# Patient Record
Sex: Female | Born: 1935 | Race: White | Hispanic: No | Marital: Married | Smoking: Never smoker
Health system: Southern US, Community
[De-identification: ages and names within clinical notes are randomized; demographics above are authoritative.]

## PROBLEM LIST (undated history)

## (undated) DIAGNOSIS — M1990 Unspecified osteoarthritis, unspecified site: Secondary | ICD-10-CM

## (undated) DIAGNOSIS — I10 Essential (primary) hypertension: Secondary | ICD-10-CM

## (undated) DIAGNOSIS — E119 Type 2 diabetes mellitus without complications: Secondary | ICD-10-CM

## (undated) DIAGNOSIS — K219 Gastro-esophageal reflux disease without esophagitis: Secondary | ICD-10-CM

## (undated) DIAGNOSIS — E079 Disorder of thyroid, unspecified: Secondary | ICD-10-CM

## (undated) DIAGNOSIS — F32A Depression, unspecified: Secondary | ICD-10-CM

## (undated) DIAGNOSIS — E559 Vitamin D deficiency, unspecified: Secondary | ICD-10-CM

## (undated) DIAGNOSIS — G4730 Sleep apnea, unspecified: Secondary | ICD-10-CM

## (undated) DIAGNOSIS — F329 Major depressive disorder, single episode, unspecified: Secondary | ICD-10-CM

## (undated) DIAGNOSIS — D649 Anemia, unspecified: Secondary | ICD-10-CM

## (undated) HISTORY — PX: CHOLECYSTECTOMY: SHX55

## (undated) HISTORY — PX: BRAIN MENINGIOMA EXCISION: SHX576

## (undated) HISTORY — PX: TONSILLECTOMY: SUR1361

## (undated) HISTORY — PX: TRACHEOSTOMY: SUR1362

---

## 1998-01-24 ENCOUNTER — Other Ambulatory Visit: Admission: RE | Admit: 1998-01-24 | Discharge: 1998-01-24 | Payer: Self-pay | Admitting: Obstetrics and Gynecology

## 1998-06-03 ENCOUNTER — Encounter: Admission: RE | Admit: 1998-06-03 | Discharge: 1998-09-01 | Payer: Self-pay | Admitting: Gynecology

## 1999-02-06 ENCOUNTER — Ambulatory Visit (HOSPITAL_COMMUNITY): Admission: RE | Admit: 1999-02-06 | Discharge: 1999-02-06 | Payer: Self-pay | Admitting: Internal Medicine

## 1999-02-06 ENCOUNTER — Encounter: Payer: Self-pay | Admitting: Internal Medicine

## 1999-03-07 ENCOUNTER — Emergency Department (HOSPITAL_COMMUNITY): Admission: EM | Admit: 1999-03-07 | Discharge: 1999-03-07 | Payer: Self-pay | Admitting: Emergency Medicine

## 1999-03-07 ENCOUNTER — Encounter: Payer: Self-pay | Admitting: Emergency Medicine

## 1999-03-11 ENCOUNTER — Ambulatory Visit (HOSPITAL_COMMUNITY): Admission: RE | Admit: 1999-03-11 | Discharge: 1999-03-11 | Payer: Self-pay | Admitting: Internal Medicine

## 1999-03-11 ENCOUNTER — Encounter: Payer: Self-pay | Admitting: Internal Medicine

## 1999-05-06 ENCOUNTER — Encounter: Payer: Self-pay | Admitting: Internal Medicine

## 1999-05-06 ENCOUNTER — Ambulatory Visit (HOSPITAL_COMMUNITY): Admission: RE | Admit: 1999-05-06 | Discharge: 1999-05-06 | Payer: Self-pay | Admitting: Internal Medicine

## 1999-05-20 ENCOUNTER — Encounter: Admission: RE | Admit: 1999-05-20 | Discharge: 1999-08-18 | Payer: Self-pay | Admitting: Internal Medicine

## 1999-06-10 ENCOUNTER — Encounter: Payer: Self-pay | Admitting: Internal Medicine

## 1999-06-10 ENCOUNTER — Ambulatory Visit (HOSPITAL_COMMUNITY): Admission: RE | Admit: 1999-06-10 | Discharge: 1999-06-10 | Payer: Self-pay | Admitting: Internal Medicine

## 1999-06-28 ENCOUNTER — Emergency Department (HOSPITAL_COMMUNITY): Admission: EM | Admit: 1999-06-28 | Discharge: 1999-06-28 | Payer: Self-pay | Admitting: Emergency Medicine

## 1999-07-10 ENCOUNTER — Encounter: Admission: RE | Admit: 1999-07-10 | Discharge: 1999-09-25 | Payer: Self-pay | Admitting: Internal Medicine

## 1999-10-08 ENCOUNTER — Encounter: Admission: RE | Admit: 1999-10-08 | Discharge: 1999-10-08 | Payer: Self-pay | Admitting: Internal Medicine

## 1999-10-08 ENCOUNTER — Encounter: Payer: Self-pay | Admitting: Obstetrics and Gynecology

## 1999-11-05 ENCOUNTER — Other Ambulatory Visit: Admission: RE | Admit: 1999-11-05 | Discharge: 1999-11-05 | Payer: Self-pay | Admitting: Obstetrics and Gynecology

## 2000-02-02 ENCOUNTER — Encounter: Admission: RE | Admit: 2000-02-02 | Discharge: 2000-02-25 | Payer: Self-pay | Admitting: Internal Medicine

## 2000-04-29 ENCOUNTER — Ambulatory Visit (HOSPITAL_BASED_OUTPATIENT_CLINIC_OR_DEPARTMENT_OTHER): Admission: RE | Admit: 2000-04-29 | Discharge: 2000-04-29 | Payer: Self-pay | Admitting: Otolaryngology

## 2000-06-27 ENCOUNTER — Ambulatory Visit (HOSPITAL_BASED_OUTPATIENT_CLINIC_OR_DEPARTMENT_OTHER): Admission: RE | Admit: 2000-06-27 | Discharge: 2000-06-27 | Payer: Self-pay | Admitting: Otolaryngology

## 2000-11-16 ENCOUNTER — Other Ambulatory Visit: Admission: RE | Admit: 2000-11-16 | Discharge: 2000-11-16 | Payer: Self-pay | Admitting: Obstetrics and Gynecology

## 2000-11-24 ENCOUNTER — Encounter: Admission: RE | Admit: 2000-11-24 | Discharge: 2000-11-24 | Payer: Self-pay | Admitting: Obstetrics and Gynecology

## 2000-11-24 ENCOUNTER — Encounter: Payer: Self-pay | Admitting: Obstetrics and Gynecology

## 2001-08-16 ENCOUNTER — Ambulatory Visit (HOSPITAL_COMMUNITY): Admission: RE | Admit: 2001-08-16 | Discharge: 2001-08-16 | Payer: Self-pay | Admitting: Internal Medicine

## 2001-08-16 ENCOUNTER — Encounter: Payer: Self-pay | Admitting: Internal Medicine

## 2001-11-18 ENCOUNTER — Encounter: Payer: Self-pay | Admitting: Internal Medicine

## 2001-11-18 ENCOUNTER — Encounter: Admission: RE | Admit: 2001-11-18 | Discharge: 2001-11-18 | Payer: Self-pay | Admitting: Internal Medicine

## 2002-01-12 ENCOUNTER — Encounter: Admission: RE | Admit: 2002-01-12 | Discharge: 2002-01-12 | Payer: Self-pay | Admitting: Internal Medicine

## 2002-01-18 ENCOUNTER — Encounter: Admission: RE | Admit: 2002-01-18 | Discharge: 2002-01-18 | Payer: Self-pay | Admitting: Internal Medicine

## 2002-03-09 ENCOUNTER — Other Ambulatory Visit: Admission: RE | Admit: 2002-03-09 | Discharge: 2002-03-09 | Payer: Self-pay | Admitting: Internal Medicine

## 2002-06-07 ENCOUNTER — Ambulatory Visit (HOSPITAL_COMMUNITY): Admission: RE | Admit: 2002-06-07 | Discharge: 2002-06-07 | Payer: Self-pay | Admitting: *Deleted

## 2003-01-04 ENCOUNTER — Encounter: Admission: RE | Admit: 2003-01-04 | Discharge: 2003-01-04 | Payer: Self-pay | Admitting: Internal Medicine

## 2003-01-04 ENCOUNTER — Encounter: Payer: Self-pay | Admitting: Internal Medicine

## 2003-10-03 ENCOUNTER — Encounter: Admission: RE | Admit: 2003-10-03 | Discharge: 2003-10-03 | Payer: Self-pay | Admitting: Neurology

## 2003-11-15 ENCOUNTER — Ambulatory Visit (HOSPITAL_COMMUNITY): Admission: RE | Admit: 2003-11-15 | Discharge: 2003-11-15 | Payer: Self-pay | Admitting: Neurology

## 2003-12-07 ENCOUNTER — Encounter: Admission: RE | Admit: 2003-12-07 | Discharge: 2004-03-06 | Payer: Self-pay | Admitting: Neurology

## 2004-01-21 ENCOUNTER — Ambulatory Visit (HOSPITAL_COMMUNITY): Admission: RE | Admit: 2004-01-21 | Discharge: 2004-01-21 | Payer: Self-pay | Admitting: Internal Medicine

## 2004-10-07 ENCOUNTER — Encounter: Admission: RE | Admit: 2004-10-07 | Discharge: 2004-11-06 | Payer: Self-pay | Admitting: Orthopedic Surgery

## 2005-01-21 ENCOUNTER — Ambulatory Visit (HOSPITAL_COMMUNITY): Admission: RE | Admit: 2005-01-21 | Discharge: 2005-01-21 | Payer: Self-pay | Admitting: Internal Medicine

## 2005-06-02 ENCOUNTER — Ambulatory Visit (HOSPITAL_COMMUNITY): Admission: RE | Admit: 2005-06-02 | Discharge: 2005-06-02 | Payer: Self-pay | Admitting: *Deleted

## 2006-03-11 ENCOUNTER — Ambulatory Visit (HOSPITAL_COMMUNITY): Admission: RE | Admit: 2006-03-11 | Discharge: 2006-03-11 | Payer: Self-pay | Admitting: Internal Medicine

## 2007-03-25 ENCOUNTER — Ambulatory Visit (HOSPITAL_COMMUNITY): Admission: RE | Admit: 2007-03-25 | Discharge: 2007-03-25 | Payer: Self-pay | Admitting: Internal Medicine

## 2007-04-18 ENCOUNTER — Ambulatory Visit (HOSPITAL_COMMUNITY): Admission: RE | Admit: 2007-04-18 | Discharge: 2007-04-18 | Payer: Self-pay | Admitting: Internal Medicine

## 2008-03-20 ENCOUNTER — Encounter: Admission: RE | Admit: 2008-03-20 | Discharge: 2008-05-17 | Payer: Self-pay | Admitting: Internal Medicine

## 2008-05-09 ENCOUNTER — Ambulatory Visit (HOSPITAL_COMMUNITY): Admission: RE | Admit: 2008-05-09 | Discharge: 2008-05-09 | Payer: Self-pay | Admitting: Internal Medicine

## 2008-05-11 ENCOUNTER — Encounter: Admission: RE | Admit: 2008-05-11 | Discharge: 2008-05-11 | Payer: Self-pay | Admitting: Internal Medicine

## 2009-03-08 ENCOUNTER — Ambulatory Visit (HOSPITAL_COMMUNITY): Admission: RE | Admit: 2009-03-08 | Discharge: 2009-03-08 | Payer: Self-pay | Admitting: *Deleted

## 2009-03-12 ENCOUNTER — Ambulatory Visit (HOSPITAL_COMMUNITY): Admission: RE | Admit: 2009-03-12 | Discharge: 2009-03-12 | Payer: Self-pay | Admitting: *Deleted

## 2009-06-13 ENCOUNTER — Ambulatory Visit (HOSPITAL_COMMUNITY): Admission: RE | Admit: 2009-06-13 | Discharge: 2009-06-13 | Payer: Self-pay | Admitting: Internal Medicine

## 2009-06-21 ENCOUNTER — Encounter: Admission: RE | Admit: 2009-06-21 | Discharge: 2009-06-21 | Payer: Self-pay | Admitting: Internal Medicine

## 2010-10-26 ENCOUNTER — Encounter: Payer: Self-pay | Admitting: Internal Medicine

## 2011-01-12 LAB — BASIC METABOLIC PANEL
BUN: 9 mg/dL (ref 6–23)
CO2: 27 mEq/L (ref 19–32)
Calcium: 9 mg/dL (ref 8.4–10.5)
Chloride: 106 mEq/L (ref 96–112)
Creatinine, Ser: 0.7 mg/dL (ref 0.4–1.2)
GFR calc Af Amer: >60 mL/min (ref >60)
GFR calc non Af Amer: >60 mL/min (ref >60)
Glucose, Bld: 111 mg/dL — ABNORMAL HIGH (ref 70–99)
Potassium: 4.5 mEq/L (ref 3.5–5.1)
Sodium: 142 mEq/L (ref 135–145)

## 2011-01-12 LAB — GLUCOSE, CAPILLARY: Glucose-Capillary: 113 mg/dL — ABNORMAL HIGH (ref 70–99)

## 2011-02-17 NOTE — Op Note (Signed)
NAME:  Sonya Garrett, HABERLE NO.:  [id number]   MEDICAL RECORD NO.:  [id number]          PATIENT TYPE:  AMB   LOCATION:  ENDO                         FACILITY:  [facility]   PHYSICIAN:  Georgiana Spinner, M.D.    DATE OF BIRTH:  [birth year]   DATE OF PROCEDURE:  DATE OF DISCHARGE:                               OPERATIVE REPORT   PROCEDURE:  Upper endoscopy.   INDICATIONS:  Abdominal discomfort, abdominal fullness.   ANESTHESIA:  Fentanyl 35 mcg, Versed 3.5 mg.   PROCEDURE:  With the patient mildly sedated in the left lateral  decubitus position the Pentax videoscopic endoscope was inserted in the  mouth and passed under direct vision to the hypopharynx.  I could not  advance it further so I withdrew the endoscope and then subsequently  substituted the Pentax videoscopic pediatric endoscope which I was then  able to advance and pass under direct vision into the esophagus and  advanced distally.  The esophagus appeared normal and we entered into  the stomach.  The fundus and body appeared normal and I was following  the folds to what I thought was originally the pylorus, but it was  stenosed, but in fact this was a large diverticulum in the folds.  I  photographed subsequently only.  I pulled the endoscope up and placed it  in retroflexion.  I viewed the stomach from below and then straightened  and advanced and found the pylorus, then superior to this level and  advanced to duodenal bulb and second portion of the duodenum.  From this  point, the endoscope was slowly withdrawn taking circumferential views  of duodenal mucosa until the endoscope had been pulled back into the  stomach and then subsequently withdrawn taking circumferential views of  the remaining gastric and esophageal mucosa.  The patient's vital signs  and pulse oximeter remained stable.  The patient tolerated the procedure  well with no apparent complication.   FINDINGS:  A large diverticulum in the body and  fundus of the stomach  that goes to the greater curvature, but no mass is seen or ulceration  noted.   IMPRESSION:  A large gastric diverticulum.  We will want to get further  studies, such as possible CAT scan if this has not been done to evaluate  further and have the patient follow-up with me as an outpatient.           ______________________________  Georgiana Spinner, M.D.     GMO/MEDQ  D:  03/08/2009  T:  03/08/2009  Job:  [id number]

## 2011-02-20 NOTE — Op Note (Signed)
NAME:  Sonya Garrett, VROOMAN NO.:  [id number]   MEDICAL RECORD NO.:  [id number]          PATIENT TYPE:  AMB   LOCATION:  ENDO                         FACILITY:  [facility]   PHYSICIAN:  Georgiana Spinner, M.D.    DATE OF BIRTH:  [birth year]   DATE OF PROCEDURE:  06/02/2005  DATE OF DISCHARGE:                                 OPERATIVE REPORT   PROCEDURE:  Colonoscopy.   INDICATIONS:  Rectal bleeding.   ANESTHESIA:  Demerol 60 milligrams, Versed 6 milligrams.   PROCEDURE:  With the patient mildly sedated in the left lateral decubitus  position, the Olympus videoscopic colonoscope PCF-160 was inserted in the  rectum and passed under direct vision to the hepatic flexure. Despite  turning with pressure, we could not advance the scope past this point, so  therefore, it was withdrawn and subsequently, the Olympus videoscopic  colonoscope adult adjustable colonoscope was inserted in the rectum and  passed under direct vision with pressure applied.  We reached the cecum  identified by ileocecal valve and appendiceal orifice, both of which were  photographed.  From this point, the colonoscope was slowly withdrawn taking  circumferential views of colonic mucosa noting that the prep was somewhat  substandard in that there were areas of thick brown liquid stool that had to  be suctioned, but could not be completely suctioned, but it was clearly  brown opaque material, but no gross lesions were seen as we withdrew all the  way to the rectum which appeared normal on direct view showed hemorrhoids on  retroflex view.  The endoscope was straightened and withdrawn through the  anal canal.  Of note, there were external hemorrhoids and skin tags.  __________previously.  The patient's vital signs and pulse oximeter remained  stable. The patient tolerated the procedure well without apparent  complications.   FINDINGS:  Internal and external hemorrhoids, question of a small fissure  seen as well.   Otherwise unremarkable examination limited somewhat by poor  prep.   PLAN:  The patient will follow up with me on an as needed basis.           ______________________________  Georgiana Spinner, M.D.     GMO/MEDQ  D:  06/02/2005  T:  06/02/2005  Job:  [id number]

## 2012-11-15 LAB — TSH: TSH: 1.97 u[IU]/mL (ref <=5.90)

## 2012-11-18 ENCOUNTER — Inpatient Hospital Stay (HOSPITAL_COMMUNITY)
Admission: EM | Admit: 2012-11-18 | Discharge: 2012-11-25 | DRG: 193 | Disposition: A | Discharge status: Nursing Home (Includes ICF) | Payer: Medicare Other | Source: Ambulatory Visit | Attending: Internal Medicine | Admitting: Internal Medicine

## 2012-11-18 ENCOUNTER — Encounter (HOSPITAL_COMMUNITY): Payer: Self-pay | Admitting: Emergency Medicine

## 2012-11-18 DIAGNOSIS — D638 Anemia in other chronic diseases classified elsewhere: Secondary | ICD-10-CM | POA: Diagnosis present

## 2012-11-18 DIAGNOSIS — Z792 Long term (current) use of antibiotics: Secondary | ICD-10-CM

## 2012-11-18 DIAGNOSIS — Z6831 Body mass index (BMI) 31.0-31.9, adult: Secondary | ICD-10-CM

## 2012-11-18 DIAGNOSIS — E669 Obesity, unspecified: Secondary | ICD-10-CM | POA: Diagnosis present

## 2012-11-18 DIAGNOSIS — E1140 Type 2 diabetes mellitus with diabetic neuropathy, unspecified: Secondary | ICD-10-CM | POA: Diagnosis present

## 2012-11-18 DIAGNOSIS — J180 Bronchopneumonia, unspecified organism: Secondary | ICD-10-CM | POA: Diagnosis present

## 2012-11-18 DIAGNOSIS — F3289 Other specified depressive episodes: Secondary | ICD-10-CM | POA: Diagnosis present

## 2012-11-18 DIAGNOSIS — I10 Essential (primary) hypertension: Secondary | ICD-10-CM | POA: Diagnosis present

## 2012-11-18 DIAGNOSIS — M1990 Unspecified osteoarthritis, unspecified site: Secondary | ICD-10-CM | POA: Diagnosis present

## 2012-11-18 DIAGNOSIS — K5900 Constipation, unspecified: Secondary | ICD-10-CM | POA: Diagnosis not present

## 2012-11-18 DIAGNOSIS — J45909 Unspecified asthma, uncomplicated: Secondary | ICD-10-CM | POA: Diagnosis present

## 2012-11-18 DIAGNOSIS — Z9089 Acquired absence of other organs: Secondary | ICD-10-CM

## 2012-11-18 DIAGNOSIS — E559 Vitamin D deficiency, unspecified: Secondary | ICD-10-CM | POA: Diagnosis present

## 2012-11-18 DIAGNOSIS — G4733 Obstructive sleep apnea (adult) (pediatric): Secondary | ICD-10-CM | POA: Diagnosis present

## 2012-11-18 DIAGNOSIS — R5381 Other malaise: Secondary | ICD-10-CM | POA: Diagnosis present

## 2012-11-18 DIAGNOSIS — J189 Pneumonia, unspecified organism: Principal | ICD-10-CM | POA: Diagnosis present

## 2012-11-18 DIAGNOSIS — F329 Major depressive disorder, single episode, unspecified: Secondary | ICD-10-CM | POA: Diagnosis present

## 2012-11-18 DIAGNOSIS — R7401 Elevation of levels of liver transaminase levels: Secondary | ICD-10-CM | POA: Diagnosis present

## 2012-11-18 DIAGNOSIS — K219 Gastro-esophageal reflux disease without esophagitis: Secondary | ICD-10-CM | POA: Diagnosis present

## 2012-11-18 DIAGNOSIS — E876 Hypokalemia: Secondary | ICD-10-CM | POA: Diagnosis not present

## 2012-11-18 DIAGNOSIS — N390 Urinary tract infection, site not specified: Secondary | ICD-10-CM | POA: Diagnosis present

## 2012-11-18 DIAGNOSIS — Z86011 Personal history of benign neoplasm of the brain: Secondary | ICD-10-CM

## 2012-11-18 DIAGNOSIS — F0390 Unspecified dementia without behavioral disturbance: Secondary | ICD-10-CM | POA: Diagnosis present

## 2012-11-18 DIAGNOSIS — J9600 Acute respiratory failure, unspecified whether with hypoxia or hypercapnia: Secondary | ICD-10-CM | POA: Diagnosis present

## 2012-11-18 DIAGNOSIS — D72829 Elevated white blood cell count, unspecified: Secondary | ICD-10-CM | POA: Diagnosis present

## 2012-11-18 DIAGNOSIS — Z79899 Other long term (current) drug therapy: Secondary | ICD-10-CM

## 2012-11-18 DIAGNOSIS — E039 Hypothyroidism, unspecified: Secondary | ICD-10-CM | POA: Diagnosis present

## 2012-11-18 DIAGNOSIS — R7402 Elevation of levels of lactic acid dehydrogenase (LDH): Secondary | ICD-10-CM | POA: Diagnosis present

## 2012-11-18 DIAGNOSIS — E119 Type 2 diabetes mellitus without complications: Secondary | ICD-10-CM | POA: Diagnosis present

## 2012-11-18 HISTORY — DX: Vitamin D deficiency, unspecified: E55.9

## 2012-11-18 HISTORY — DX: Type 2 diabetes mellitus without complications: E11.9

## 2012-11-18 HISTORY — DX: Depression, unspecified: F32.A

## 2012-11-18 HISTORY — DX: Anemia, unspecified: D64.9

## 2012-11-18 HISTORY — DX: Gastro-esophageal reflux disease without esophagitis: K21.9

## 2012-11-18 HISTORY — DX: Major depressive disorder, single episode, unspecified: F32.9

## 2012-11-18 HISTORY — DX: Essential (primary) hypertension: I10

## 2012-11-18 HISTORY — DX: Unspecified osteoarthritis, unspecified site: M19.90

## 2012-11-18 HISTORY — DX: Sleep apnea, unspecified: G47.30

## 2012-11-18 HISTORY — DX: Disorder of thyroid, unspecified: E07.9

## 2012-11-18 LAB — URINALYSIS, ROUTINE W REFLEX MICROSCOPIC
Glucose, UA: 250 mg/dL — AB
Ketones, ur: 15 mg/dL — AB
Nitrite: NEGATIVE
Protein, ur: 30 mg/dL — AB
Specific Gravity, Urine: 1.023 (ref 1.005–1.030)
Urobilinogen, UA: 1 mg/dL (ref 0.0–1.0)
pH: 5.5 (ref 5.0–8.0)

## 2012-11-18 LAB — URINE MICROSCOPIC-ADD ON

## 2012-11-18 MED ORDER — SODIUM CHLORIDE 0.9 % IV SOLN: INTRAVENOUS | Status: DC

## 2012-11-18 NOTE — ED Notes (Signed)
Patient from [facility] at [place].  Per EMS SNF staff sent patient to ED for uti after drawing lab work, patient was also hyperglycemic 287 with EMS and a fever of 100.4.  Patient received 650 of tylenol PO at SNF.  Patient is denying abdominal pain at this time.

## 2012-11-19 ENCOUNTER — Emergency Department (HOSPITAL_COMMUNITY): Payer: Medicare Other

## 2012-11-19 ENCOUNTER — Encounter (HOSPITAL_COMMUNITY): Payer: Self-pay | Admitting: Internal Medicine

## 2012-11-19 DIAGNOSIS — J189 Pneumonia, unspecified organism: Principal | ICD-10-CM

## 2012-11-19 DIAGNOSIS — D72829 Elevated white blood cell count, unspecified: Secondary | ICD-10-CM | POA: Diagnosis present

## 2012-11-19 DIAGNOSIS — E1140 Type 2 diabetes mellitus with diabetic neuropathy, unspecified: Secondary | ICD-10-CM | POA: Diagnosis present

## 2012-11-19 DIAGNOSIS — E119 Type 2 diabetes mellitus without complications: Secondary | ICD-10-CM

## 2012-11-19 DIAGNOSIS — I10 Essential (primary) hypertension: Secondary | ICD-10-CM | POA: Diagnosis present

## 2012-11-19 DIAGNOSIS — E669 Obesity, unspecified: Secondary | ICD-10-CM | POA: Diagnosis present

## 2012-11-19 DIAGNOSIS — N390 Urinary tract infection, site not specified: Secondary | ICD-10-CM

## 2012-11-19 LAB — CBC WITH DIFFERENTIAL/PLATELET
Eosinophils Absolute: 0 10*3/uL (ref 0.0–0.7)
HCT: 32.3 % — ABNORMAL LOW (ref 36.0–46.0)
Hemoglobin: 11 g/dL — ABNORMAL LOW (ref 12.0–15.0)
Lymphs Abs: 0.4 10*3/uL — ABNORMAL LOW (ref 0.7–4.0)
MCH: 28.4 pg (ref 26.0–34.0)
Monocytes Relative: 2 % — ABNORMAL LOW (ref 3–12)
Neutro Abs: 11.4 10*3/uL — ABNORMAL HIGH (ref 1.7–7.7)
Neutrophils Relative %: 94 % — ABNORMAL HIGH (ref 43–77)
RBC: 3.87 MIL/uL (ref 3.87–5.11)

## 2012-11-19 LAB — GLUCOSE, CAPILLARY
Glucose-Capillary: 145 mg/dL — ABNORMAL HIGH (ref 70–99)
Glucose-Capillary: 171 mg/dL — ABNORMAL HIGH (ref 70–99)

## 2012-11-19 LAB — COMPREHENSIVE METABOLIC PANEL WITH GFR
ALT: 187 U/L — ABNORMAL HIGH (ref 0–35)
AST: 171 U/L — ABNORMAL HIGH (ref 0–37)
Albumin: 2.8 g/dL — ABNORMAL LOW (ref 3.5–5.2)
Alkaline Phosphatase: 165 U/L — ABNORMAL HIGH (ref 39–117)
BUN: 20 mg/dL (ref 6–23)
CO2: 25 meq/L (ref 19–32)
Calcium: 8.5 mg/dL (ref 8.4–10.5)
Chloride: 98 meq/L (ref 96–112)
Creatinine, Ser: 0.86 mg/dL (ref 0.50–1.10)
GFR calc Af Amer: 71 mL/min — ABNORMAL LOW
GFR calc non Af Amer: 62 mL/min — ABNORMAL LOW
Glucose, Bld: 181 mg/dL — ABNORMAL HIGH (ref 70–99)
Potassium: 3.8 meq/L (ref 3.5–5.1)
Sodium: 135 meq/L (ref 135–145)
Total Bilirubin: 0.6 mg/dL (ref 0.3–1.2)
Total Protein: 6.7 g/dL (ref 6.0–8.3)

## 2012-11-19 LAB — CBC
Hemoglobin: 10.5 g/dL — ABNORMAL LOW (ref 12.0–15.0)
MCH: 27.8 pg (ref 26.0–34.0)
MCV: 83.1 fL (ref 78.0–100.0)
Platelets: 169 10*3/uL (ref 150–400)
RBC: 3.78 MIL/uL — ABNORMAL LOW (ref 3.87–5.11)

## 2012-11-19 LAB — BASIC METABOLIC PANEL
CO2: 26 mEq/L (ref 19–32)
Calcium: 8.4 mg/dL (ref 8.4–10.5)
Glucose, Bld: 70 mg/dL (ref 70–99)
Sodium: 136 mEq/L (ref 135–145)

## 2012-11-19 MED ORDER — METFORMIN HCL 500 MG PO TABS
500.0000 mg | ORAL_TABLET | Freq: Two times a day (BID) | ORAL | Status: DC
  Filled 2012-11-19 (×3): qty 1

## 2012-11-19 MED ORDER — ENOXAPARIN SODIUM 40 MG/0.4ML ~~LOC~~ SOLN
40.0000 mg | SUBCUTANEOUS | Status: DC
  Administered 2012-11-19 – 2012-11-24 (×6): 40 mg via SUBCUTANEOUS
  Filled 2012-11-19 (×7): qty 0.4

## 2012-11-19 MED ORDER — SODIUM CHLORIDE 0.9 % IV SOLN: INTRAVENOUS | Status: DC

## 2012-11-19 MED ORDER — FLUOXETINE HCL 20 MG PO CAPS: 20.0000 mg | ORAL_CAPSULE | Freq: Every day | ORAL | Status: DC

## 2012-11-19 MED ORDER — MEMANTINE HCL 10 MG PO TABS
10.0000 mg | ORAL_TABLET | Freq: Every day | ORAL | Status: DC
  Administered 2012-11-19 – 2012-11-25 (×7): 10 mg via ORAL
  Filled 2012-11-19 (×7): qty 1

## 2012-11-19 MED ORDER — PREGABALIN 75 MG PO CAPS
75.0000 mg | ORAL_CAPSULE | Freq: Three times a day (TID) | ORAL | Status: DC
  Administered 2012-11-19 – 2012-11-25 (×19): 75 mg via ORAL
  Filled 2012-11-19 (×19): qty 1

## 2012-11-19 MED ORDER — PANTOPRAZOLE SODIUM 40 MG PO TBEC
40.0000 mg | DELAYED_RELEASE_TABLET | Freq: Every day | ORAL | Status: DC
  Administered 2012-11-19 – 2012-11-25 (×7): 40 mg via ORAL
  Filled 2012-11-19 (×7): qty 1

## 2012-11-19 MED ORDER — ONDANSETRON HCL 4 MG PO TABS: 4.0000 mg | ORAL_TABLET | Freq: Four times a day (QID) | ORAL | Status: DC | PRN

## 2012-11-19 MED ORDER — ALBUTEROL SULFATE (5 MG/ML) 0.5% IN NEBU
2.5000 mg | INHALATION_SOLUTION | Freq: Four times a day (QID) | RESPIRATORY_TRACT | Status: DC
  Administered 2012-11-19 (×4): 2.5 mg via RESPIRATORY_TRACT
  Filled 2012-11-19 (×4): qty 0.5

## 2012-11-19 MED ORDER — GLIPIZIDE 5 MG PO TABS
5.0000 mg | ORAL_TABLET | Freq: Every day | ORAL | Status: DC
  Filled 2012-11-19 (×2): qty 1

## 2012-11-19 MED ORDER — ALBUTEROL SULFATE (5 MG/ML) 0.5% IN NEBU
2.5000 mg | INHALATION_SOLUTION | Freq: Three times a day (TID) | RESPIRATORY_TRACT | Status: DC
  Administered 2012-11-20 – 2012-11-22 (×7): 2.5 mg via RESPIRATORY_TRACT
  Filled 2012-11-19 (×6): qty 0.5

## 2012-11-19 MED ORDER — ACETAMINOPHEN 650 MG RE SUPP: 650.0000 mg | Freq: Four times a day (QID) | RECTAL | Status: DC | PRN

## 2012-11-19 MED ORDER — LEVOTHYROXINE SODIUM 50 MCG PO TABS
50.0000 ug | ORAL_TABLET | Freq: Every day | ORAL | Status: DC
  Administered 2012-11-19 – 2012-11-25 (×7): 50 ug via ORAL
  Filled 2012-11-19 (×8): qty 1

## 2012-11-19 MED ORDER — INSULIN ASPART 100 UNIT/ML ~~LOC~~ SOLN
0.0000 [IU] | Freq: Three times a day (TID) | SUBCUTANEOUS | Status: DC
  Administered 2012-11-19: 1 [IU] via SUBCUTANEOUS
  Administered 2012-11-20: 2 [IU] via SUBCUTANEOUS
  Administered 2012-11-20: 1 [IU] via SUBCUTANEOUS
  Administered 2012-11-20 – 2012-11-21 (×2): 2 [IU] via SUBCUTANEOUS
  Administered 2012-11-21 (×2): 1 [IU] via SUBCUTANEOUS
  Administered 2012-11-22: 5 [IU] via SUBCUTANEOUS
  Administered 2012-11-22: 3 [IU] via SUBCUTANEOUS
  Administered 2012-11-22: 1 [IU] via SUBCUTANEOUS
  Administered 2012-11-23: 7 [IU] via SUBCUTANEOUS
  Administered 2012-11-23: 9 [IU] via SUBCUTANEOUS
  Administered 2012-11-23: 7 [IU] via SUBCUTANEOUS
  Administered 2012-11-24: 2 [IU] via SUBCUTANEOUS
  Administered 2012-11-24: 1 [IU] via SUBCUTANEOUS
  Administered 2012-11-24: 5 [IU] via SUBCUTANEOUS
  Administered 2012-11-25: 3 [IU] via SUBCUTANEOUS
  Administered 2012-11-25: 2 [IU] via SUBCUTANEOUS

## 2012-11-19 MED ORDER — ONDANSETRON HCL 4 MG/2ML IJ SOLN: 4.0000 mg | Freq: Four times a day (QID) | INTRAMUSCULAR | Status: DC | PRN

## 2012-11-19 MED ORDER — OXYCODONE HCL 5 MG PO TABS: 5.0000 mg | ORAL_TABLET | ORAL | Status: DC | PRN

## 2012-11-19 MED ORDER — DEXTROSE 5 % IV SOLN
500.0000 mg | INTRAVENOUS | Status: DC
  Administered 2012-11-19 – 2012-11-23 (×5): 500 mg via INTRAVENOUS
  Filled 2012-11-19 (×6): qty 500

## 2012-11-19 MED ORDER — BIOTENE DRY MOUTH MT LIQD
15.0000 mL | Freq: Two times a day (BID) | OROMUCOSAL | Status: DC
  Administered 2012-11-19 – 2012-11-25 (×10): 15 mL via OROMUCOSAL

## 2012-11-19 MED ORDER — DEXTROSE 5 % IV SOLN
1.0000 g | INTRAVENOUS | Status: DC
  Administered 2012-11-19 – 2012-11-25 (×7): 1 g via INTRAVENOUS
  Filled 2012-11-19 (×7): qty 10

## 2012-11-19 MED ORDER — ACETAMINOPHEN 325 MG PO TABS
650.0000 mg | ORAL_TABLET | Freq: Four times a day (QID) | ORAL | Status: DC | PRN
  Administered 2012-11-24 – 2012-11-25 (×2): 650 mg via ORAL
  Filled 2012-11-19 (×2): qty 2

## 2012-11-19 MED ORDER — ZOLPIDEM TARTRATE 5 MG PO TABS
5.0000 mg | ORAL_TABLET | Freq: Every evening | ORAL | Status: DC | PRN
  Administered 2012-11-20: 5 mg via ORAL
  Filled 2012-11-19: qty 1

## 2012-11-19 MED ORDER — HYDROMORPHONE HCL PF 1 MG/ML IJ SOLN: 0.5000 mg | INTRAMUSCULAR | Status: DC | PRN

## 2012-11-19 MED ORDER — SODIUM CHLORIDE 0.9 % IV SOLN
INTRAVENOUS | Status: DC
  Administered 2012-11-19: 02:00:00 via INTRAVENOUS
  Administered 2012-11-20: 10 mL/h via INTRAVENOUS
  Administered 2012-11-22: 11:00:00 via INTRAVENOUS

## 2012-11-19 MED ORDER — LEVOFLOXACIN IN D5W 750 MG/150ML IV SOLN: 750.0000 mg | INTRAVENOUS | Status: DC

## 2012-11-19 MED ORDER — LEVOFLOXACIN IN D5W 750 MG/150ML IV SOLN
750.0000 mg | INTRAVENOUS | Status: DC
  Administered 2012-11-19: 750 mg via INTRAVENOUS
  Filled 2012-11-19: qty 150

## 2012-11-19 MED ORDER — INSULIN ASPART 100 UNIT/ML ~~LOC~~ SOLN
0.0000 [IU] | Freq: Every day | SUBCUTANEOUS | Status: DC
  Administered 2012-11-22 – 2012-11-23 (×2): 3 [IU] via SUBCUTANEOUS
  Administered 2012-11-24: 2 [IU] via SUBCUTANEOUS

## 2012-11-19 MED ORDER — ALBUTEROL SULFATE (5 MG/ML) 0.5% IN NEBU
2.5000 mg | INHALATION_SOLUTION | RESPIRATORY_TRACT | Status: DC | PRN
  Administered 2012-11-20: 2.5 mg via RESPIRATORY_TRACT
  Filled 2012-11-19 (×2): qty 0.5

## 2012-11-19 NOTE — ED Notes (Signed)
[phone] Marcella Dubs (daughter) Please call when patient gets bed placement.

## 2012-11-19 NOTE — H&P (Signed)
[facility] History and Physical  EMANII BUGBEE ZOX:[id number] DOB: 27-Feb-1929 DOA: 11/18/2012  Referring physician:  EDP PCP: Kimber Relic, MD  Specialists:   Chief Complaint: Sick, nauseous, and weak  HPI: Sonya Garrett is a 79 y.o. female resident of [facility] who was sent to the ED with complaints of feeling sick, nauseous and weak over the past 4 days.   She reports having a cough and chest congestion which has worsened over the past week, and she was diagnosed with a UTI and placed on outpatient therapy of Macrobid, and then started on Cipro 1 day ago.   She does report having fevers and chills.   In the ED she was  Found to have a developing pneumonia on chest X-ray, and her urinalysis was found to be positive as well.  She was placed on IV Levaquin and referred for admission.      Review of Systems: The patient denies anorexia, weight loss, vision loss, decreased hearing, hoarseness, chest pain, syncope, dyspnea on exertion, peripheral edema, balance deficits, hemoptysis, abdominal pain, diarrhea, constipation, hematemesis, melena, hematochezia, severe indigestion/heartburn, hematuria, incontinence, muscle weakness, suspicious skin lesions, transient blindness, depression, unusual weight change, abnormal bleeding, enlarged lymph nodes, angioedema, and breast masses.    Past Medical History  Diagnosis Date  . Thyroid disease hypotyroidism  . Diabetes mellitus without complication   . Anemia   . Depression   . Vitamin D deficiency   . Sleep apnea   . Hypertension   . Osteoarthritis   . GERD (gastroesophageal reflux disease)     Past Surgical History:         Resection of Meningioma, and S/P Tracheostomy      Cholecystectomy      Tonsillectomy   Medications:  HOME MEDS: Prior to Admission medications   Medication Sig Start Date End Date Taking? Authorizing Provider  acetaminophen (TYLENOL) 325 MG tablet Take 650 mg by mouth every 6 (six) hours as needed  for pain.   Yes Historical Provider, MD  calcium carbonate (TUMS - DOSED IN MG ELEMENTAL CALCIUM) 500 MG chewable tablet Chew 1 tablet by mouth 2 (two) times daily as needed for heartburn.   Yes Historical Provider, MD  Cholecalciferol (VITAMIN D-3) 1000 UNITS CAPS Take 1,000 Units by mouth daily.   Yes Historical Provider, MD  ciprofloxacin (CIPRO) 500 MG tablet Take 500 mg by mouth 2 (two) times daily. Started on 11-18-12;  7 day course of therapy   Yes Historical Provider, MD  FLUoxetine (PROZAC) 20 MG/5ML solution Take 20 mg by mouth every evening.   Yes Historical Provider, MD  glipiZIDE (GLUCOTROL) 5 MG tablet Take 5 mg by mouth 2 (two) times daily before a meal.   Yes Historical Provider, MD  hydroxypropyl methylcellulose (ISOPTO TEARS) 2.5 % ophthalmic solution Place 1 drop into both eyes 2 (two) times daily.   Yes Historical Provider, MD  levothyroxine (SYNTHROID, LEVOTHROID) 50 MCG tablet Take 50 mcg by mouth daily.   Yes Historical Provider, MD  memantine (NAMENDA) 10 MG tablet Take 10 mg by mouth 2 (two) times daily.   Yes Historical Provider, MD  metFORMIN (GLUCOPHAGE) 500 MG tablet Take 500 mg by mouth 2 (two) times daily with a meal.   Yes Historical Provider, MD  nystatin (MYCOSTATIN/NYSTOP) 100000 UNIT/GM POWD Apply 1 g topically 2 (two) times daily as needed (applied to skin folds twice daily as needed for rash).   Yes Historical Provider, MD  nystatin-triamcinolone (MYCOLOG II) cream Apply  1 application topically at bedtime. To perineal area   Yes Historical Provider, MD  omeprazole (PRILOSEC) 20 MG capsule Take 20 mg by mouth daily.   Yes Historical Provider, MD  polyethylene glycol (MIRALAX / GLYCOLAX) packet Take 17 g by mouth daily.   Yes Historical Provider, MD  pregabalin (LYRICA) 75 MG capsule Take 75 mg by mouth at bedtime.   Yes Historical Provider, MD  promethazine (PHENERGAN) 25 MG tablet Take 25 mg by mouth every 4 (four) hours as needed for nausea.   Yes Historical  Provider, MD  saccharomyces boulardii (FLORASTOR) 250 MG capsule Take 250 mg by mouth 2 (two) times daily.   Yes Historical Provider, MD  senna-docusate (SENOKOT-S) 8.6-50 MG per tablet Take 2 tablets by mouth every other day.   Yes Historical Provider, MD  vitamin B-12 (CYANOCOBALAMIN) 1000 MCG tablet Take 1,000 mcg by mouth every evening.   Yes Historical Provider, MD    Allergies:  Allergies  Allergen Reactions  . Codeine Phosphate     unknown  . Darvocet (Propoxyphene-Acetaminophen)     unknown  . Ibuprofen     unknown  . Prozac (Fluoxetine Hcl)     unknown  . Reglan (Metoclopramide)     unknown  . Tofranil (Imipramine Hcl)     unknown    Social History:  Lives at the [facility].    reports that she has never smoked. She has never used smokeless tobacco. She reports that she does not drink alcohol or use illicit drugs.  Family History:            Diabetes in Mother   Physical Exam:  GEN:  Pleasant Elderly Obese 79 year old Caucasian Female examined  and in no acute distress; cooperative with exam Filed Vitals:   11/18/12 2214 11/18/12 2308  BP: 108/48 109/46  Pulse: 73 73  Temp: 100.8 F (38.2 C)   TempSrc: Oral   Resp: 16   SpO2: 94% 95%   Blood pressure 109/46, pulse 73, temperature 100.8 F (38.2 C), temperature source Oral, resp. rate 16, SpO2 95.00%. PSYCH: She is alert and oriented x4; does not appear anxious does not appear depressed; affect is normal HEENT: Normocephalic and Atraumatic, Mucous membranes pink; PERRLA; EOM intact; Fundi:  Benign;  No scleral icterus, Nares: Patent, Oropharynx: Clear, Poor Dentition, Neck:   +Trachestomy scar.  FROM, no cervical lymphadenopathy nor thyromegaly or carotid bruit; no JVD; Breasts:: Not examined CHEST WALL: No tenderness CHEST: Normal respiration, diffuse Rhonchi Bilaterally No rales No wheezes.   HEART: Regular rate and rhythm; no murmurs rubs or gallops BACK: No kyphosis or scoliosis; no CVA  tenderness ABDOMEN: Positive Bowel Sounds,Obese, soft non-tender; no masses, no organomegaly, no pannus; no intertriginous candida. Rectal Exam: Not done EXTREMITIES: No bone or joint deformity; age-appropriate arthropathy of the hands and knees; no cyanosis, clubbing or edema; no ulcerations. Genitalia: not examined PULSES: 2+ and symmetric SKIN: Normal hydration no rash or ulceration CNS: Cranial nerves 2-12 grossly intact no focal neurologic deficit    Labs on Admission:  Basic Metabolic Panel:  Recent Labs Lab 11/18/12 2321  NA 135  K 3.8  CL 98  CO2 25  GLUCOSE 181*  BUN 20  CREATININE 0.86  CALCIUM 8.5   Liver Function Tests:  Recent Labs Lab 11/18/12 2321  AST 171*  ALT 187*  ALKPHOS 165*  BILITOT 0.6  PROT 6.7  ALBUMIN 2.8*   No results found for this basename: LIPASE, AMYLASE,  in the  last 168 hours No results found for this basename: AMMONIA,  in the last 168 hours CBC:  Recent Labs Lab 11/18/12 2321  WBC 12.1*  NEUTROABS 11.4*  HGB 11.0*  HCT 32.3*  MCV 83.5  PLT 194   Cardiac Enzymes: No results found for this basename: CKTOTAL, CKMB, CKMBINDEX, TROPONINI,  in the last 168 hours  BNP (last 3 results) No results found for this basename: PROBNP,  in the last 8760 hours CBG: No results found for this basename: GLUCAP,  in the last 168 hours  Radiological Exams on Admission: Dg Chest 2 View  11/19/2012  *RADIOLOGY REPORT*  Clinical Data: Fever, nausea and headache; urinary tract infection. History of diabetes.  CHEST - 2 VIEW  Comparison: None.  Findings: The lungs are well expanded.  Left basilar airspace opacification raises concern for pneumonia.  Mild underlying vascular congestion is seen.  A small left pleural effusion is suspected.  No pneumothorax is seen.  The heart is borderline normal in size; calcification is noted within the aortic arch.  No acute osseous abnormalities are seen.  IMPRESSION: Left basilar airspace opacification raises  concern for pneumonia; suspect small left pleural effusion.  Mild underlying vascular congestion seen.   Original Report Authenticated By: Tonia Ghent, M.D.       Assessment/Plan Principal Problem:   CAP (community acquired pneumonia) Active Problems:   UTI (lower urinary tract infection)   Diabetes mellitus   Hypertension   Leukocytosis   Obesity    1.   CAP-   IV Levaquin, Nebs O2.    2.   UTI-  Covered with IV Levaquin, adjust abx pending Urine C+S results.     3.   DM2-  SSI coverage PRN.  Continue Glipizide.     4.   HTN-  Hx but not on hTN meds at this time, Monitor.     5.   Leukocytosis-  Due to #1 and # 2,  Monitor trend.    6.    Obesity-  Chronic, and stable.    7.   Other-   Reconcile Medications.       Code Status:  FULL CODE Family Communication:  No Family Present Disposition Plan:  Return to [facility] on Discharge  Time spent:  16 Minutes  Ron Parker [facility] Pager [phone]  If 7PM-7AM, please contact night-coverage [email or web address] Password [email or web address] 11/19/2012, 2:20 AM

## 2012-11-19 NOTE — ED Provider Notes (Signed)
History     CSN: [id number]  Arrival date & time 11/18/12  2153   First MD Initiated Contact with Patient 11/18/12 2155      Chief Complaint  Patient presents with  . Urinary Tract Infection    (Consider location/radiation/quality/duration/timing/severity/associated sxs/prior treatment) Patient is a 79 y.o. female presenting with urinary tract infection. The history is provided by the patient, the spouse and medical records.  Urinary Tract Infection This is a new problem. The current episode started more than 2 days ago (Pt says that she has been sick for 4 days, with painful urination, headach, and temperature to 102.  ). The problem occurs constantly. The problem has not changed since onset.Associated symptoms include headaches. Nothing aggravates the symptoms. Nothing relieves the symptoms. Treatments tried: She has had a course of macrodantin and was started on Cipro today.    Past Medical History  Diagnosis Date  . Thyroid disease hypotyroidism  . Diabetes mellitus without complication   . Anemia   . Depression   . Vitamin D deficiency   . Sleep apnea   . Hypertension   . Osteoarthritis   . GERD (gastroesophageal reflux disease)     History reviewed. No pertinent past surgical history.  No family history on file.  History  Substance Use Topics  . Smoking status: Never Smoker   . Smokeless tobacco: Never Used  . Alcohol Use: No    OB History   Grav Para Term Preterm Abortions TAB SAB Ect Mult Living                  Review of Systems  Constitutional: Negative for fever and chills.  Eyes: Negative.   Respiratory: Negative.   Cardiovascular: Negative.   Gastrointestinal: Negative.   Genitourinary: Positive for dysuria.  Musculoskeletal: Negative.   Skin: Negative.   Neurological: Positive for headaches.  Psychiatric/Behavioral: Negative.     Allergies  Codeine phosphate; Darvocet; Ibuprofen; Prozac; Reglan; and Tofranil  Home Medications   Current  Outpatient Rx  Name  Route  Sig  Dispense  Refill  . acetaminophen (TYLENOL) 325 MG tablet   Oral   Take 650 mg by mouth every 6 (six) hours as needed for pain.         . calcium carbonate (TUMS - DOSED IN MG ELEMENTAL CALCIUM) 500 MG chewable tablet   Oral   Chew 1 tablet by mouth 2 (two) times daily as needed for heartburn.         . Cholecalciferol (VITAMIN D-3) 1000 UNITS CAPS   Oral   Take 1,000 Units by mouth daily.         . ciprofloxacin (CIPRO) 500 MG tablet   Oral   Take 500 mg by mouth 2 (two) times daily. Started on 11-18-12;  7 day course of therapy         . FLUoxetine (PROZAC) 20 MG/5ML solution   Oral   Take 20 mg by mouth every evening.         Marland Kitchen glipiZIDE (GLUCOTROL) 5 MG tablet   Oral   Take 5 mg by mouth 2 (two) times daily before a meal.         . hydroxypropyl methylcellulose (ISOPTO TEARS) 2.5 % ophthalmic solution   Both Eyes   Place 1 drop into both eyes 2 (two) times daily.         Marland Kitchen levothyroxine (SYNTHROID, LEVOTHROID) 50 MCG tablet   Oral   Take 50 mcg by mouth daily.         Marland Kitchen  memantine (NAMENDA) 10 MG tablet   Oral   Take 10 mg by mouth 2 (two) times daily.         . metFORMIN (GLUCOPHAGE) 500 MG tablet   Oral   Take 500 mg by mouth 2 (two) times daily with a meal.         . nystatin (MYCOSTATIN/NYSTOP) 100000 UNIT/GM POWD   Topical   Apply 1 g topically 2 (two) times daily as needed (applied to skin folds twice daily as needed for rash).         . nystatin-triamcinolone (MYCOLOG II) cream   Topical   Apply 1 application topically at bedtime. To perineal area         . omeprazole (PRILOSEC) 20 MG capsule   Oral   Take 20 mg by mouth daily.         . polyethylene glycol (MIRALAX / GLYCOLAX) packet   Oral   Take 17 g by mouth daily.         . pregabalin (LYRICA) 75 MG capsule   Oral   Take 75 mg by mouth at bedtime.         . promethazine (PHENERGAN) 25 MG tablet   Oral   Take 25 mg by mouth  every 4 (four) hours as needed for nausea.         Marland Kitchen saccharomyces boulardii (FLORASTOR) 250 MG capsule   Oral   Take 250 mg by mouth 2 (two) times daily.         Marland Kitchen senna-docusate (SENOKOT-S) 8.6-50 MG per tablet   Oral   Take 2 tablets by mouth every other day.         . vitamin B-12 (CYANOCOBALAMIN) 1000 MCG tablet   Oral   Take 1,000 mcg by mouth every evening.           BP 109/46  Pulse 73  Temp(Src) 100.8 F (38.2 C) (Oral)  Resp 16  SpO2 95%  Physical Exam  Nursing note and vitals reviewed. Constitutional: She is oriented to person, place, and time. She appears well-developed and well-nourished. No distress.  T 100.8.   HENT:  Head: Normocephalic and atraumatic.  Right Ear: External ear normal.  Left Ear: External ear normal.  Mouth/Throat: Oropharynx is clear and moist.  Eyes: Conjunctivae and EOM are normal. Pupils are equal, round, and reactive to light.  Neck: Normal range of motion. Neck supple.  Cardiovascular: Normal rate, regular rhythm and normal heart sounds.   Pulmonary/Chest: Effort normal and breath sounds normal.  Abdominal: Soft. Bowel sounds are normal.  Musculoskeletal: She exhibits edema.  She has 2+ bilateral ankle and foot edema.  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  Needs a Hoyer lift for transfers.  Neurological: She is alert and oriented to person, place, and time.  No sensory or motor deficit.  Skin: Skin is warm and dry.  Psychiatric: She has a normal mood and affect. Her behavior is normal.    ED Course  Procedures (including critical care time)  12:56 AM  Date: 11/19/2012  Rate: 69  Rhythm: normal sinus rhythm  QRS Axis: normal  Intervals: normal  ST/T Wave abnormalities: normal  Conduction Disutrbances:none  Narrative Interpretation: Normal EKG  Old EKG Reviewed: none available  Results for orders placed during the hospital encounter of 11/18/12  URINALYSIS, ROUTINE W REFLEX  MICROSCOPIC      Result Value Range   Color, Urine  AMBER (*) YELLOW   APPearance TURBID (*) CLEAR   Specific Gravity, Urine 1.023  1.005 - 1.030   pH 5.5  5.0 - 8.0   Glucose, UA 250 (*) NEGATIVE mg/dL   Hgb urine dipstick SMALL (*) NEGATIVE   Bilirubin Urine SMALL (*) NEGATIVE   Ketones, ur 15 (*) NEGATIVE mg/dL   Protein, ur 30 (*) NEGATIVE mg/dL   Urobilinogen, UA 1.0  0.0 - 1.0 mg/dL   Nitrite NEGATIVE  NEGATIVE   Leukocytes, UA LARGE (*) NEGATIVE  URINE MICROSCOPIC-ADD ON      Result Value Range   Squamous Epithelial / LPF MANY (*) RARE   WBC, UA 21-50  <3 WBC/hpf   RBC / HPF 0-2  <3 RBC/hpf   Bacteria, UA MANY (*) RARE  CBC WITH DIFFERENTIAL      Result Value Range   WBC 12.1 (*) 4.0 - 10.5 K/uL   RBC 3.87  3.87 - 5.11 MIL/uL   Hemoglobin 11.0 (*) 12.0 - 15.0 g/dL   HCT 37.9 (*) 02.4 - 09.7 %   MCV 83.5  78.0 - 100.0 fL   MCH 28.4  26.0 - 34.0 pg   MCHC 34.1  30.0 - 36.0 g/dL   RDW 35.3  29.9 - 24.2 %   Platelets 194  150 - 400 K/uL   Neutrophils Relative 94 (*) 43 - 77 %   Neutro Abs 11.4 (*) 1.7 - 7.7 K/uL   Lymphocytes Relative 4 (*) 12 - 46 %   Lymphs Abs 0.4 (*) 0.7 - 4.0 K/uL   Monocytes Relative 2 (*) 3 - 12 %   Monocytes Absolute 0.3  0.1 - 1.0 K/uL   Eosinophils Relative 0  0 - 5 %   Eosinophils Absolute 0.0  0.0 - 0.7 K/uL   Basophils Relative 0  0 - 1 %   Basophils Absolute 0.0  0.0 - 0.1 K/uL  COMPREHENSIVE METABOLIC PANEL      Result Value Range   Sodium 135  135 - 145 mEq/L   Potassium 3.8  3.5 - 5.1 mEq/L   Chloride 98  96 - 112 mEq/L   CO2 25  19 - 32 mEq/L   Glucose, Bld 181 (*) 70 - 99 mg/dL   BUN 20  6 - 23 mg/dL   Creatinine, Ser 6.83  0.50 - 1.10 mg/dL   Calcium 8.5  8.4 - 41.9 mg/dL   Total Protein 6.7  6.0 - 8.3 g/dL   Albumin 2.8 (*) 3.5 - 5.2 g/dL   AST 622 (*) 0 - 37 U/L   ALT 187 (*) 0 - 35 U/L   Alkaline Phosphatase 165 (*) 39 - 117 U/L   Total Bilirubin 0.6  0.3 - 1.2 mg/dL   GFR calc non Af Amer 62 (*) >90 mL/min   GFR calc  Af Amer 71 (*) >90 mL/min   Dg Chest 2 View  11/19/2012  *RADIOLOGY REPORT*  Clinical Data: Fever, nausea and headache; urinary tract infection. History of diabetes.  CHEST - 2 VIEW  Comparison: None.  Findings: The lungs are well expanded.  Left basilar airspace opacification raises concern for pneumonia.  Mild underlying vascular congestion is seen.  A small left pleural effusion is suspected.  No pneumothorax is seen.  The heart is borderline normal in size; calcification is noted within the aortic arch.  No acute osseous abnormalities are seen.  IMPRESSION: Left basilar airspace opacification raises concern for pneumonia; suspect small left pleural effusion.  Mild underlying  vascular congestion seen.   Original Report Authenticated By: Tonia Ghent, M.D.     Lab workup shows CBC with WBC 12,100, chemistries with glucose elevated at 181, and UA showing 21-50 WBC.  Chest x-ray raised question of possible LLL pneumonia.  Will request [facility] to admit her for UTI not responding to outpatient treatment.  1:20 AM Case discussed with Della Goo, M.D. Admit to [facility] to a med-surg bed, Rx with IV Levaquin.   1. Urinary tract infection   2. Diabetes mellitus             Carleene Cooper III, MD 11/19/12 [phone]

## 2012-11-20 LAB — GLUCOSE, CAPILLARY
Glucose-Capillary: 128 mg/dL — ABNORMAL HIGH (ref 70–99)
Glucose-Capillary: 166 mg/dL — ABNORMAL HIGH (ref 70–99)

## 2012-11-20 MED ORDER — SENNOSIDES-DOCUSATE SODIUM 8.6-50 MG PO TABS
2.0000 | ORAL_TABLET | Freq: Two times a day (BID) | ORAL | Status: DC
  Administered 2012-11-20 – 2012-11-25 (×11): 2 via ORAL
  Filled 2012-11-20 (×10): qty 2
  Filled 2012-11-20: qty 1

## 2012-11-20 NOTE — Progress Notes (Signed)
[facility] PROGRESS NOTE  Sonya Garrett MVH:[id number] DOB: [birth year] DOA: 11/18/2012  PCP: Kimber Relic, MD  Brief HPI: Sonya Garrett is a 79 y.o. female resident of [facility] who was sent to the ED with complaints of feeling sick, nauseous and weak over the past 4 days. She reports having a cough and chest congestion which has worsened over the past week, and she was diagnosed with a UTI and placed on outpatient therapy of Macrobid, and then started on Cipro 1 day ago. She does report having fevers and chills. In the ED she was Found to have a developing pneumonia on chest X-ray, and her urinalysis was found to be positive as well. She was placed on IV Levaquin and referred for admission.   Past Medical History  Diagnosis Date  . Thyroid disease hypotyroidism  . Diabetes mellitus without complication   . Anemia   . Depression   . Vitamin D deficiency   . Sleep apnea   . Hypertension   . Osteoarthritis   . GERD (gastroesophageal reflux disease)    Consultants: None  Procedures: None  Antibiotics: Rocephin 2/15 Azithromycin 2/15  Subjective: Patient feels much better then when she came in. She was eating breakfast at the time of my visit. She has not had a BM since last 2-3 days. She has not been out of bed in last 2-3 days. She has not been very functional at home either. She wants to go home if discharged after being stable.  Objective: Vital Signs  Filed Vitals:   11/20/12 0142 11/20/12 0209 11/20/12 0516 11/20/12 0858  BP: 158/76  153/87   Pulse: 80  75   Temp: 99 F (37.2 C)  98.1 F (36.7 C)   TempSrc: Oral  Oral   Resp: 18  18   SpO2: 96% 97% 92% 92%    Intake/Output Summary (Last 24 hours) at 11/20/12 0912 Last data filed at 11/20/12 0700  Gross per 24 hour  Intake 2153.75 ml  Output    200 ml  Net 1953.75 ml   There were no vitals filed for this visit.  Intake/Output from previous day: 02/15 0701 - 02/16 0700 In: 2153.8  [I.V.:2153.8] Out: 200 [Urine:200] Physical Exam: General: Vital signs reviewed and noted. Well-developed, well-nourished, in no acute distress; alert, appropriate and cooperative throughout examination.  Head: Normocephalic, atraumatic.  Eyes: PERRL, EOMI, No signs of anemia or jaundince.  Nose: Mucous membranes moist, not inflammed, nonerythematous.  Throat: Tracheostomy scar noted, Oropharynx nonerythematous, no exudate appreciated.   Neck: No deformities, masses, or tenderness noted.Supple, No carotid Bruits, no JVD.  Lungs:  Normal respiratory effort. Clear to auscultation BL without crackles or wheezes.  Heart: RRR. S1 and S2 normal without gallop, murmur, or rubs.  Abdomen:  BS normoactive. Soft, Nondistended, non-tender.  No masses or organomegaly.  Extremities: No pretibial edema.  Neurologic: A&O X3, CN II - XII are grossly intact. Motor strength is 5/5 in the all 4 extremities, Sensations intact to light touch, Cerebellar signs negative.  Skin: No visible rashes, scars.     Lab Results:  Basic Metabolic Panel:  Recent Labs Lab 11/18/12 2321 11/19/12 0630  NA 135 136  K 3.8 3.5  CL 98 100  CO2 25 26  GLUCOSE 181* 70  BUN 20 16  CREATININE 0.86 0.83  CALCIUM 8.5 8.4   Liver Function Tests:  Recent Labs Lab 11/18/12 2321  AST 171*  ALT 187*  ALKPHOS 165*  BILITOT 0.6  PROT 6.7  ALBUMIN 2.8*   CBC:  Recent Labs Lab 11/18/12 2321 11/19/12 0630  WBC 12.1* 7.2  NEUTROABS 11.4*  --   HGB 11.0* 10.5*  HCT 32.3* 31.4*  MCV 83.5 83.1  PLT 194 169   CBG:  Recent Labs Lab 11/19/12 0736 11/19/12 1208 11/19/12 1712 11/19/12 2251 11/20/12 0801  GLUCAP 78 145* 101* 171* 128*    Recent Results (from the past 240 hour(s))  MRSA PCR SCREENING     Status: None   Collection Time    11/19/12  4:05 AM      Result Value Range Status   MRSA by PCR NEGATIVE  NEGATIVE Final   Comment:            The GeneXpert MRSA Assay (FDA     approved for NASAL  specimens     only), is one component of a     comprehensive MRSA colonization     surveillance program. It is not     intended to diagnose MRSA     infection nor to guide or     monitor treatment for     MRSA infections.      Studies/Results: Dg Chest 2 View  11/19/2012  *RADIOLOGY REPORT*  Clinical Data: Fever, nausea and headache; urinary tract infection. History of diabetes.  CHEST - 2 VIEW  Comparison: None.  Findings: The lungs are well expanded.  Left basilar airspace opacification raises concern for pneumonia.  Mild underlying vascular congestion is seen.  A small left pleural effusion is suspected.  No pneumothorax is seen.  The heart is borderline normal in size; calcification is noted within the aortic arch.  No acute osseous abnormalities are seen.  IMPRESSION: Left basilar airspace opacification raises concern for pneumonia; suspect small left pleural effusion.  Mild underlying vascular congestion seen.   Original Report Authenticated By: Tonia Ghent, M.D.     Medications: I have reviewed the patient's current medications.  Assessment/Plan:  Principal Problem:   CAP (community acquired pneumonia) Active Problems:   UTI (lower urinary tract infection)   Diabetes mellitus   Hypertension   Leukocytosis   Obesity  1. CAP- IV ceftraixone and azithromycin day 2 of theraoy, Nebs O2. Patient seems to be improving well on current therapy. Plan to change to oral therapy tomorrow. -Patient is severely deconditioned which is contributing to her current status. I will have PT work with her and evaluate need for rehab.    2. UTI- Covered with IV ceftriaxone, adjust abx pending Urine C+S results.   3. DM2- SSI coverage PRN. Glipizide DC'ed yesterday while inpatient.  4. HTN- Hx but not on hTN meds at this time, Monitor.   5. Leukocytosis- Due to #1 and # 2, Monitor trend.   6. Obesity- Chronic, and stable.   7. Other- Reconcile Medications.   8. Constipation: Start  sennakot s and colace while inpatient.  Code Status: Full  DVT Prophylaxis  Family Communication: No family member present, She was updated about the plan  Disposition Plan: Patient wants to go home   Time spent: 40   LOS: 2 days   Lars Mage  [facility] Pager [phone] 11/20/2012, 9:12 AM  If 8PM-8AM, please contact night-coverage at [email or web address], password [facility]

## 2012-11-21 LAB — GLUCOSE, CAPILLARY
Glucose-Capillary: 138 mg/dL — ABNORMAL HIGH (ref 70–99)
Glucose-Capillary: 148 mg/dL — ABNORMAL HIGH (ref 70–99)
Glucose-Capillary: 132 mg/dL — ABNORMAL HIGH (ref 70–99)

## 2012-11-21 LAB — CBC WITH DIFFERENTIAL/PLATELET
Eosinophils Absolute: 0.1 10*3/uL (ref 0.0–0.7)
Eosinophils Relative: 2 % (ref 0–5)
HCT: 32.5 % — ABNORMAL LOW (ref 36.0–46.0)
Lymphocytes Relative: 13 % (ref 12–46)
Lymphs Abs: 0.9 10*3/uL (ref 0.7–4.0)
MCH: 27.9 pg (ref 26.0–34.0)
MCV: 84 fL (ref 78.0–100.0)
Monocytes Absolute: 0.5 10*3/uL (ref 0.1–1.0)
Platelets: 182 10*3/uL (ref 150–400)
RBC: 3.87 MIL/uL (ref 3.87–5.11)
WBC: 7 10*3/uL (ref 4.0–10.5)

## 2012-11-21 LAB — BASIC METABOLIC PANEL
BUN: 9 mg/dL (ref 6–23)
CO2: 27 mEq/L (ref 19–32)
Calcium: 8.7 mg/dL (ref 8.4–10.5)
Creatinine, Ser: 0.72 mg/dL (ref 0.50–1.10)
GFR calc non Af Amer: 78 mL/min — ABNORMAL LOW (ref >90)
Glucose, Bld: 135 mg/dL — ABNORMAL HIGH (ref 70–99)
Sodium: 137 mEq/L (ref 135–145)

## 2012-11-21 LAB — URINE CULTURE: Colony Count: >=100000

## 2012-11-21 MED ORDER — FUROSEMIDE 10 MG/ML IJ SOLN
20.0000 mg | Freq: Once | INTRAMUSCULAR | Status: AC
  Administered 2012-11-21: 20 mg via INTRAVENOUS
  Filled 2012-11-21: qty 2

## 2012-11-21 MED ORDER — GUAIFENESIN-DM 100-10 MG/5ML PO SYRP
5.0000 mL | ORAL_SOLUTION | ORAL | Status: DC | PRN
  Filled 2012-11-21: qty 5

## 2012-11-21 NOTE — Progress Notes (Signed)
PT Cancellation Note  Patient Details Name: Sonya Garrett MRN: [id number] DOB: 1929-04-27   Cancelled Treatment:    Reason Eval/Treat Not Completed: Other (comment) (Family to bring in pt's w/c for her to sit in.)   [facility] 11/21/2012, 2:06 PM  [facility] PT [phone]

## 2012-11-21 NOTE — Progress Notes (Signed)
CrCl >50 ml/min.  Antibiotic dosing OK.  Pharmacy will continue to monitor progress. Thanks for allowing pharmacy to be a part of this patient's care.  Talbert Cage, PharmD Clinical Pharmacist, [phone]

## 2012-11-21 NOTE — Progress Notes (Signed)
Patient ID: Sonya Garrett, female   DOB: August 09, 1929, 79 y.o.   MRN: [id number]  [facility] PROGRESS NOTE  Sonya Garrett:[id number] DOB: 29-Jan-1929 DOA: 11/18/2012 PCP: Kimber Relic, MD  Brief narrative: Pt is 79 y.o. female resident of [facility] who came to ED with complaints of feeling sick, nauseous and weak over the past 4 days. She reports having a cough and chest congestion which has worsened over the past week, and she was diagnosed with a UTI and placed on outpatient therapy of Macrobid, and then started on Cipro 1 day prior to this admission. She does report having fevers and chills. In the ED she was found to have a developing pneumonia on chest X-ray, and her urinalysis was found to be positive as well. She was placed on IV Levaquin and referred for admission.   Principal Problem:   CAP (community acquired pneumonia) - slight clinical improvement since admission per pt  - will continue supportive care for now, IV antibiotics, robitussin as needed - obtain sputum analysis, urine legionella and strep pneumo - oxygen via [place] Active Problems:   UTI (lower urinary tract infection) - urine culture still pending - abx noted above should cover   Diabetes mellitus - reasonable control in the hospital - readjust the regimen as indicated   Hypertension - monitor vitals per floor protocol - may need to add PRN coverage in SBP persistently above 150    Leukocytosis - secondary to principal problem - now stable and within normal limits    Obesity - nutrition consultation   Consultants:  None  Procedures/Studies:  No results found.  Antibiotics:  Rocephin 2/15 -->  Zithromax 2/15 -->  Code Status: Full Family Communication: Pt, daughter at bedside Disposition Plan: Home when medically stable  HPI/Subjective: No events overnight.   Objective: Filed Vitals:   11/20/12 2147 11/21/12 0205 11/21/12 0521 11/21/12 1008  BP:  150/80 147/73 148/63  Pulse:   78 77 78  Temp:  98.8 F (37.1 C) 98.4 F (36.9 C) 98.3 F (36.8 C)  TempSrc:  Axillary Oral Oral  Resp:  18 18 18   Height:      Weight:   179 lb 14.3 oz (81.6 kg)   SpO2: 93% 94% 91% 94%    Intake/Output Summary (Last 24 hours) at 11/21/12 1342 Last data filed at 11/21/12 0915  Gross per 24 hour  Intake    360 ml  Output      0 ml  Net    360 ml    Exam:   General:  Pt is alert, follows commands appropriately, not in acute distress  Cardiovascular: Regular rate and rhythm, S1/S2, no murmurs, no rubs, no gallops  Respiratory: Course breath sounds with rales bilaterally and crackles at bases   Abdomen: Soft, non tender, non distended, bowel sounds present, no guarding  Extremities: Trace bilateral pitting edema, pulses DP and PT palpable bilaterally  Neuro: Grossly nonfocal  Data Reviewed: Basic Metabolic Panel:  Recent Labs Lab 11/18/12 2321 11/19/12 0630 11/21/12 0551  NA 135 136 137  K 3.8 3.5 3.8  CL 98 100 101  CO2 25 26 27   GLUCOSE 181* 70 135*  BUN 20 16 9   CREATININE 0.86 0.83 0.72  CALCIUM 8.5 8.4 8.7   Liver Function Tests:  Recent Labs Lab 11/18/12 2321  AST 171*  ALT 187*  ALKPHOS 165*  BILITOT 0.6  PROT 6.7  ALBUMIN 2.8*   CBC:  Recent Labs Lab 11/18/12 2321  11/19/12 0630 11/21/12 0551  WBC 12.1* 7.2 7.0  NEUTROABS 11.4*  --  5.4  HGB 11.0* 10.5* 10.8*  HCT 32.3* 31.4* 32.5*  MCV 83.5 83.1 84.0  PLT 194 169 182   CBG:  Recent Labs Lab 11/20/12 1224 11/20/12 1712 11/20/12 2106 11/21/12 0751 11/21/12 1203  GLUCAP 166* 168* 145* 138* 148*    Recent Results (from the past 240 hour(s))  URINE CULTURE     Status: None   Collection Time    11/18/12 10:00 PM      Result Value Range Status   Specimen Description URINE, CATHETERIZED   Final   Special Requests NONE   Final   Culture  Setup Time 11/19/2012 05:37   Final   Colony Count >=100,000 COLONIES/ML   Final   Culture     Final   Value: LACTOBACILLUS SPECIES      Note: Standardized susceptibility testing for this organism is not available.   Report Status 11/21/2012 FINAL   Final  MRSA PCR SCREENING     Status: None   Collection Time    11/19/12  4:05 AM      Result Value Range Status   MRSA by PCR NEGATIVE  NEGATIVE Final   Comment:            The GeneXpert MRSA Assay (FDA     approved for NASAL specimens     only), is one component of a     comprehensive MRSA colonization     surveillance program. It is not     intended to diagnose MRSA     infection nor to guide or     monitor treatment for     MRSA infections.  CULTURE, BLOOD (ROUTINE X 2)     Status: None   Collection Time    11/19/12 10:25 AM      Result Value Range Status   Specimen Description BLOOD THUMB LEFT   Final   Special Requests BOTTLES DRAWN AEROBIC ONLY 3.0CC    Final   Culture  Setup Time 11/19/2012 18:00   Final   Culture     Final   Value:        BLOOD CULTURE RECEIVED NO GROWTH TO DATE CULTURE WILL BE HELD FOR 5 DAYS BEFORE ISSUING A FINAL NEGATIVE REPORT   Report Status PENDING   Incomplete  CULTURE, BLOOD (ROUTINE X 2)     Status: None   Collection Time    11/19/12 10:34 AM      Result Value Range Status   Specimen Description BLOOD ARM LEFT   Final   Special Requests BOTTLES DRAWN AEROBIC ONLY 8CC   Final   Culture  Setup Time 11/19/2012 18:00   Final   Culture     Final   Value:        BLOOD CULTURE RECEIVED NO GROWTH TO DATE CULTURE WILL BE HELD FOR 5 DAYS BEFORE ISSUING A FINAL NEGATIVE REPORT   Report Status PENDING   Incomplete     Scheduled Meds: . albuterol  2.5 mg Nebulization TID  . azithromycin  500 mg Intravenous Q24H  . cefTRIAXone  IV  1 g Intravenous Q24H  . enoxaparin injection  40 mg Subcutaneous Q24H  . insulin aspart  0-5 Units Subcutaneous QHS  . insulin aspart  0-9 Units Subcutaneous TID WC  . levothyroxine  50 mcg Oral QAC breakfast  . memantine  10 mg Oral Daily  . pantoprazole  40 mg Oral Daily  .  pregabalin  75 mg Oral TID  .  senna-docusate  2 tablet Oral BID   Continuous Infusions: . sodium chloride 10 mL/hr (11/20/12 0945)   Debbora Presto, MD  [facility] Pager [phone]  If 7PM-7AM, please contact night-coverage [email or web address] Password [email or web address] 11/21/2012, 1:42 PM   LOS: 3 days

## 2012-11-22 ENCOUNTER — Inpatient Hospital Stay (HOSPITAL_COMMUNITY): Payer: Medicare Other

## 2012-11-22 ENCOUNTER — Encounter (HOSPITAL_COMMUNITY): Payer: Self-pay | Admitting: Radiology

## 2012-11-22 LAB — CBC
HCT: 32.8 % — ABNORMAL LOW (ref 36.0–46.0)
Hemoglobin: 10.9 g/dL — ABNORMAL LOW (ref 12.0–15.0)
MCHC: 33.2 g/dL (ref 30.0–36.0)
RBC: 3.9 MIL/uL (ref 3.87–5.11)
WBC: 6.9 10*3/uL (ref 4.0–10.5)

## 2012-11-22 LAB — BASIC METABOLIC PANEL
CO2: 28 mEq/L (ref 19–32)
Chloride: 101 mEq/L (ref 96–112)
GFR calc non Af Amer: 78 mL/min — ABNORMAL LOW (ref >90)
Glucose, Bld: 149 mg/dL — ABNORMAL HIGH (ref 70–99)
Potassium: 3.8 mEq/L (ref 3.5–5.1)
Sodium: 140 mEq/L (ref 135–145)

## 2012-11-22 LAB — BLOOD GAS, ARTERIAL
Acid-Base Excess: 3 mmol/L — ABNORMAL HIGH (ref 0.0–2.0)
Bicarbonate: 27.4 mEq/L — ABNORMAL HIGH (ref 20.0–24.0)
Patient temperature: 98.6
TCO2: 28.8 mmol/L (ref 0–100)
pH, Arterial: 7.397 (ref 7.350–7.450)

## 2012-11-22 LAB — GLUCOSE, CAPILLARY
Glucose-Capillary: 146 mg/dL — ABNORMAL HIGH (ref 70–99)
Glucose-Capillary: 236 mg/dL — ABNORMAL HIGH (ref 70–99)
Glucose-Capillary: 271 mg/dL — ABNORMAL HIGH (ref 70–99)

## 2012-11-22 LAB — INFLUENZA PANEL BY PCR (TYPE A & B): Influenza B By PCR: NEGATIVE

## 2012-11-22 MED ORDER — IPRATROPIUM BROMIDE 0.02 % IN SOLN
0.5000 mg | RESPIRATORY_TRACT | Status: DC
  Administered 2012-11-22 – 2012-11-23 (×9): 0.5 mg via RESPIRATORY_TRACT
  Filled 2012-11-22 (×9): qty 2.5

## 2012-11-22 MED ORDER — OSELTAMIVIR PHOSPHATE 75 MG PO CAPS
75.0000 mg | ORAL_CAPSULE | Freq: Two times a day (BID) | ORAL | Status: DC
  Administered 2012-11-22 – 2012-11-23 (×3): 75 mg via ORAL
  Filled 2012-11-22 (×4): qty 1

## 2012-11-22 MED ORDER — ALBUTEROL SULFATE (5 MG/ML) 0.5% IN NEBU
2.5000 mg | INHALATION_SOLUTION | RESPIRATORY_TRACT | Status: DC
  Administered 2012-11-22 – 2012-11-23 (×8): 2.5 mg via RESPIRATORY_TRACT
  Filled 2012-11-22 (×9): qty 0.5

## 2012-11-22 MED ORDER — METHYLPREDNISOLONE SODIUM SUCC 125 MG IJ SOLR
125.0000 mg | Freq: Four times a day (QID) | INTRAMUSCULAR | Status: DC
  Administered 2012-11-22 – 2012-11-23 (×5): 125 mg via INTRAVENOUS
  Filled 2012-11-22 (×10): qty 2

## 2012-11-22 NOTE — Clinical Social Work Note (Signed)
Clinical Social Work Department BRIEF PSYCHOSOCIAL ASSESSMENT 11/22/2012  Patient:  Sonya Garrett, Sonya Garrett     Account Number:  [id number]     Admit date:  11/18/2012  Clinical Social Worker:  Johnsie Cancel  Date/Time:  11/22/2012 03:16 PM  Referred by:  Physician  Date Referred:  11/21/2012 Referred for  ALF Placement   Other Referral:   Interview type:  Patient Other interview type:    PSYCHOSOCIAL DATA Living Status:  FACILITY Admitted from facility:  [facility] Level of care:  Assisted Living Primary support name:  Jonny Ruiz [phone]) Primary support relationship to patient:  SPOUSE Degree of support available:   Unknown.    CURRENT CONCERNS Current Concerns  Post-Acute Placement   Other Concerns:    SOCIAL WORK ASSESSMENT / PLAN CSW consulted re: patient being from a facility. CSW met with patient at bedside to discuss discharge planning. Patient stated she was from [facility], and would like to return at discharge. CSW provided support and will continue to follow this patient for discharge.   Assessment/plan status:  Information/Referral to [facility] Other assessment/ plan:   Information/referral to community resources:    PATIENT'S/FAMILY'S RESPONSE TO PLAN OF CARE: Patient thanked CSW for assisting in discharge and providing support.   [name], LCSWA [facility] Clinical Social Worker Contact #: [phone]

## 2012-11-22 NOTE — Evaluation (Signed)
Physical Therapy Evaluation Patient Details Name: Sonya Garrett MRN: [id number] DOB: 1929/05/17 Today's Date: 11/22/2012 Time: 4098-1191 PT Time Calculation (min): 4 min  PT Assessment / Plan / Recommendation Clinical Impression  Pt adm with PNA.  Pt uses lift at SNF for OOB.  Thought pt used hoyer lift but on further discussion discovered pt used standing type lift.  Will follow for acute PT to continue to work on bed mobility and transfers with [place] [facility] so pt will be able to use same methods when she returns to SNF.    PT Assessment  Patient needs continued PT services    Follow Up Recommendations  No PT follow up    Does the patient have the potential to tolerate intense rehabilitation      Barriers to Discharge        Equipment Recommendations  None recommended by PT    Recommendations for Other Services     Frequency Min 2X/week    Precautions / Restrictions Precautions Precautions: Fall   Pertinent Vitals/Pain Lt knee pain due to arthritis      Mobility  Bed Mobility Bed Mobility: Rolling Right;Rolling Left Rolling Right: 3: Mod assist;With rail Rolling Left: 2: Max assist;With Engineer, drilling    Exercises     PT Diagnosis: Generalized weakness  PT Problem List: Decreased mobility PT Treatment Interventions: DME instruction;Functional mobility training;Therapeutic activities;Balance training;Patient/family education   PT Goals Acute Rehab PT Goals PT Goal Formulation: With patient Time For Goal Achievement: 11/29/12 Potential to Achieve Goals: Good Pt will Roll Supine to Right Side: with min assist PT Goal: Rolling Supine to Right Side - Progress: Goal set today Pt will Roll Supine to Left Side: with mod assist PT Goal: Rolling Supine to Left Side - Progress: Goal set today Pt will go Supine/Side to Sit: with mod assist PT Goal: Supine/Side to Sit - Progress: Goal set today Pt will Sit at Edge of Bed: with  supervision;3-5 min PT Goal: Sit at Edge Of Bed - Progress: Goal set today Pt will go Sit to Supine/Side: with mod assist PT Goal: Sit to Supine/Side - Progress: Goal set today Pt will Transfer Bed to Chair/Chair to Bed: with +2 total assist (with use of Sara Plus) PT Transfer Goal: Bed to Chair/Chair to Bed - Progress: Goal set today  Visit Information  Last PT Received On: 11/22/12 Assistance Needed: +2    Subjective Data      Prior Functioning  Home Living Available Help at Discharge: Skilled Nursing Facility Type of Home: Skilled Nursing Facility Home Adaptive Equipment: Wheelchair - manual Prior Function Level of Independence: Needs assistance Needs Assistance: Bathing;Dressing;Toileting;Transfers Transfer Assistance: Pt uses lift at SNF.  Thought this way hoyer type lift but after eval family described standing lift (like Sara Plus) Communication Communication: HOH    Cognition  Cognition Overall Cognitive Status: Appears within functional limits for tasks assessed/performed Arousal/Alertness: Awake/alert Orientation Level: Appears intact for tasks assessed Behavior During Session: [facility] for tasks performed    Extremity/Trunk Assessment Right Lower Extremity Assessment RLE ROM/Strength/Tone: Deficits RLE ROM/Strength/Tone Deficits: grossly <3/5 Left Lower Extremity Assessment LLE ROM/Strength/Tone: Deficits LLE ROM/Strength/Tone Deficits: grossly <3/5   Balance    End of Session PT - End of Session Equipment Utilized During Treatment: Other (comment);Oxygen (maximove) Activity Tolerance: Patient tolerated treatment well Patient left: in chair;with call bell/phone within reach;with family/visitor present Nurse Communication: Mobility status;Need for lift equipment  GP     [facility] 11/22/2012, 4:21 PM  [employer] PT [phone]

## 2012-11-22 NOTE — Progress Notes (Addendum)
Patient ID: Sonya Garrett, female   DOB: [birth year], 79 y.o.   MRN: [id number]  [facility] PROGRESS NOTE  Sonya Garrett WUJ:[id number] DOB: 1929/05/11 DOA: 11/18/2012 PCP: Kimber Relic, MD  Brief narrative:  Pt is 79 y.o. female resident of [facility] who came to ED with complaints of feeling sick, nauseous and weak over the past 4 days. She reports having a cough and chest congestion which has worsened over the past week, and she was diagnosed with a UTI and placed on outpatient therapy of Macrobid, and then started on Cipro 1 day prior to this admission. She does report having fevers and chills, but endorsed some dysuria and urinary urgency. Per family member she has some urinary incontinence . In the ED she was found to have a developing pneumonia on chest X-ray, and her urinalysis was found to be positive as well. She was placed on IV Levaquin and referred for admission.   Principal Problem:  Acute respiratory failure - this is likely multifactorial in etiology and secondary to CAP, ? Flu, > CHF component - lung sounds still rather course but stable oxygen saturations - 2 D ECHO pending, CT chest ordered today - will also check ABG and H1N1 panel - add empiric Tamiflu to the medication regimen and discontinue if not needed CAP (community acquired pneumonia)  - slight clinical improvement since admission but on exam, lung sounds still course whit significant wheezing - will proceed with obtaining CT chest for further evaluation - add Atrovent nebulizer to the regimen and add albuterol as needed, pt is already on scheduled dose of Albuterol - will also add Solumedrol IV for now and plan on tapering down as clinically indicated  - will continue supportive care for now, IV antibiotics, robitussin as needed  - obtained sputum analysis, urine legionella and strep pneumo and the results still pending - oxygen via [place]  - the treatment was discussed in detail with daughter at  bedside Active Problems:  UTI (lower urinary tract infection)  - urine culture with Lactobacillus - will repeat Urine culture as this could be a contaminant and pt has had symptoms on admission - abx noted above should cover  Diabetes mellitus type II - reasonable control in the hospital  - A1C is 6.6 11/21/2012 - readjust the regimen as indicated as we are adding Solumedrol to the regimen Transaminitis - unclear etiology and will have to repeat CMET in AM - this could be potentially viral in etiology - pt has no abdominal concerns except nausea - liver imagining may be needed for further evaluation Hypertension  - reasonable inpatient control - continue to monitor vitals per floor protocol Leukocytosis  - secondary to principal problem  - now stable and within normal limits  Obesity  - nutrition consultation  Anemia of chronic disease - Hg and Hct stable but slightly down from yesterday - still at pt's baseline  - CBC In AM  Consultants:  None Procedures/Studies:  No results found. Antibiotics:  Rocephin 2/15 -->  Zithromax 2/15 --> Tamiflu 2/18 -->  Code Status: Full  Family Communication: Pt, daughter at bedside  Disposition Plan: Will need PT evaluation prior to discharge. Family is worried, especially with multiple tests done and today we discussed ordering Influenza panel, CT chest but we may need abdominal imaging due to transaminitis. Will hold off on that for now. Family would appreciate daily updates. Daughter is the best contact, Zella Ball [phone]. Discussed the potential need for PCCM consult if no  significant improvement.   HPI/Subjective: No events overnight. Pt denies chest pain or abdominal pain. Has cough and finally more productive this AM, yellowish to greenish in color.  Objective: Filed Vitals:   11/22/12 9604 11/22/12 0742 11/22/12 0900 11/22/12 0947  BP: 133/75   121/67  Pulse: 69   75  Temp: 97.7 F (36.5 C)   98.6 F (37 C)  TempSrc:  Axillary   Axillary  Resp: 18   18  Height:      Weight:      SpO2: 94% 97% 94% 92%    Intake/Output Summary (Last 24 hours) at 11/22/12 1151 Last data filed at 11/22/12 1012  Gross per 24 hour  Intake   1500 ml  Output      0 ml  Net   1500 ml    Exam:   General:  Pt is alert, follows commands appropriately, not in acute distress  Cardiovascular: Regular rate and rhythm, S1/S2, no murmurs, no rubs, no gallops  Respiratory: Course breath sounds and with inspiratory and expiratory wheezing, pt coughing during the exam, difficult to bring out  Abdomen: Soft, non tender, non distended, bowel sounds present, no guarding  Extremities: No edema, pulses DP and PT palpable bilaterally  Neuro: Grossly nonfocal  Data Reviewed: Basic Metabolic Panel:  Recent Labs Lab 11/18/12 2321 11/19/12 0630 11/21/12 0551 11/22/12 0600  NA 135 136 137 140  K 3.8 3.5 3.8 3.8  CL 98 100 101 101  CO2 25 26 27 28   GLUCOSE 181* 70 135* 149*  BUN 20 16 9 9   CREATININE 0.86 0.83 0.72 0.72  CALCIUM 8.5 8.4 8.7 8.7   Liver Function Tests:  Recent Labs Lab 11/18/12 2321  AST 171*  ALT 187*  ALKPHOS 165*  BILITOT 0.6  PROT 6.7  ALBUMIN 2.8*   CBC:  Recent Labs Lab 11/18/12 2321 11/19/12 0630 11/21/12 0551 11/22/12 0600  WBC 12.1* 7.2 7.0 6.9  NEUTROABS 11.4*  --  5.4  --   HGB 11.0* 10.5* 10.8* 10.9*  HCT 32.3* 31.4* 32.5* 32.8*  MCV 83.5 83.1 84.0 84.1  PLT 194 169 182 184   CBG:  Recent Labs Lab 11/21/12 0751 11/21/12 1203 11/21/12 1701 11/21/12 2230 11/22/12 0746  GLUCAP 138* 148* 156* 132* 146*    Recent Results (from the past 240 hour(s))  URINE CULTURE     Status: None   Collection Time    11/18/12 10:00 PM      Result Value Range Status   Specimen Description URINE, CATHETERIZED   Final   Special Requests NONE   Final   Culture  Setup Time 11/19/2012 05:37   Final   Colony Count >=100,000 COLONIES/ML   Final   Culture     Final   Value:  LACTOBACILLUS SPECIES     Note: Standardized susceptibility testing for this organism is not available.   Report Status 11/21/2012 FINAL   Final  MRSA PCR SCREENING     Status: None   Collection Time    11/19/12  4:05 AM      Result Value Range Status   MRSA by PCR NEGATIVE  NEGATIVE Final   Comment:            The GeneXpert MRSA Assay (FDA     approved for NASAL specimens     only), is one component of a     comprehensive MRSA colonization     surveillance program. It is not     intended  to diagnose MRSA     infection nor to guide or     monitor treatment for     MRSA infections.  CULTURE, BLOOD (ROUTINE X 2)     Status: None   Collection Time    11/19/12 10:25 AM      Result Value Range Status   Specimen Description BLOOD THUMB LEFT   Final   Special Requests BOTTLES DRAWN AEROBIC ONLY 3.0CC    Final   Culture  Setup Time 11/19/2012 18:00   Final   Culture     Final   Value:        BLOOD CULTURE RECEIVED NO GROWTH TO DATE CULTURE WILL BE HELD FOR 5 DAYS BEFORE ISSUING A FINAL NEGATIVE REPORT   Report Status PENDING   Incomplete  CULTURE, BLOOD (ROUTINE X 2)     Status: None   Collection Time    11/19/12 10:34 AM      Result Value Range Status   Specimen Description BLOOD ARM LEFT   Final   Special Requests BOTTLES DRAWN AEROBIC ONLY 8CC   Final   Culture  Setup Time 11/19/2012 18:00   Final   Culture     Final   Value:        BLOOD CULTURE RECEIVED NO GROWTH TO DATE CULTURE WILL BE HELD FOR 5 DAYS BEFORE ISSUING A FINAL NEGATIVE REPORT   Report Status PENDING   Incomplete     Scheduled Meds: . albuterol  2.5 mg Nebulization Q4H  . antiseptic oral rinse  15 mL Mouth Rinse BID  . azithromycin  500 mg Intravenous Q24H  . cefTRIAXone (ROCEPHIN)  IV  1 g Intravenous Q24H  . enoxaparin (LOVENOX) injection  40 mg Subcutaneous Q24H  . insulin aspart  0-5 Units Subcutaneous QHS  . insulin aspart  0-9 Units Subcutaneous TID WC  . ipratropium  0.5 mg Nebulization Q4H  .  levothyroxine  50 mcg Oral QAC breakfast  . memantine  10 mg Oral Daily  . methylPREDNISolone (SOLU-MEDROL) injection  125 mg Intravenous Q6H  . pantoprazole  40 mg Oral Daily  . pregabalin  75 mg Oral TID  . senna-docusate  2 tablet Oral BID   Continuous Infusions: . sodium chloride 10 mL/hr at 11/22/12 1053     Sonya Presto, MD  [facility] Pager [phone]  If 7PM-7AM, please contact night-coverage [email or web address] Password [facility] 11/22/2012, 11:51 AM   LOS: 4 days

## 2012-11-22 NOTE — Progress Notes (Signed)
  Echocardiogram 2D Echocardiogram has been performed.  Sonya Garrett 11/22/2012, 1:02 PM

## 2012-11-23 DIAGNOSIS — R112 Nausea with vomiting, unspecified: Secondary | ICD-10-CM

## 2012-11-23 DIAGNOSIS — J9600 Acute respiratory failure, unspecified whether with hypoxia or hypercapnia: Secondary | ICD-10-CM | POA: Diagnosis present

## 2012-11-23 LAB — COMPREHENSIVE METABOLIC PANEL
BUN: 14 mg/dL (ref 6–23)
CO2: 26 mEq/L (ref 19–32)
Calcium: 8.6 mg/dL (ref 8.4–10.5)
Creatinine, Ser: 0.63 mg/dL (ref 0.50–1.10)
GFR calc Af Amer: >90 mL/min (ref >90)
GFR calc non Af Amer: 82 mL/min — ABNORMAL LOW (ref >90)
Glucose, Bld: 313 mg/dL — ABNORMAL HIGH (ref 70–99)

## 2012-11-23 LAB — GLUCOSE, CAPILLARY: Glucose-Capillary: 353 mg/dL — ABNORMAL HIGH (ref 70–99)

## 2012-11-23 LAB — CBC
Hemoglobin: 11.2 g/dL — ABNORMAL LOW (ref 12.0–15.0)
MCH: 27.5 pg (ref 26.0–34.0)
MCV: 82.8 fL (ref 78.0–100.0)
RBC: 4.08 MIL/uL (ref 3.87–5.11)

## 2012-11-23 LAB — LEGIONELLA ANTIGEN, URINE

## 2012-11-23 LAB — STREP PNEUMONIAE URINARY ANTIGEN: Strep Pneumo Urinary Antigen: NEGATIVE

## 2012-11-23 MED ORDER — POTASSIUM CHLORIDE CRYS ER 20 MEQ PO TBCR
40.0000 meq | EXTENDED_RELEASE_TABLET | Freq: Once | ORAL | Status: AC
  Administered 2012-11-23: 40 meq via ORAL
  Filled 2012-11-23: qty 2

## 2012-11-23 MED ORDER — PREDNISONE 20 MG PO TABS
40.0000 mg | ORAL_TABLET | Freq: Every day | ORAL | Status: DC
  Administered 2012-11-24 – 2012-11-25 (×2): 40 mg via ORAL
  Filled 2012-11-23 (×3): qty 2

## 2012-11-23 MED ORDER — ALBUTEROL SULFATE (5 MG/ML) 0.5% IN NEBU
2.5000 mg | INHALATION_SOLUTION | Freq: Four times a day (QID) | RESPIRATORY_TRACT | Status: DC
  Administered 2012-11-24 – 2012-11-25 (×7): 2.5 mg via RESPIRATORY_TRACT
  Filled 2012-11-23 (×7): qty 0.5

## 2012-11-23 MED ORDER — IPRATROPIUM BROMIDE 0.02 % IN SOLN
0.5000 mg | Freq: Four times a day (QID) | RESPIRATORY_TRACT | Status: DC
  Administered 2012-11-24 – 2012-11-25 (×7): 0.5 mg via RESPIRATORY_TRACT
  Filled 2012-11-23 (×7): qty 2.5

## 2012-11-23 MED ORDER — WHITE PETROLATUM GEL
Status: AC
  Administered 2012-11-23: 0.2
  Filled 2012-11-23: qty 5

## 2012-11-23 NOTE — Progress Notes (Signed)
Inpatient Diabetes Program Recommendations  AACE/ADA: New Consensus Statement on Inpatient Glycemic Control (2013)  Target Ranges:  Prepandial:   less than 140 mg/dL      Peak postprandial:   less than 180 mg/dL (1-2 hours)      Critically ill patients:  140 - 180 mg/dL  Results for Sonya Garrett, Sonya Garrett (MRN [id number]) as of 11/23/2012 14:42  Ref. Range 11/22/2012 13:44 11/22/2012 17:17 11/22/2012 21:08 11/23/2012 07:43 11/23/2012 12:00  Glucose-Capillary Latest Range: 70-99 mg/dL 409 (H) 811 (H) 914 (H) 321 (H) 353 (H)    Inpatient Diabetes Program Recommendations Correction (SSI): increase to MODERATE correction  Thank you  Piedad Climes BSN, RN,CDE Inpatient Diabetes Coordinator [phone] (team pager)

## 2012-11-23 NOTE — Progress Notes (Signed)
[facility] PROGRESS NOTE  Sonya Garrett RUE:[id number] DOB: 06-Feb-1929 DOA: 11/18/2012 PCP: Kimber Relic, MD  Brief narrative Pt is 79 y.o. female resident of [facility] who came to ED with complaints of feeling sick, nauseous and weak over the past 4 days. She reports having a cough and chest congestion which has worsened over the past week, and she was diagnosed with a UTI and placed on outpatient therapy of Macrobid, and then started on Cipro 1 day prior to this admission. She does report having fevers and chills, but endorsed some dysuria and urinary urgency. Per family member she has some urinary incontinence . In the ED she was found to have a developing pneumonia on chest X-ray, and her urinalysis was found to be positive as well. She was placed on IV Levaquin and referred for admission.     Assessment/Plan: 1. Acute respiratory failure: Likely secondary to community-acquired pneumonia. Clinically improving. Titrate oxygen down and monitor. 2. Community acquired pneumonia: CT chest confirms bronchopneumonia. Clinically improved-minimal nonproductive cough, no dyspnea or chest pain. Influenza panel negative. Continue IV Rocephin and azithromycin but changed to oral Levaquin at discharge. Followup chest x-ray in a couple of weeks to ensure resolution. DC Tamiflu. 3. Presumed UTI: Currently asymptomatic. Initial urine culture showed Lactobacillus. Repeat urine cultures pending. Rocephin should cover. 4. Hypokalemia: Replete and follow BMP. 5. Type 2 diabetes mellitus: Good outpatient control. Inpatient control worsened secondary to steroids-taper steroids rapidly. Continue SSI. 6. Transaminitis: Unclear etiology. No GI symptoms. Improving. 7. Hypertension: Reasonable inpatient control. 8. Leukocytosis: Secondary to pneumonia. 9. Anemia: Stable 10. Obesity  Code Status: Full Family Communication: Discussed with patient's daughters Ms. Thom Chimes at bedside Disposition  Plan: Possible DC to SNF on 2/20   Consultants:  None  Procedures/Studies:  No results found.  Antibiotics:  Rocephin 2/15 -->  Zithromax 2/15 -->  Tamiflu 2/18 --> 2/19   HPI/Subjective: Minimal nonproductive cough, no chest pain or dyspnea. Chronic intermittent coughing on swallowing-unchanged. Had a BM yesterday. Overall feels much better.  Objective: Filed Vitals:   11/22/12 2050 11/23/12 0046 11/23/12 0412 11/23/12 0627  BP: 146/60   124/92  Pulse: 80   88  Temp: 98.3 F (36.8 C)   97.7 F (36.5 C)  TempSrc: Oral   Oral  Resp: 18   28  Height:      Weight:      SpO2: 97% 96% 96% 94%    Intake/Output Summary (Last 24 hours) at 11/23/12 1348 Last data filed at 11/23/12 1478  Gross per 24 hour  Intake 1374.91 ml  Output    200 ml  Net 1174.91 ml   Filed Weights   11/20/12 2020 11/21/12 0521  Weight: 81.9 kg (180 lb 8.9 oz) 81.6 kg (179 lb 14.3 oz)    Exam:   General exam: Comfortable. Obese  Respiratory system: Fair breath sounds bilaterally. Occasional left basal rhonchi and occasional basal crackles. No increased work of breathing.  Cardiovascular system: S1 & S2 heard, RRR. No JVD, murmurs, gallops, clicks or pedal edema.  Gastrointestinal system: Abdomen is nondistended, soft and nontender. Normal bowel sounds heard.  Central nervous system: Alert and oriented. No focal neurological deficits.  Extremities: Symmetric 5 x 5 power.   Data Reviewed: Basic Metabolic Panel:  Recent Labs Lab 11/18/12 2321 11/19/12 0630 11/21/12 0551 11/22/12 0600 11/23/12 0547  NA 135 136 137 140 138  K 3.8 3.5 3.8 3.8 3.0*  CL 98 100 101 101 100  CO2 25  26 27 28 26   GLUCOSE 181* 70 135* 149* 313*  BUN 20 16 9 9 14   CREATININE 0.86 0.83 0.72 0.72 0.63  CALCIUM 8.5 8.4 8.7 8.7 8.6   Liver Function Tests:  Recent Labs Lab 11/18/12 2321 11/23/12 0547  AST 171* 24  ALT 187* 93*  ALKPHOS 165* 166*  BILITOT 0.6 0.2*  PROT 6.7 7.1  ALBUMIN 2.8* 2.9*    No results found for this basename: LIPASE, AMYLASE,  in the last 168 hours No results found for this basename: AMMONIA,  in the last 168 hours CBC:  Recent Labs Lab 11/18/12 2321 11/19/12 0630 11/21/12 0551 11/22/12 0600 11/23/12 0547  WBC 12.1* 7.2 7.0 6.9 7.3  NEUTROABS 11.4*  --  5.4  --   --   HGB 11.0* 10.5* 10.8* 10.9* 11.2*  HCT 32.3* 31.4* 32.5* 32.8* 33.8*  MCV 83.5 83.1 84.0 84.1 82.8  PLT 194 169 182 184 206   Cardiac Enzymes: No results found for this basename: CKTOTAL, CKMB, CKMBINDEX, TROPONINI,  in the last 168 hours BNP (last 3 results) No results found for this basename: PROBNP,  in the last 8760 hours CBG:  Recent Labs Lab 11/22/12 1344 11/22/12 1717 11/22/12 2108 11/23/12 0743 11/23/12 1200  GLUCAP 236* 271* 299* 321* 353*    Recent Results (from the past 240 hour(s))  URINE CULTURE     Status: None   Collection Time    11/18/12 10:00 PM      Result Value Range Status   Specimen Description URINE, CATHETERIZED   Final   Special Requests NONE   Final   Culture  Setup Time 11/19/2012 05:37   Final   Colony Count >=100,000 COLONIES/ML   Final   Culture     Final   Value: LACTOBACILLUS SPECIES     Note: Standardized susceptibility testing for this organism is not available.   Report Status 11/21/2012 FINAL   Final  MRSA PCR SCREENING     Status: None   Collection Time    11/19/12  4:05 AM      Result Value Range Status   MRSA by PCR NEGATIVE  NEGATIVE Final   Comment:            The GeneXpert MRSA Assay (FDA     approved for NASAL specimens     only), is one component of a     comprehensive MRSA colonization     surveillance program. It is not     intended to diagnose MRSA     infection nor to guide or     monitor treatment for     MRSA infections.  CULTURE, BLOOD (ROUTINE X 2)     Status: None   Collection Time    11/19/12 10:25 AM      Result Value Range Status   Specimen Description BLOOD THUMB LEFT   Final   Special Requests  BOTTLES DRAWN AEROBIC ONLY 3.0CC    Final   Culture  Setup Time 11/19/2012 18:00   Final   Culture     Final   Value:        BLOOD CULTURE RECEIVED NO GROWTH TO DATE CULTURE WILL BE HELD FOR 5 DAYS BEFORE ISSUING A FINAL NEGATIVE REPORT   Report Status PENDING   Incomplete  CULTURE, BLOOD (ROUTINE X 2)     Status: None   Collection Time    11/19/12 10:34 AM      Result Value Range Status   Specimen  Description BLOOD ARM LEFT   Final   Special Requests BOTTLES DRAWN AEROBIC ONLY 8CC   Final   Culture  Setup Time 11/19/2012 18:00   Final   Culture     Final   Value:        BLOOD CULTURE RECEIVED NO GROWTH TO DATE CULTURE WILL BE HELD FOR 5 DAYS BEFORE ISSUING A FINAL NEGATIVE REPORT   Report Status PENDING   Incomplete     Studies: Ct Chest Wo Contrast  11/22/2012  *RADIOLOGY REPORT*  Clinical Data: Shortness of breath.  Wheezing.  Evaluate for pneumonia.  CT CHEST WITHOUT CONTRAST  Technique:  Multidetector CT imaging of the chest was performed following the standard protocol without IV contrast.  Comparison: No priors.  Findings:  Mediastinum: Heart size is borderline enlarged. There is no significant pericardial fluid, thickening or pericardial calcification. There is atherosclerosis of the thoracic aorta, the great vessels of the mediastinum and the coronary arteries, including calcified atherosclerotic plaque in the left anterior descending and left circumflex coronary arteries. No pathologically enlarged mediastinal or hilar lymph nodes. Please note that accurate exclusion of hilar adenopathy is limited on noncontrast CT scans.  Multiple borderline enlarged mediastinal lymph nodes are noted, largest of which measures 9 mm in short axis in the right paratracheal station.  The esophagus is unremarkable in appearance.  Lungs/Pleura: Throughout the posterior aspect of the left upper lobe and the entire left lower lobe there is peribronchovascular interstitial thickening, septal thickening,  peribronchovascular ground-glass attenuation micronodularity, volume loss and extensive architectural distortion.  Some mild cylindrical bronchiectasis is also noted throughout these regions, in addition to the inferior aspect of the right lower lobe.  Overall, the findings are favored to represent a resolving multilobar bronchopneumonia.  No significant pleural effusions.  Upper Abdomen: Unremarkable.  Musculoskeletal: There are no aggressive appearing lytic or blastic lesions noted in the visualized portions of the skeleton.  IMPRESSION: 1.  The findings in the left lung and to a lesser extent in the posterior aspect of the right lower lobe are favored to reflect a resolving multilobar bronchopneumonia. 2. Atherosclerosis, including two-vessel coronary artery disease. Assessment for potential risk factor modification, dietary therapy or pharmacologic therapy may be warranted, if clinically indicated.   Original Report Authenticated By: Trudie Reed, M.D.      Additional labs:   Scheduled Meds: . albuterol  2.5 mg Nebulization Q4H  . antiseptic oral rinse  15 mL Mouth Rinse BID  . azithromycin  500 mg Intravenous Q24H  . cefTRIAXone (ROCEPHIN)  IV  1 g Intravenous Q24H  . enoxaparin (LOVENOX) injection  40 mg Subcutaneous Q24H  . insulin aspart  0-5 Units Subcutaneous QHS  . insulin aspart  0-9 Units Subcutaneous TID WC  . ipratropium  0.5 mg Nebulization Q4H  . levothyroxine  50 mcg Oral QAC breakfast  . memantine  10 mg Oral Daily  . methylPREDNISolone (SOLU-MEDROL) injection  125 mg Intravenous Q6H  . oseltamivir  75 mg Oral BID  . pantoprazole  40 mg Oral Daily  . pregabalin  75 mg Oral TID  . senna-docusate  2 tablet Oral BID  . white petrolatum       Continuous Infusions: . sodium chloride 10 mL/hr at 11/22/12 1053    Principal Problem:   CAP (community acquired pneumonia) Active Problems:   UTI (lower urinary tract infection)   Diabetes mellitus   Hypertension    Leukocytosis   Obesity    Time spent: 45 minutes  [facility]  [facility] Pager [phone].   If 8PM-8AM, please contact night-coverage at [email or web address], password [facility] 11/23/2012, 1:48 PM  LOS: 5 days

## 2012-11-24 DIAGNOSIS — J9600 Acute respiratory failure, unspecified whether with hypoxia or hypercapnia: Secondary | ICD-10-CM

## 2012-11-24 DIAGNOSIS — E876 Hypokalemia: Secondary | ICD-10-CM

## 2012-11-24 LAB — GLUCOSE, CAPILLARY
Glucose-Capillary: 178 mg/dL — ABNORMAL HIGH (ref 70–99)
Glucose-Capillary: 255 mg/dL — ABNORMAL HIGH (ref 70–99)
Glucose-Capillary: 242 mg/dL — ABNORMAL HIGH (ref 70–99)

## 2012-11-24 LAB — COMPREHENSIVE METABOLIC PANEL
ALT: 66 U/L — ABNORMAL HIGH (ref 0–35)
AST: 16 U/L (ref 0–37)
Alkaline Phosphatase: 134 U/L — ABNORMAL HIGH (ref 39–117)
CO2: 30 mEq/L (ref 19–32)
Calcium: 9 mg/dL (ref 8.4–10.5)
Potassium: 3.5 mEq/L (ref 3.5–5.1)
Sodium: 138 mEq/L (ref 135–145)
Total Protein: 6.7 g/dL (ref 6.0–8.3)

## 2012-11-24 MED ORDER — AZITHROMYCIN 500 MG PO TABS
500.0000 mg | ORAL_TABLET | Freq: Every day | ORAL | Status: DC
  Administered 2012-11-24 – 2012-11-25 (×2): 500 mg via ORAL
  Filled 2012-11-24 (×2): qty 1

## 2012-11-24 NOTE — Progress Notes (Signed)
Physical Therapy Discharge Patient Details Name: Sonya Garrett MRN: [id number] DOB: 02-12-1929 Today's Date: 11/24/2012 Time: 4098-1191 PT Time Calculation (min): 24 min  Patient discharged from PT services secondary to pt uses lift at [facility] and nursing can continue OOB with maximove..  Please see latest therapy progress note for current level of functioning and progress toward goals.    Progress and discharge plan discussed with patient and/or caregiver: Patient/Caregiver agrees with plan  GP     [facility] 11/24/2012, 2:55 PM

## 2012-11-24 NOTE — Progress Notes (Signed)
[facility] PROGRESS NOTE  Sonya Garrett ZOX:[id number] DOB: [birth year] DOA: 11/18/2012 PCP: Kimber Relic, MD  Brief narrative Pt is 79 y.o. female resident of [facility] who came to ED with complaints of feeling sick, nauseous and weak over the past 4 days. She reports having a cough and chest congestion which has worsened over the past week, and she was diagnosed with a UTI and placed on outpatient therapy of Macrobid, and then started on Cipro 1 day prior to this admission. She does report having fevers and chills, but endorsed some dysuria and urinary urgency. Per family member she has some urinary incontinence . In the ED she was found to have a developing pneumonia on chest X-ray, and her urinalysis was found to be positive as well. She was placed on IV Levaquin and referred for admission.     Assessment/Plan: 1. Acute respiratory failure: Likely secondary to community-acquired pneumonia. Clinically improving. Titrate oxygen down and monitor. Patient does not use home oxygen. 2. Community acquired pneumonia: CT chest confirms bronchopneumonia. Influenza panel negative. Continue IV Rocephin and azithromycin but change to oral Levaquin at discharge. Followup chest x-ray in a couple of weeks to ensure resolution. DCed Tamiflu. Was doing well yesterday, but today sounds congested and wheezing. Family deny h/o COPD or Asthma. ? Pneumonia related reactive airway disease. Continue steroids and BD's. Monitor closely. Echo results not showing up- called echo labs.  3. Presumed UTI: Currently asymptomatic. Initial urine culture showed Lactobacillus. Repeat urine cultures pending. Rocephin should cover. 4. Hypokalemia: Repleted. 5. Type 2 diabetes mellitus: Good outpatient control. Inpatient control worsened secondary to steroids-taper steroids rapidly. Continue SSI. Improving control after tapering steroids.  6. Transaminitis: Unclear etiology. No GI symptoms. Improving. 7. Hypertension:  Reasonable inpatient control. 8. Leukocytosis: Secondary to pneumonia. 9. Anemia: Stable 10. Obesity  Code Status: Full Family Communication: Discussed with patient's daughters Ms. Robin, Ms.Becky and spouse at bedside Disposition Plan: Possible DC to SNF on 2/21   Consultants:  None  Procedures/Studies:  No results found.  Antibiotics:  Rocephin 2/15 -->  Zithromax 2/15 -->  Tamiflu 2/18 --> 2/19   HPI/Subjective: Feels congested and wheezing this morning.  Objective: Filed Vitals:   11/24/12 0419 11/24/12 0548 11/24/12 1424 11/24/12 1500  BP: 169/81 155/78  147/78  Pulse: 78 81  81  Temp: 98.3 F (36.8 C)   97.7 F (36.5 C)  TempSrc: Oral     Resp: 16   16  Height:      Weight:      SpO2: 97%  93%     Intake/Output Summary (Last 24 hours) at 11/24/12 1555 Last data filed at 11/24/12 1023  Gross per 24 hour  Intake    240 ml  Output      0 ml  Net    240 ml   Filed Weights   11/20/12 2020 11/21/12 0521  Weight: 81.9 kg (180 lb 8.9 oz) 81.6 kg (179 lb 14.3 oz)    Exam:   General exam: Comfortable. Obese  Respiratory system: reduced breath sounds bilaterally with bilateral few expiratory rhonchi. No increased work of breathing.  Cardiovascular system: S1 & S2 heard, RRR. No JVD, murmurs, gallops, clicks or pedal edema.  Gastrointestinal system: Abdomen is nondistended, soft and nontender. Normal bowel sounds heard.  Central nervous system: Alert and oriented. No focal neurological deficits.  Extremities: Symmetric 5 x 5 power.   Data Reviewed: Basic Metabolic Panel:  Recent Labs Lab 11/19/12 0630 11/21/12 0551 11/22/12 0600  11/23/12 0547 11/24/12 0528  NA 136 137 140 138 138  K 3.5 3.8 3.8 3.0* 3.5  CL 100 101 101 100 101  CO2 26 27 28 26 30   GLUCOSE 70 135* 149* 313* 190*  BUN 16 9 9 14 18   CREATININE 0.83 0.72 0.72 0.63 0.77  CALCIUM 8.4 8.7 8.7 8.6 9.0   Liver Function Tests:  Recent Labs Lab 11/18/12 2321 11/23/12 0547  11/24/12 0528  AST 171* 24 16  ALT 187* 93* 66*  ALKPHOS 165* 166* 134*  BILITOT 0.6 0.2* 0.2*  PROT 6.7 7.1 6.7  ALBUMIN 2.8* 2.9* 2.8*   No results found for this basename: LIPASE, AMYLASE,  in the last 168 hours No results found for this basename: AMMONIA,  in the last 168 hours CBC:  Recent Labs Lab 11/18/12 2321 11/19/12 0630 11/21/12 0551 11/22/12 0600 11/23/12 0547  WBC 12.1* 7.2 7.0 6.9 7.3  NEUTROABS 11.4*  --  5.4  --   --   HGB 11.0* 10.5* 10.8* 10.9* 11.2*  HCT 32.3* 31.4* 32.5* 32.8* 33.8*  MCV 83.5 83.1 84.0 84.1 82.8  PLT 194 169 182 184 206   Cardiac Enzymes: No results found for this basename: CKTOTAL, CKMB, CKMBINDEX, TROPONINI,  in the last 168 hours BNP (last 3 results) No results found for this basename: PROBNP,  in the last 8760 hours CBG:  Recent Labs Lab 11/23/12 1200 11/23/12 1725 11/23/12 2112 11/24/12 0822 11/24/12 1148  GLUCAP 353* 329* 279* 148* 178*    Recent Results (from the past 240 hour(s))  URINE CULTURE     Status: None   Collection Time    11/18/12 10:00 PM      Result Value Range Status   Specimen Description URINE, CATHETERIZED   Final   Special Requests NONE   Final   Culture  Setup Time 11/19/2012 05:37   Final   Colony Count >=100,000 COLONIES/ML   Final   Culture     Final   Value: LACTOBACILLUS SPECIES     Note: Standardized susceptibility testing for this organism is not available.   Report Status 11/21/2012 FINAL   Final  MRSA PCR SCREENING     Status: None   Collection Time    11/19/12  4:05 AM      Result Value Range Status   MRSA by PCR NEGATIVE  NEGATIVE Final   Comment:            The GeneXpert MRSA Assay (FDA     approved for NASAL specimens     only), is one component of a     comprehensive MRSA colonization     surveillance program. It is not     intended to diagnose MRSA     infection nor to guide or     monitor treatment for     MRSA infections.  CULTURE, BLOOD (ROUTINE X 2)     Status: None    Collection Time    11/19/12 10:25 AM      Result Value Range Status   Specimen Description BLOOD THUMB LEFT   Final   Special Requests BOTTLES DRAWN AEROBIC ONLY 3.0CC    Final   Culture  Setup Time 11/19/2012 18:00   Final   Culture     Final   Value:        BLOOD CULTURE RECEIVED NO GROWTH TO DATE CULTURE WILL BE HELD FOR 5 DAYS BEFORE ISSUING A FINAL NEGATIVE REPORT   Report Status PENDING  Incomplete  CULTURE, BLOOD (ROUTINE X 2)     Status: None   Collection Time    11/19/12 10:34 AM      Result Value Range Status   Specimen Description BLOOD ARM LEFT   Final   Special Requests BOTTLES DRAWN AEROBIC ONLY 8CC   Final   Culture  Setup Time 11/19/2012 18:00   Final   Culture     Final   Value:        BLOOD CULTURE RECEIVED NO GROWTH TO DATE CULTURE WILL BE HELD FOR 5 DAYS BEFORE ISSUING A FINAL NEGATIVE REPORT   Report Status PENDING   Incomplete  URINE CULTURE     Status: None   Collection Time    11/23/12  6:16 AM      Result Value Range Status   Specimen Description URINE, RANDOM   Final   Special Requests NONE   Final   Culture  Setup Time 11/23/2012 06:28   Final   Colony Count PENDING   Incomplete   Culture Culture reincubated for better growth   Final   Report Status PENDING   Incomplete     Studies: No results found.   Additional labs:   Scheduled Meds: . albuterol  2.5 mg Nebulization Q6H  . antiseptic oral rinse  15 mL Mouth Rinse BID  . azithromycin  500 mg Oral Daily  . cefTRIAXone (ROCEPHIN)  IV  1 g Intravenous Q24H  . enoxaparin (LOVENOX) injection  40 mg Subcutaneous Q24H  . insulin aspart  0-5 Units Subcutaneous QHS  . insulin aspart  0-9 Units Subcutaneous TID WC  . ipratropium  0.5 mg Nebulization Q6H  . levothyroxine  50 mcg Oral QAC breakfast  . memantine  10 mg Oral Daily  . pantoprazole  40 mg Oral Daily  . predniSONE  40 mg Oral Q breakfast  . pregabalin  75 mg Oral TID  . senna-docusate  2 tablet Oral BID   Continuous Infusions:     Principal Problem:   CAP (community acquired pneumonia) Active Problems:   UTI (lower urinary tract infection)   Diabetes mellitus   Hypertension   Leukocytosis   Obesity   Acute respiratory failure   Hypokalemia    Time spent: 35 minutes    [facility]  [facility] Pager [phone].   If 8PM-8AM, please contact night-coverage at [email or web address], password [facility] 11/24/2012, 3:55 PM  LOS: 6 days

## 2012-11-24 NOTE — Progress Notes (Signed)
Will change azithromycin to tablet due to national shortage of IV product and pt's ability to take PO medications.  Per IV to PO protocol.  Vernard Gambles, PharmD, BCPS 11/24/2012 1:00 AM

## 2012-11-24 NOTE — Progress Notes (Signed)
Physical Therapy Treatment Patient Details Name: Sonya Garrett MRN: [id number] DOB: Jan 29, 1929 Today's Date: 11/24/2012 Time: 5621-3086 PT Time Calculation (min): 24 min  PT Assessment / Plan / Recommendation Comments on Treatment Session  Pt adm with PNA.  Attempted to use [employer] Plus for transfer since pt uses a similar lift at [facility]. Pt unable to achieve enough of a stand with [employer] to transfer.  Recommend that pt continue  OOB with maximove by nursing while here.  PT will sign off.    Follow Up Recommendations  No PT follow up (return to NH)     Does the patient have the potential to tolerate intense rehabilitation     Barriers to Discharge        Equipment Recommendations  None recommended by PT    Recommendations for Other Services    Frequency     Plan Other (comment) (PT dc'd since nursing can continue OOB with lift.)    Precautions / Restrictions Precautions Precautions: Fall   Pertinent Vitals/Pain No c/o's of pain    Mobility  Bed Mobility Bed Mobility: Supine to Sit;Sitting - Scoot to [place] of Bed;Sit to Supine Rolling Right: 3: Mod assist;With rail Rolling Left: 2: Max assist;With rail Supine to Sit: 3: Mod assist Sitting - Scoot to Edge of Bed: 2: Max assist Sit to Supine: 1: +2 Total assist Sit to Supine: Patient Percentage: 20% Details for Bed Mobility Assistance: Assist to bring trunk up. Transfers Transfer via Lift Equipment: Maximove Details for Transfer Assistance: Attempted x 2 to use Sara Plus to get pt into standing but unable to achieve standing.    Exercises     PT Diagnosis:    PT Problem List:   PT Treatment Interventions:     PT Goals Acute Rehab PT Goals PT Goal: Rolling Supine to Right Side - Progress: Partly met PT Goal: Rolling Supine to Left Side - Progress: Not met PT Goal: Supine/Side to Sit - Progress: Not met PT Goal: Sit at Edge Of Bed - Progress: Met PT Goal: Sit to Supine/Side - Progress: Not met PT Transfer  Goal: Bed to Chair/Chair to Bed - Progress: Not met  Visit Information  Last PT Received On: 11/24/12 Assistance Needed: +2    Subjective Data  Subjective: Pt states the [employer] doesn't look like the lift she uses at [facility]  Cognition Overall Cognitive Status: Appears within functional limits for tasks assessed/performed Arousal/Alertness: Awake/alert Orientation Level: Appears intact for tasks assessed Behavior During Session: [facility] for tasks performed    Balance  Balance Balance Assessed: Yes Static Sitting Balance Static Sitting - Balance Support: Bilateral upper extremity supported Static Sitting - Level of Assistance: 5: Stand by assistance Static Sitting - Comment/# of Minutes: Sat x 5 minutes in preparation to try [employer] of Session PT - End of Session Equipment Utilized During Treatment: Other (comment);Oxygen (maximove, sara plus) Activity Tolerance: Patient tolerated treatment well Patient left: in chair;with call bell/phone within reach;with family/visitor present Nurse Communication: Mobility status;Need for lift equipment   GP     Tomma Ehinger 11/24/2012, 2:55 PM  [facility] PT [phone]

## 2012-11-24 NOTE — Care Management Note (Signed)
    Page 1 of 1   11/25/2012     3:25:30 PM   CARE MANAGEMENT NOTE 11/25/2012  Patient:  JAEDAN, SCHUMAN   Account Number:  [id number]  Date Initiated:  11/24/2012  Documentation initiated by:  Letha Cape  Subjective/Objective Assessment:   dx pna  admit- from [facility]     Action/Plan:   Anticipated DC Date:  11/25/2012   Anticipated DC Plan:  SKILLED NURSING FACILITY  In-house referral  Clinical Social Worker      DC Planning Services  CM consult      Choice offered to / List presented to:             Status of service:  Completed, signed off Medicare Important Message given?   (If response is "NO", the following Medicare IM given date fields will be blank) Date Medicare IM given:   Date Additional Medicare IM given:    Discharge Disposition:  SKILLED NURSING FACILITY  Per UR Regulation:  Reviewed for med. necessity/level of care/duration of stay  If discussed at Long Length of Stay Meetings, dates discussed:   11/24/2012    Comments:  11/25/12 15:24 Letha Cape RN, BSN [phone] patient dc to [facility], CSW following.  11/24/12 16:17 Letha Cape RN, BSN [phone] patient is from [facility], CSW following, patient is for dc tomorrow to SNF, patient had some wheezing today, resp txt's were initiated.

## 2012-11-24 NOTE — Progress Notes (Signed)
Reduced pt O2 to 1L N/C. Gave pt IS and educated on use . Pt achieved 750 using IS 4 times.   Peter Congo RN

## 2012-11-25 LAB — CULTURE, BLOOD (ROUTINE X 2): Culture: NO GROWTH

## 2012-11-25 LAB — URINE CULTURE: Colony Count: 25000

## 2012-11-25 LAB — HEPATIC FUNCTION PANEL
Bilirubin, Direct: <0.1 mg/dL (ref 0.0–0.3)
Total Protein: 6.9 g/dL (ref 6.0–8.3)

## 2012-11-25 MED ORDER — LEVOFLOXACIN 500 MG PO TABS: 500.0000 mg | ORAL_TABLET | Freq: Every day | ORAL | Status: AC

## 2012-11-25 MED ORDER — IPRATROPIUM BROMIDE 0.02 % IN SOLN: 0.5000 mg | Freq: Four times a day (QID) | RESPIRATORY_TRACT | Status: AC

## 2012-11-25 MED ORDER — PREDNISONE 10 MG PO TABS: ORAL_TABLET | ORAL | Status: AC

## 2012-11-25 MED ORDER — GUAIFENESIN-DM 100-10 MG/5ML PO SYRP: 5.0000 mL | ORAL_SOLUTION | ORAL | Status: DC | PRN

## 2012-11-25 MED ORDER — ALBUTEROL SULFATE (5 MG/ML) 0.5% IN NEBU: 2.5000 mg | INHALATION_SOLUTION | Freq: Four times a day (QID) | RESPIRATORY_TRACT | Status: AC

## 2012-11-25 MED ORDER — ALBUTEROL SULFATE (5 MG/ML) 0.5% IN NEBU: 2.5000 mg | INHALATION_SOLUTION | RESPIRATORY_TRACT | Status: AC | PRN

## 2012-11-25 NOTE — Discharge Summary (Addendum)
Physician Discharge Summary  Sonya Garrett ZOX:[id number] DOB: 16-Feb-1929 DOA: 11/18/2012  PCP: Kimber Relic, MD  Admit date: 11/18/2012 Discharge date: 11/25/2012  Time spent: Greater than 30 minutes  Recommendations for Outpatient Follow-up:  1. With Dr. Murray Hodgkins, PCP in one week. 2. Followup CT chest/chest x-ray in a couple of weeks to ensure resolution of pneumonia process. If patient has recurring symptoms or persistent findings on imaging, may consider outpatient pulmonary consultation. 3. Oxygen via nasal cannula at 1 L per minute continuously. Titrate to maintain sats greater than 92%. If patient is consistently maintaining saturations greater than 92% on room air-discontinue oxygen. 4. Incentive spirometry every 4 hours while awake. 5. Followup LFTs as outpatient as deemed necessary.  Discharge Diagnoses:  Principal Problem:   CAP (community acquired pneumonia) Active Problems:   UTI (lower urinary tract infection)   Diabetes mellitus   Hypertension   Leukocytosis   Obesity   Acute respiratory failure   Hypokalemia   Discharge Condition: Improved & Stable  Diet recommendation: Heart healthy and diabetic  Filed Weights   11/20/12 2020 11/21/12 0521  Weight: 81.9 kg (180 lb 8.9 oz) 81.6 kg (179 lb 14.3 oz)    History of present illness:  Pt is 79 y.o. female resident of [facility] who came to ED with complaints of feeling sick, nauseous and weak over the past 4 days. She reports having a cough and chest congestion which has worsened over the past week, and she was diagnosed with a UTI and placed on outpatient therapy of Macrobid, and then started on Cipro 1 day prior to this admission. She does report having fevers and chills, but endorsed some dysuria and urinary urgency. Per family member she has some urinary incontinence . In the ED she was found to have a developing pneumonia on chest X-ray, and her urinalysis was found to be positive as well. She was placed  on IV Levaquin and referred for admission. Patient gives no history of asthma or COPD. Never smoked. Not on home oxygen.   Hospital Course:  1. Acute respiratory failure: Likely secondary to community-acquired pneumonia and associated reactive airway disease. Treated underlying cause. Clinically improving-currently saturating at mid 90s on 1 L of oxygen. Patient did not use home oxygen prior to admission. Titrate down oxygen as tolerated. Aggressive mobilization and incentive spirometer use. 2. Multilobar bronchopneumonia/Community acquired pneumonia: CT chest confirms bronchopneumonia-LUL, LLL and RLL. Influenza panel negative. Blood cultures x2 negative. Patient was placed empirically on IV Rocephin and azithromycin of which she has completed a 7 day course today. Given extent of disease on imaging, we'll treat with additional 3 days of Levaquin to complete a total 10 days of antibiotics. Followup CT chest or chest x-ray in a couple of weeks to ensure resolution. If patient has recurring symptoms or incomplete resolution on imaging, may consider outpatient pulmonology consultation. Patient had some wheezing but no history of COPD or asthma. No features suggestive of congestive heart failure. Possibly pneumonia related reactive airway disease. Patient was briefly placed on IV steroids which were quickly transitioned to oral steroids and will be tapered gradually. With these measures her bronchospasm has significantly improved. Patient and family denied any symptoms suggestive of aspiration 3. Presumed UTI: Currently asymptomatic. Initial urine culture showed Lactobacillus and repeat urine culture showed yeast. Not sure if she truly had UTI. 4. Hypokalemia: Repleted. 5. Type 2 diabetes mellitus: Good outpatient control. Inpatient control worsened secondary to steroids-taper steroids rapidly. Continue home oral hypoglycemics. 6. Transaminitis:  Unclear etiology. No GI symptoms. Significantly improved. May  followup as outpatient as the necessary. 7. Hypertension: Reasonable inpatient control. 8. Leukocytosis: Secondary to pneumonia. Resolved. 9. Anemia: Stable 10. Obesity 11. Dementia: Per the daughter's, mental status at baseline.   Procedures:  None   Consultations:  None  Discharge Exam:  Complaints: Patient denies complaints. Denies dyspnea, wheezing or cough. Per family, good by mouth intake and mental status back to baseline.  Filed Vitals:   11/25/12 0507 11/25/12 0820 11/25/12 1315 11/25/12 1344  BP: 159/63   130/60  Pulse: 72   72  Temp: 98.7 F (37.1 C)   97.4 F (36.3 C)  TempSrc: Oral   Oral  Resp:    20  Height:      Weight:      SpO2: 93% 98% 95% 94%    General exam: Comfortable. Obese  Respiratory system: Fair breath sounds bilaterally with occasional bilateral rhonchi. No increased work of breathing.  Cardiovascular system: S1 & S2 heard, RRR. No JVD, murmurs, gallops, clicks or pedal edema.  Gastrointestinal system: Abdomen is nondistended, soft and nontender. Normal bowel sounds heard.  Central nervous system: Alert and oriented. No focal neurological deficits.    Discharge Instructions      Discharge Orders   Future Orders Complete By Expires     Call MD for:  difficulty breathing, headache or visual disturbances  As directed     Call MD for:  temperature >100.4  As directed     Diet - low sodium heart healthy  As directed     Diet Carb Modified  As directed     Discharge instructions  As directed     Comments:      Oxygen via nasal cannula at 1 L per minute continuously. Titrate to maintain saturations greater than 92%. If consistently greater than 92% on room air, may DC O2.    Increase activity slowly  As directed         Medication List    STOP taking these medications       ciprofloxacin 500 MG tablet  Commonly known as:  CIPRO      TAKE these medications       acetaminophen 325 MG tablet  Commonly known as:  TYLENOL  Take  650 mg by mouth every 6 (six) hours as needed for pain.     albuterol (5 MG/ML) 0.5% nebulizer solution  Commonly known as:  PROVENTIL  Take 0.5 mLs (2.5 mg total) by nebulization every 2 (two) hours as needed for wheezing or shortness of breath.     albuterol (5 MG/ML) 0.5% nebulizer solution  Commonly known as:  PROVENTIL  Take 0.5 mLs (2.5 mg total) by nebulization every 6 (six) hours.     calcium carbonate 500 MG chewable tablet  Commonly known as:  TUMS - dosed in mg elemental calcium  Chew 1 tablet by mouth 2 (two) times daily as needed for heartburn.     FLUoxetine 20 MG/5ML solution  Commonly known as:  PROZAC  Take 20 mg by mouth every evening.     glipiZIDE 5 MG tablet  Commonly known as:  GLUCOTROL  Take 5 mg by mouth 2 (two) times daily before a meal.     guaiFENesin-dextromethorphan 100-10 MG/5ML syrup  Commonly known as:  ROBITUSSIN DM  Take 5 mLs by mouth every 4 (four) hours as needed for cough.     hydroxypropyl methylcellulose 2.5 % ophthalmic solution  Commonly known as:  ISOPTO TEARS  Place 1 drop into both eyes 2 (two) times daily.     ipratropium 0.02 % nebulizer solution  Commonly known as:  ATROVENT  Take 2.5 mLs (0.5 mg total) by nebulization every 6 (six) hours.     levofloxacin 500 MG tablet  Commonly known as:  LEVAQUIN  Take 1 tablet (500 mg total) by mouth daily.     levothyroxine 50 MCG tablet  Commonly known as:  SYNTHROID, LEVOTHROID  Take 50 mcg by mouth daily.     memantine 10 MG tablet  Commonly known as:  NAMENDA  Take 10 mg by mouth 2 (two) times daily.     metFORMIN 500 MG tablet  Commonly known as:  GLUCOPHAGE  Take 500 mg by mouth 2 (two) times daily with a meal.     nystatin 100000 UNIT/GM Powd  Apply 1 g topically 2 (two) times daily as needed (applied to skin folds twice daily as needed for rash).     nystatin-triamcinolone cream  Commonly known as:  MYCOLOG II  Apply 1 application topically at bedtime. To perineal  area     omeprazole 20 MG capsule  Commonly known as:  PRILOSEC  Take 20 mg by mouth daily.     polyethylene glycol packet  Commonly known as:  MIRALAX / GLYCOLAX  Take 17 g by mouth daily.     predniSONE 10 MG tablet  Commonly known as:  DELTASONE  Take 3 tabs daily for 3 days, then 2 tabs daily for 3 days, then 1 tab daily for 3 days, then stop.     pregabalin 75 MG capsule  Commonly known as:  LYRICA  Take 75 mg by mouth at bedtime.     promethazine 25 MG tablet  Commonly known as:  PHENERGAN  Take 25 mg by mouth every 4 (four) hours as needed for nausea.     saccharomyces boulardii 250 MG capsule  Commonly known as:  FLORASTOR  Take 250 mg by mouth 2 (two) times daily.     senna-docusate 8.6-50 MG per tablet  Commonly known as:  Senokot-S  Take 2 tablets by mouth every other day.     vitamin B-12 1000 MCG tablet  Commonly known as:  CYANOCOBALAMIN  Take 1,000 mcg by mouth every evening.     Vitamin D-3 1000 UNITS Caps  Take 1,000 Units by mouth daily.       Follow-up Information   Follow up with GREEN, Lenon Curt, MD. Schedule an appointment as soon as possible for a visit in 1 week.   Contact information:   [address] Jeanella [place] [address] [phone]        The results of significant diagnostics from this hospitalization (including imaging, microbiology, ancillary and laboratory) are listed below for reference.    Significant Diagnostic Studies: Dg Chest 2 View  11/19/2012  *RADIOLOGY REPORT*  Clinical Data: Fever, nausea and headache; urinary tract infection. History of diabetes.  CHEST - 2 VIEW  Comparison: None.  Findings: The lungs are well expanded.  Left basilar airspace opacification raises concern for pneumonia.  Mild underlying vascular congestion is seen.  A small left pleural effusion is suspected.  No pneumothorax is seen.  The heart is borderline normal in size; calcification is noted within the aortic arch.  No acute osseous abnormalities  are seen.  IMPRESSION: Left basilar airspace opacification raises concern for pneumonia; suspect small left pleural effusion.  Mild underlying vascular congestion seen.   Original Report  Authenticated By: Tonia Ghent, M.D.    Ct Chest Wo Contrast  11/22/2012  *RADIOLOGY REPORT*  Clinical Data: Shortness of breath.  Wheezing.  Evaluate for pneumonia.  CT CHEST WITHOUT CONTRAST  Technique:  Multidetector CT imaging of the chest was performed following the standard protocol without IV contrast.  Comparison: No priors.  Findings:  Mediastinum: Heart size is borderline enlarged. There is no significant pericardial fluid, thickening or pericardial calcification. There is atherosclerosis of the thoracic aorta, the great vessels of the mediastinum and the coronary arteries, including calcified atherosclerotic plaque in the left anterior descending and left circumflex coronary arteries. No pathologically enlarged mediastinal or hilar lymph nodes. Please note that accurate exclusion of hilar adenopathy is limited on noncontrast CT scans.  Multiple borderline enlarged mediastinal lymph nodes are noted, largest of which measures 9 mm in short axis in the right paratracheal station.  The esophagus is unremarkable in appearance.  Lungs/Pleura: Throughout the posterior aspect of the left upper lobe and the entire left lower lobe there is peribronchovascular interstitial thickening, septal thickening, peribronchovascular ground-glass attenuation micronodularity, volume loss and extensive architectural distortion.  Some mild cylindrical bronchiectasis is also noted throughout these regions, in addition to the inferior aspect of the right lower lobe.  Overall, the findings are favored to represent a resolving multilobar bronchopneumonia.  No significant pleural effusions.  Upper Abdomen: Unremarkable.  Musculoskeletal: There are no aggressive appearing lytic or blastic lesions noted in the visualized portions of the skeleton.   IMPRESSION: 1.  The findings in the left lung and to a lesser extent in the posterior aspect of the right lower lobe are favored to reflect a resolving multilobar bronchopneumonia. 2. Atherosclerosis, including two-vessel coronary artery disease. Assessment for potential risk factor modification, dietary therapy or pharmacologic therapy may be warranted, if clinically indicated.   Original Report Authenticated By: Trudie Reed, M.D.     Microbiology: Recent Results (from the past 240 hour(s))  URINE CULTURE     Status: None   Collection Time    11/18/12 10:00 PM      Result Value Range Status   Specimen Description URINE, CATHETERIZED   Final   Special Requests NONE   Final   Culture  Setup Time 11/19/2012 05:37   Final   Colony Count >=100,000 COLONIES/ML   Final   Culture     Final   Value: LACTOBACILLUS SPECIES     Note: Standardized susceptibility testing for this organism is not available.   Report Status 11/21/2012 FINAL   Final  MRSA PCR SCREENING     Status: None   Collection Time    11/19/12  4:05 AM      Result Value Range Status   MRSA by PCR NEGATIVE  NEGATIVE Final   Comment:            The GeneXpert MRSA Assay (FDA     approved for NASAL specimens     only), is one component of a     comprehensive MRSA colonization     surveillance program. It is not     intended to diagnose MRSA     infection nor to guide or     monitor treatment for     MRSA infections.  CULTURE, BLOOD (ROUTINE X 2)     Status: None   Collection Time    11/19/12 10:25 AM      Result Value Range Status   Specimen Description BLOOD THUMB LEFT   Final   Special Requests  BOTTLES DRAWN AEROBIC ONLY 3.0CC    Final   Culture  Setup Time 11/19/2012 18:00   Final   Culture NO GROWTH 5 DAYS   Final   Report Status 11/25/2012 FINAL   Final  CULTURE, BLOOD (ROUTINE X 2)     Status: None   Collection Time    11/19/12 10:34 AM      Result Value Range Status   Specimen Description BLOOD ARM LEFT    Final   Special Requests BOTTLES DRAWN AEROBIC ONLY 8CC   Final   Culture  Setup Time 11/19/2012 18:00   Final   Culture NO GROWTH 5 DAYS   Final   Report Status 11/25/2012 FINAL   Final  URINE CULTURE     Status: None   Collection Time    11/23/12  6:16 AM      Result Value Range Status   Specimen Description URINE, RANDOM   Final   Special Requests NONE   Final   Culture  Setup Time 11/23/2012 06:28   Final   Colony Count 25,000 COLONIES/ML   Final   Culture YEAST   Final   Report Status 11/25/2012 FINAL   Final     Labs: Basic Metabolic Panel:  Recent Labs Lab 11/19/12 0630 11/21/12 0551 11/22/12 0600 11/23/12 0547 11/24/12 0528  NA 136 137 140 138 138  K 3.5 3.8 3.8 3.0* 3.5  CL 100 101 101 100 101  CO2 26 27 28 26 30   GLUCOSE 70 135* 149* 313* 190*  BUN 16 9 9 14 18   CREATININE 0.83 0.72 0.72 0.63 0.77  CALCIUM 8.4 8.7 8.7 8.6 9.0   Liver Function Tests:  Recent Labs Lab 11/18/12 2321 11/23/12 0547 11/24/12 0528 11/25/12 0525  AST 171* 24 16 14   ALT 187* 93* 66* 52*  ALKPHOS 165* 166* 134* 135*  BILITOT 0.6 0.2* 0.2* 0.2*  PROT 6.7 7.1 6.7 6.9  ALBUMIN 2.8* 2.9* 2.8* 3.0*   No results found for this basename: LIPASE, AMYLASE,  in the last 168 hours No results found for this basename: AMMONIA,  in the last 168 hours CBC:  Recent Labs Lab 11/18/12 2321 11/19/12 0630 11/21/12 0551 11/22/12 0600 11/23/12 0547  WBC 12.1* 7.2 7.0 6.9 7.3  NEUTROABS 11.4*  --  5.4  --   --   HGB 11.0* 10.5* 10.8* 10.9* 11.2*  HCT 32.3* 31.4* 32.5* 32.8* 33.8*  MCV 83.5 83.1 84.0 84.1 82.8  PLT 194 169 182 184 206   Cardiac Enzymes: No results found for this basename: CKTOTAL, CKMB, CKMBINDEX, TROPONINI,  in the last 168 hours BNP: BNP (last 3 results) No results found for this basename: PROBNP,  in the last 8760 hours CBG:  Recent Labs Lab 11/24/12 1148 11/24/12 1740 11/24/12 2137 11/25/12 0800 11/25/12 1219  GLUCAP 178* 255* 242* 205* 186*     Additional labs: 1. ABG: PH 7.397, PCO2 46, PO2 65, bicarbonate 27 and oxygen saturation 94% on 2 L per minute oxygen. 2. Hemoglobin A1c: 6.6 3. Influenza panel: Negative 4. Urine Legionella and streptococcal pneumonia antigen negative 5. 2-D echocardiogram: Unremarkable   Signed:  Luria Rosario  [facility] 11/25/2012, 1:49 PM

## 2012-11-25 NOTE — Progress Notes (Signed)
Inpatient Diabetes Program Recommendations  AACE/ADA: New Consensus Statement on Inpatient Glycemic Control (2013)  Target Ranges:  Prepandial:   less than 140 mg/dL      Peak postprandial:   less than 180 mg/dL (1-2 hours)      Critically ill patients:  140 - 180 mg/dL   Reason for Visit: CBGs 2/20  148-178-244-242 mg/dl       7/82  956-213 mg/dl  Inpatient Diabetes Program Recommendations Correction (SSI): Increase Novolog correction scale to MODERATE AC & HS if CBGs continue greater than 180 mg/dl.  Note:

## 2012-11-25 NOTE — Clinical Social Work Note (Signed)
CSW was consulted to complete discharge of patient. Pt to transfer to [facility]: [place]  today via facility [place]. Family and facility are aware of d/c. D/C packet complete with chart copy, signed FL2, and signed hard Rx.  CSW signing off as no other CSW needs identified at this time.  [name], LCSWA [facility] Clinical Social Worker Contact #: [phone]

## 2012-11-25 NOTE — Progress Notes (Signed)
NURSING PROGRESS NOTE  Sonya Garrett [id number] Discharge Data: 11/25/2012 3:02 PM Attending Provider: No att. providers found [name], Lenon Curt, MD     [name] to be D/C'd Skilled nursing facility per MD order.  AVS sent with patient to facility.  All IV's discontinued with no bleeding noted. All belongings returned to patient for patient to take home.   Last Vital Signs:  Blood pressure 130/60, pulse 72, temperature 97.4 F (36.3 C), temperature source Oral, resp. rate 20, height 5' 3.6" (1.615 m), weight 81.6 kg (179 lb 14.3 oz), SpO2 94.00%.  Discharge Medication List   Medication List    STOP taking these medications       ciprofloxacin 500 MG tablet  Commonly known as:  CIPRO      TAKE these medications       acetaminophen 325 MG tablet  Commonly known as:  TYLENOL  Take 650 mg by mouth every 6 (six) hours as needed for pain.     albuterol (5 MG/ML) 0.5% nebulizer solution  Commonly known as:  PROVENTIL  Take 0.5 mLs (2.5 mg total) by nebulization every 2 (two) hours as needed for wheezing or shortness of breath.     albuterol (5 MG/ML) 0.5% nebulizer solution  Commonly known as:  PROVENTIL  Take 0.5 mLs (2.5 mg total) by nebulization every 6 (six) hours.     calcium carbonate 500 MG chewable tablet  Commonly known as:  TUMS - dosed in mg elemental calcium  Chew 1 tablet by mouth 2 (two) times daily as needed for heartburn.     FLUoxetine 20 MG/5ML solution  Commonly known as:  PROZAC  Take 20 mg by mouth every evening.     glipiZIDE 5 MG tablet  Commonly known as:  GLUCOTROL  Take 5 mg by mouth 2 (two) times daily before a meal.     guaiFENesin-dextromethorphan 100-10 MG/5ML syrup  Commonly known as:  ROBITUSSIN DM  Take 5 mLs by mouth every 4 (four) hours as needed for cough.     hydroxypropyl methylcellulose 2.5 % ophthalmic solution  Commonly known as:  ISOPTO TEARS  Place 1 drop into both eyes 2 (two) times daily.     ipratropium 0.02  % nebulizer solution  Commonly known as:  ATROVENT  Take 2.5 mLs (0.5 mg total) by nebulization every 6 (six) hours.     levofloxacin 500 MG tablet  Commonly known as:  LEVAQUIN  Take 1 tablet (500 mg total) by mouth daily.     levothyroxine 50 MCG tablet  Commonly known as:  SYNTHROID, LEVOTHROID  Take 50 mcg by mouth daily.     memantine 10 MG tablet  Commonly known as:  NAMENDA  Take 10 mg by mouth 2 (two) times daily.     metFORMIN 500 MG tablet  Commonly known as:  GLUCOPHAGE  Take 500 mg by mouth 2 (two) times daily with a meal.     nystatin 100000 UNIT/GM Powd  Apply 1 g topically 2 (two) times daily as needed (applied to skin folds twice daily as needed for rash).     nystatin-triamcinolone cream  Commonly known as:  MYCOLOG II  Apply 1 application topically at bedtime. To perineal area     omeprazole 20 MG capsule  Commonly known as:  PRILOSEC  Take 20 mg by mouth daily.     polyethylene glycol packet  Commonly known as:  MIRALAX / GLYCOLAX  Take 17 g by mouth daily.  predniSONE 10 MG tablet  Commonly known as:  DELTASONE  Take 3 tabs daily for 3 days, then 2 tabs daily for 3 days, then 1 tab daily for 3 days, then stop.     pregabalin 75 MG capsule  Commonly known as:  LYRICA  Take 75 mg by mouth at bedtime.     promethazine 25 MG tablet  Commonly known as:  PHENERGAN  Take 25 mg by mouth every 4 (four) hours as needed for nausea.     saccharomyces boulardii 250 MG capsule  Commonly known as:  FLORASTOR  Take 250 mg by mouth 2 (two) times daily.     senna-docusate 8.6-50 MG per tablet  Commonly known as:  Senokot-S  Take 2 tablets by mouth every other day.     vitamin B-12 1000 MCG tablet  Commonly known as:  CYANOCOBALAMIN  Take 1,000 mcg by mouth every evening.     Vitamin D-3 1000 UNITS Caps  Take 1,000 Units by mouth daily.        Chamille Werntz, Elmarie Mainland, RN

## 2012-11-29 LAB — BASIC METABOLIC PANEL: Sodium: 135 mmol/L — AB (ref 137–147)

## 2013-01-02 ENCOUNTER — Non-Acute Institutional Stay (SKILLED_NURSING_FACILITY): Payer: Medicare Other | Admitting: Nurse Practitioner

## 2013-01-02 DIAGNOSIS — B372 Candidiasis of skin and nail: Secondary | ICD-10-CM | POA: Insufficient documentation

## 2013-01-02 DIAGNOSIS — M792 Neuralgia and neuritis, unspecified: Secondary | ICD-10-CM | POA: Insufficient documentation

## 2013-01-02 DIAGNOSIS — G8100 Flaccid hemiplegia affecting unspecified side: Secondary | ICD-10-CM | POA: Insufficient documentation

## 2013-01-02 DIAGNOSIS — M25561 Pain in right knee: Secondary | ICD-10-CM

## 2013-01-02 DIAGNOSIS — M25569 Pain in unspecified knee: Secondary | ICD-10-CM | POA: Insufficient documentation

## 2013-01-02 DIAGNOSIS — I10 Essential (primary) hypertension: Secondary | ICD-10-CM

## 2013-01-02 DIAGNOSIS — M1990 Unspecified osteoarthritis, unspecified site: Secondary | ICD-10-CM | POA: Insufficient documentation

## 2013-01-02 DIAGNOSIS — G4733 Obstructive sleep apnea (adult) (pediatric): Secondary | ICD-10-CM | POA: Insufficient documentation

## 2013-01-02 DIAGNOSIS — E669 Obesity, unspecified: Secondary | ICD-10-CM

## 2013-01-02 DIAGNOSIS — R32 Unspecified urinary incontinence: Secondary | ICD-10-CM | POA: Insufficient documentation

## 2013-01-02 DIAGNOSIS — D429 Neoplasm of uncertain behavior of meninges, unspecified: Secondary | ICD-10-CM | POA: Insufficient documentation

## 2013-01-02 DIAGNOSIS — E559 Vitamin D deficiency, unspecified: Secondary | ICD-10-CM | POA: Insufficient documentation

## 2013-01-02 DIAGNOSIS — F070 Personality change due to known physiological condition: Secondary | ICD-10-CM

## 2013-01-02 DIAGNOSIS — F329 Major depressive disorder, single episode, unspecified: Secondary | ICD-10-CM | POA: Insufficient documentation

## 2013-01-02 DIAGNOSIS — E785 Hyperlipidemia, unspecified: Secondary | ICD-10-CM | POA: Insufficient documentation

## 2013-01-02 DIAGNOSIS — IMO0002 Reserved for concepts with insufficient information to code with codable children: Secondary | ICD-10-CM

## 2013-01-02 DIAGNOSIS — K5900 Constipation, unspecified: Secondary | ICD-10-CM

## 2013-01-02 DIAGNOSIS — E039 Hypothyroidism, unspecified: Secondary | ICD-10-CM

## 2013-01-02 DIAGNOSIS — R609 Edema, unspecified: Secondary | ICD-10-CM

## 2013-01-02 DIAGNOSIS — D649 Anemia, unspecified: Secondary | ICD-10-CM | POA: Insufficient documentation

## 2013-01-02 NOTE — Progress Notes (Signed)
Subjective:    Patient ID: [name], female    DOB: 1929-09-03, 79 y.o.   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 No recent followup at [facility] by her prior surgeon  244.9-HYPOTHYROIDISM  takes Levothyroxine 50 mcg since 03/07/12(71mcg started 07/15/11), TSH 2.405 05/31/12  250.00-DM, UNCOMPLICATED TYPE II  on Glipizide 5mg  bid, Metformin 500mg  bid   268-VITAMIN D DEFICIENCY  on Vitamin D, D level 30.2 03/04/10  272.4-HYPERLIPIDEMIA  off Pravachol 04/06/12. LDL at goal 102 07/05/12  285.9-ANEMIA The anemia has improved. Hgb 11.8 05/18/12 on B12, off Iron.   296.20-DEPRESSION, ACUTE MAJOR  Prozac. Although she lists Prozac as a drug intolerance, she says she can tolerate the current form that she takes. Failed discontinuation and resumed. No issue in mood, but c/o trouble sleep and energy, picks at scabs on nose, rubs eyes, pulls at finger nails/cuticles and skin opening--better after Prozac was increased to 20mg  daily.   310.1-MEMORY DISTURBANCE, MILD  MMSE 24/30, Namenda started 04/06/12  327.23-OBSTRUCTIVE SLEEP APNEA (ADULT) (PEDIATRIC)  C-PAP at night--not used.   401.9-HTN UNSPECIFIED  not on meds. controlled.   486-PNEUMONIA   resolved  Last hospitalization was in 11/2012  530.11-GERD  stable on Prilosec, pt has hx of nausea treated with Phenergan prn-effective. Failed Omeprazole discontinuation.  564.00-CONSTIPATION  chronic, stable on MiraLax and Senna.   715.90-OSTEOARTHRITIS  left shoulder reduced ROM, left leg/knee weaker then the right with decreased ROM, pain with wt bearing, multiple steroids joints injections-no longer effective Dr. Lequita Halt. Taking Lyrica-it was decreased due to c/o excessive drowsiness.  719.46-PAIN IN JOINT, KNEE  left leg/knee weaker then the right with decreased ROM, pain with wt bearing, multiple steroids joints injections-no longer effective Dr. Lequita Halt. 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 This resulted in gait instability. She gets around in wheelchair. She is ataxic on the left side.   Review of Systems  Constitutional: Positive for fatigue. Negative for fever, chills, diaphoresis, activity change, appetite change and unexpected weight change.  HENT: Positive for hearing loss, rhinorrhea and trouble swallowing. Negative for ear pain, congestion, neck pain, neck stiffness and sinus pressure.   Eyes: Negative for pain, discharge and itching.  Respiratory: Positive for cough. Negative for choking, chest tightness, shortness of breath and wheezing.   Cardiovascular: Positive for leg swelling (chronic mostly in the left ankle. ). Negative for chest pain and palpitations.  Gastrointestinal: Negative for vomiting, abdominal pain, constipation and abdominal distention.  Endocrine:       Been treated for diabetes and hypothyroidism  Genitourinary: Positive for urgency (has benn chronic) and frequency. Negative for dysuria and flank pain.  Musculoskeletal: Positive for back pain, arthralgias and gait problem (non ambulatory and mechanical left for transfer).  Skin: Positive for rash (teos red and itches). Negative for wound.  Allergic/Immunologic: Negative.   Neurological: Positive for facial asymmetry (mild), weakness and numbness. Negative for tremors (left side, muscle strength 5/5), seizures, syncope and speech difficulty.  Hematological: Negative.   Psychiatric/Behavioral: Positive for confusion. Negative for behavioral problems, sleep disturbance, dysphoric mood and agitation.  Objective:   Physical Exam   Constitutional: She is oriented to person, place, and time. She appears well-developed and well-nourished.  HENT:  Head: Normocephalic and atraumatic.  Eyes: Conjunctivae and EOM are normal. Pupils are equal, round, and reactive to light.  Neck: Normal range of motion. Neck supple. No JVD present. No thyromegaly present.  Cardiovascular: Normal rate, regular rhythm and normal heart sounds.   No murmur heard. Pulmonary/Chest: Effort normal. No respiratory distress. She has no wheezes. She has rales (bibasilar dry rales. ). She exhibits no tenderness.  Abdominal: Soft. Bowel sounds are normal. She exhibits no distension. There is no tenderness.  Musculoskeletal: Normal range of motion. She exhibits no edema and no tenderness.  Lymphadenopathy:    She has no cervical adenopathy.  Neurological: She is alert and oriented to person, place, and time. She has normal reflexes. She displays normal reflexes. No cranial nerve deficit. She exhibits abnormal muscle tone (left sided is weaker than the right with muscle strength 5/5). Coordination abnormal.  Skin: Rash noted.  Surgical incision behind left era. Toes are red and itches  Psychiatric: She has a normal mood and affect. Her behavior is normal. Judgment and thought content normal.          Assessment & Plan:    . Diabetes mellitus Continue Glipizide and Metformin, CBG monitoring.   Marland Kitchen Hypertension Controlled.   . Leukocytosis F/u CBC--it should resolved, related to recent PNA, UTI,  Prednisone, and stress  . Hypokalemia resolved  . Neoplasm of uncertain behavior of meninges S/p surgery and residual symptoms of left side weakness and inability to walk.   Marland Kitchen Unspecified hypothyroidism Corrected, last TSH 1.973 11/15/12  . Unspecified vitamin D deficiency Continue Vit D  . Other and unspecified hyperlipidemia Dit control  . Anemia, unspecified Stable, Hgb 11.6 last checked. Continue Vit B12  . Major depressive disorder,  single episode, unspecified Stable.    . Obstructive sleep apnea (adult) (pediatric) Stopped C-PAP on her own.   . Reflux esophagitis Stable on Omeprazole   . Unspecified constipation Stable on Senna and MiraLax.   . Osteoarthrosis, unspecified whether generalized or localized, unspecified site Lyrica 75mg  helped  . Pain in joint, lower leg  Not bothering much since she is no longer walking, prn Tylenol is adequate.   Marland Kitchen Neuralgia, neuritis, and radiculitis, unspecified Lyrica 75mg  is adequate.   . Edema Trace, mainly in the left ankle.   Marland Kitchen Unspecified urinary incontinence Persisted    Candidiasis toes Mycolog II nightly for 2 weeks.

## 2013-01-03 LAB — CBC AND DIFFERENTIAL: HCT: 33 % — AB (ref 36–46)

## 2013-01-03 LAB — BASIC METABOLIC PANEL
BUN: 18 mg/dL (ref 4–21)
Glucose: 206 mg/dL
Potassium: 4.1 mmol/L (ref 3.4–5.3)

## 2013-01-12 ENCOUNTER — Non-Acute Institutional Stay (SKILLED_NURSING_FACILITY): Payer: Medicare Other | Admitting: Nurse Practitioner

## 2013-01-12 DIAGNOSIS — D429 Neoplasm of uncertain behavior of meninges, unspecified: Secondary | ICD-10-CM

## 2013-01-12 DIAGNOSIS — M1990 Unspecified osteoarthritis, unspecified site: Secondary | ICD-10-CM

## 2013-01-12 DIAGNOSIS — R609 Edema, unspecified: Secondary | ICD-10-CM

## 2013-01-12 DIAGNOSIS — D649 Anemia, unspecified: Secondary | ICD-10-CM

## 2013-01-12 DIAGNOSIS — K2100 Gastro-esophageal reflux disease with esophagitis, without bleeding: Secondary | ICD-10-CM

## 2013-01-12 DIAGNOSIS — F329 Major depressive disorder, single episode, unspecified: Secondary | ICD-10-CM

## 2013-01-12 DIAGNOSIS — E785 Hyperlipidemia, unspecified: Secondary | ICD-10-CM

## 2013-01-12 DIAGNOSIS — E119 Type 2 diabetes mellitus without complications: Secondary | ICD-10-CM

## 2013-01-12 DIAGNOSIS — F0390 Unspecified dementia without behavioral disturbance: Secondary | ICD-10-CM

## 2013-01-12 DIAGNOSIS — E559 Vitamin D deficiency, unspecified: Secondary | ICD-10-CM

## 2013-01-12 DIAGNOSIS — K5900 Constipation, unspecified: Secondary | ICD-10-CM

## 2013-01-12 DIAGNOSIS — E039 Hypothyroidism, unspecified: Secondary | ICD-10-CM

## 2013-01-12 DIAGNOSIS — G8100 Flaccid hemiplegia affecting unspecified side: Secondary | ICD-10-CM

## 2013-01-12 NOTE — Assessment & Plan Note (Signed)
- 

## 2013-01-12 NOTE — Assessment & Plan Note (Signed)
Prior surgery, no recent f/u at [facility] by her prior surgeon

## 2013-01-12 NOTE — Assessment & Plan Note (Signed)
Left sided even if muscle strength 4-5/5  

## 2013-01-12 NOTE — Assessment & Plan Note (Signed)
Left shoulder reduced ROM, left leg/knee weaker the the right with decreased ROM, pain with weight bearing, multiples steroids inj --no longer effective per Dr. Aluisio. Pain is managed with Lyrica 75mg 

## 2013-01-12 NOTE — Assessment & Plan Note (Signed)
Diet controlled.  

## 2013-01-12 NOTE — Assessment & Plan Note (Signed)
Choking episode, diet modified to Nectar thick liquid and Purred diet.

## 2013-01-12 NOTE — Assessment & Plan Note (Signed)
Takes Levothyroxine 50mcg since 03/07/12, TSH 1.973 11/15/12       

## 2013-01-12 NOTE — Assessment & Plan Note (Signed)
Stable, continue Miralax and Senna.

## 2013-01-12 NOTE — Assessment & Plan Note (Signed)
Hgb 11.4 01/03/13

## 2013-01-12 NOTE — Assessment & Plan Note (Signed)
Supplemented with Vit D

## 2013-01-12 NOTE — Assessment & Plan Note (Signed)
Left ankle> the right, chronic, no change in my opinion.

## 2013-01-12 NOTE — Progress Notes (Signed)
Patient ID: Sonya Garrett, female   DOB: [birth year], 79 y.o.   MRN: [id number]  Chief Complaint:  Chief Complaint  Patient presents with  . Medical Managment of Chronic Issues    edema, s/p choking.      HPI:   NEOPLASM OF UNCERTAIN BEHAVIOR OF MENINGES  prior surgery. No recent followup at [facility] by her prior surgeon  HYPOTHYROIDISM  takes Levothyroxine 50 mcg since 03/07/12(38mcg started 07/15/11), TSH 2.405 05/31/12  DM, UNCOMPLICATED TYPE II  on Glipizide 5mg  bid, Metformin 500mg  bid   VITAMIN D DEFICIENCY  on Vitamin D, D level 30.2 03/04/10  HYPERLIPIDEMIA  off Pravachol 04/06/12. LDL at goal 102 07/05/12  ANEMIA The anemia has improved. Hgb 11.8 05/18/12 on B12, off Iron.   DEPRESSION, ACUTE MAJOR  Prozac. Although she lists Prozac as a drug intolerance, she says she can tolerate the current form that she takes. Failed discontinuation and resumed. No issue in mood, but c/o trouble sleep and energy, picks at scabs on nose, rubs eyes, pulls at finger nails/cuticles and skin opening--better after Prozac was increased to 20mg  daily.   MEMORY DISTURBANCE, MILD  MMSE 24/30, Namenda started 04/06/12  OBSTRUCTIVE SLEEP APNEA (ADULT) (PEDIATRIC)  C-PAP at night--not used.   HTN UNSPECIFIED  not on meds. controlled.   GERD  stable on Prilosec, pt has hx of nausea treated with Phenergan prn-effective. Failed Omeprazole discontinuation.  CONSTIPATION  chronic, stable on MiraLax and Senna.   OSTEOARTHRITIS  left shoulder reduced ROM, left leg/knee weaker then the right with decreased ROM, pain with wt bearing, multiple steroids joints injections-no longer effective Dr. Lequita Halt. Taking Lyrica-it was decreased due to c/o excessive drowsiness.  PAIN IN JOINT, KNEE  left leg/knee weaker then the right with decreased ROM, pain with wt bearing, multiple steroids joints injections-no longer effective Dr. Lequita Halt. Taking Tylenol   NEURALGIA  NEURITIS AND RADICULITIS UNSPECIFIED  Lyrica 75mg  hs  tongue  protrude to the right  EDEMA  chronic of lower extremities, L ankle> the right   INCONTINENCE, URINARY  unable to move fast enough to get to the bathroom timely  OBESITY, UNSPECIFIED      FLACID HEMIPLEGIA AFFECTING NONDOMINANT SIDE     weakness on the left side is the result of removal of meningioma in 2000. This resulted in gait instability. She gets around in wheelchair. She is ataxic on the left side.    Review of Systems:  Review of Systems  Constitutional: Positive for malaise/fatigue. Negative for fever, chills, weight loss and diaphoresis.  HENT: Positive for hearing loss. Negative for ear pain, congestion, sore throat and neck pain.   Eyes: Negative for pain, discharge and redness.  Respiratory: Positive for cough. Negative for sputum production, shortness of breath and wheezing.   Cardiovascular: Positive for leg swelling (chronic in ankles, L>R). Negative for chest pain, palpitations, orthopnea, claudication and PND.  Gastrointestinal: Negative for heartburn, nausea, vomiting, abdominal pain, diarrhea and constipation.  Genitourinary: Positive for frequency (chronic). Negative for dysuria, urgency and flank pain.  Musculoskeletal: Positive for myalgias, back pain and joint pain.  Skin: Negative for itching and rash.  Neurological: Positive for focal weakness (left sided weakness grip strength 4-5/5) and weakness (general and left sided. ). Negative for dizziness, tingling, tremors, speech change, seizures, loss of consciousness and headaches.  Endo/Heme/Allergies: Negative for environmental allergies and polydipsia. Does not bruise/bleed easily.  Psychiatric/Behavioral: Positive for memory loss. Negative for depression and hallucinations. The patient is not nervous/anxious and does not have insomnia.  Medications: Patient's Medications  New Prescriptions   No medications on file  Previous Medications   ACETAMINOPHEN (TYLENOL) 325 MG TABLET    Take 650 mg by mouth every 6  (six) hours as needed for pain.   ALBUTEROL (PROVENTIL) (5 MG/ML) 0.5% NEBULIZER SOLUTION    Take 0.5 mLs (2.5 mg total) by nebulization every 2 (two) hours as needed for wheezing or shortness of breath.   ALBUTEROL (PROVENTIL) (5 MG/ML) 0.5% NEBULIZER SOLUTION    Take 0.5 mLs (2.5 mg total) by nebulization every 6 (six) hours.   CALCIUM CARBONATE (TUMS - DOSED IN MG ELEMENTAL CALCIUM) 500 MG CHEWABLE TABLET    Chew 1 tablet by mouth 2 (two) times daily as needed for heartburn.   CHOLECALCIFEROL (VITAMIN D-3) 1000 UNITS CAPS    Take 1,000 Units by mouth daily.   FLUOXETINE (PROZAC) 20 MG/5ML SOLUTION    Take 20 mg by mouth every evening.   GLIPIZIDE (GLUCOTROL) 5 MG TABLET    Take 5 mg by mouth 2 (two) times daily before a meal.   GUAIFENESIN-DEXTROMETHORPHAN (ROBITUSSIN DM) 100-10 MG/5ML SYRUP    Take 5 mLs by mouth every 4 (four) hours as needed for cough.   HYDROXYPROPYL METHYLCELLULOSE (ISOPTO TEARS) 2.5 % OPHTHALMIC SOLUTION    Place 1 drop into both eyes 2 (two) times daily.   IPRATROPIUM (ATROVENT) 0.02 % NEBULIZER SOLUTION    Take 2.5 mLs (0.5 mg total) by nebulization every 6 (six) hours.   LEVOFLOXACIN (LEVAQUIN) 500 MG TABLET    Take 1 tablet (500 mg total) by mouth daily.   LEVOTHYROXINE (SYNTHROID, LEVOTHROID) 50 MCG TABLET    Take 50 mcg by mouth daily.   MEMANTINE (NAMENDA) 10 MG TABLET    Take 10 mg by mouth 2 (two) times daily.   METFORMIN (GLUCOPHAGE) 500 MG TABLET    Take 500 mg by mouth 2 (two) times daily with a meal.   NYSTATIN (MYCOSTATIN/NYSTOP) 100000 UNIT/GM POWD    Apply 1 g topically 2 (two) times daily as needed (applied to skin folds twice daily as needed for rash).   NYSTATIN-TRIAMCINOLONE (MYCOLOG II) CREAM    Apply 1 application topically at bedtime. To perineal area   OMEPRAZOLE (PRILOSEC) 20 MG CAPSULE    Take 20 mg by mouth daily.   POLYETHYLENE GLYCOL (MIRALAX / GLYCOLAX) PACKET    Take 17 g by mouth daily.   PREDNISONE (DELTASONE) 10 MG TABLET    Take 3 tabs  daily for 3 days, then 2 tabs daily for 3 days, then 1 tab daily for 3 days, then stop.   PREGABALIN (LYRICA) 75 MG CAPSULE    Take 75 mg by mouth at bedtime.   PROMETHAZINE (PHENERGAN) 25 MG TABLET    Take 25 mg by mouth every 4 (four) hours as needed for nausea.   SACCHAROMYCES BOULARDII (FLORASTOR) 250 MG CAPSULE    Take 250 mg by mouth 2 (two) times daily.   SENNA-DOCUSATE (SENOKOT-S) 8.6-50 MG PER TABLET    Take 2 tablets by mouth every other day.   VITAMIN B-12 (CYANOCOBALAMIN) 1000 MCG TABLET    Take 1,000 mcg by mouth every evening.  Modified Medications   No medications on file  Discontinued Medications   No medications on file     Physical Exam: Physical Exam  Constitutional: She is oriented to person, place, and time. She appears well-developed and well-nourished.  HENT:  Head: Normocephalic and atraumatic.  Eyes: Conjunctivae and EOM are normal. Pupils are equal, round,  and reactive to light.  Neck: Normal range of motion. Neck supple. No JVD present. No thyromegaly present.  Cardiovascular: Normal rate, regular rhythm and normal heart sounds.   No murmur heard. Pulmonary/Chest: Effort normal. No respiratory distress. She has no wheezes. She has rales (bibasilar dry rales. ). She exhibits no tenderness.  Abdominal: Soft. Bowel sounds are normal. She exhibits no distension. There is no tenderness.  Musculoskeletal: Normal range of motion. She exhibits edema (ankle L>R). She exhibits no tenderness.  Lymphadenopathy:    She has no cervical adenopathy.  Neurological: She is alert and oriented to person, place, and time. She has normal reflexes. She displays normal reflexes. No cranial nerve deficit. She exhibits abnormal muscle tone (left sided is weaker than the right with muscle strength 5/5). Coordination abnormal.  Skin: Rash noted.  Surgical incision behind left era. Toes are red and itches  Psychiatric: She has a normal mood and affect. Her behavior is normal. Judgment and  thought content normal.     Filed Vitals:   01/09/13 1600  BP: 124/80  Pulse: 72  Temp: 97.2 F (36.2 C)  TempSrc: Tympanic  Resp: 20      Labs reviewed: Basic Metabolic Panel:  Recent Labs  16/10/96  11/22/12 0600 11/23/12 0547 11/24/12 0528 11/29/12 01/03/13  NA  --   < > 140 138 138 135*  --   K  --   < > 3.8 3.0* 3.5  --  4.1  CL  --   < > 101 100 101  --   --   CO2  --   < > 28 26 30   --   --   GLUCOSE  --   < > 149* 313* 190*  --   --   BUN  --   < > 9 14 18   --  18  CREATININE  --   < > 0.72 0.63 0.77  --  0.8  CALCIUM  --   < > 8.7 8.6 9.0  --   --   TSH 1.97  --   --   --   --   --   --   < > = values in this interval not displayed.  Liver Function Tests:  Recent Labs  11/23/12 0547 11/24/12 0528 11/25/12 0525  AST 24 16 14   ALT 93* 66* 52*  ALKPHOS 166* 134* 135*  BILITOT 0.2* 0.2* 0.2*  PROT 7.1 6.7 6.9  ALBUMIN 2.9* 2.8* 3.0*    CBC:  Recent Labs  11/18/12 2321  11/21/12 0551 11/22/12 0600 11/23/12 0547 01/03/13  WBC 12.1*  < > 7.0 6.9 7.3 10.7  NEUTROABS 11.4*  --  5.4  --   --   --   HGB 11.0*  < > 10.8* 10.9* 11.2* 11.4*  HCT 32.3*  < > 32.5* 32.8* 33.8* 33*  MCV 83.5  < > 84.0 84.1 82.8  --   PLT 194  < > 182 184 206 267  < > = values in this interval not displayed.  Significant Diagnostic Results:     Assessment/Plan Neoplasm of uncertain behavior of meninges Prior surgery, no recent f/u at [facility] by her prior surgeon  Unspecified hypothyroidism Takes Levothyroxine since 03/07/12, TSH 1.973 11/15/12  Unspecified vitamin D deficiency Supplemented with Vit D  Other and unspecified hyperlipidemia Diet controlled  Anemia, unspecified Hgb 11.4 01/03/13  Major depressive disorder, single episode, unspecified Stable on Prozac 20mg    Reflux esophagitis Choking episode, diet modified  to Nectar thick liquid and Purred diet.   Unspecified constipation Stable, continue Miralax and Senna.   Osteoarthrosis,  unspecified whether generalized or localized, unspecified site Left shoulder reduced ROM, left leg/knee weaker the the right with decreased ROM, pain with weight bearing, multiples steroids inj --no longer effective per Dr. Lequita Halt. Pain is managed with Lyrica 75mg   Edema Left ankle> the right, chronic, no change in my opinion.   Flaccid hemiplegia affecting nondominant side Left sided even if muscle strength 4-5/5  Diabetes mellitus Takes Glipizide 5mg  and Metformin 500mg  bid. Will check Hgb A1c  Dementia, in, senility Stable, continue Namenda      Family/ staff Communication: supervision with eating.    Goals of care: safe swallow.    Labs/tests ordered Hgb A1c

## 2013-01-12 NOTE — Assessment & Plan Note (Signed)
Takes Glipizide 5mg  and Metformin 500mg  bid. Will check Hgb A1c

## 2013-01-12 NOTE — Assessment & Plan Note (Signed)
Stable, continue Namenda

## 2013-02-06 ENCOUNTER — Non-Acute Institutional Stay (SKILLED_NURSING_FACILITY): Payer: Medicare Other | Admitting: Nurse Practitioner

## 2013-02-06 DIAGNOSIS — F329 Major depressive disorder, single episode, unspecified: Secondary | ICD-10-CM

## 2013-02-06 DIAGNOSIS — R609 Edema, unspecified: Secondary | ICD-10-CM

## 2013-02-06 DIAGNOSIS — F0390 Unspecified dementia without behavioral disturbance: Secondary | ICD-10-CM

## 2013-02-06 DIAGNOSIS — E119 Type 2 diabetes mellitus without complications: Secondary | ICD-10-CM

## 2013-02-06 DIAGNOSIS — E039 Hypothyroidism, unspecified: Secondary | ICD-10-CM

## 2013-02-06 DIAGNOSIS — I10 Essential (primary) hypertension: Secondary | ICD-10-CM

## 2013-02-06 NOTE — Assessment & Plan Note (Signed)
Stable, continue Namenda

## 2013-02-06 NOTE — Assessment & Plan Note (Signed)
Takes Levothyroxine since 03/07/12, TSH 1.973 11/15/12

## 2013-02-06 NOTE — Assessment & Plan Note (Signed)
Controlled w/o antihypertensive agent.   

## 2013-02-06 NOTE — Assessment & Plan Note (Signed)
- 

## 2013-02-06 NOTE — Assessment & Plan Note (Signed)
Takes Glipizide 5mg and Metformin 500mg bid. Hgb A1c 6.8 01/17/13                 

## 2013-02-06 NOTE — Progress Notes (Signed)
Patient ID: Sonya Garrett, female   DOB: [birth year], 79 y.o.   MRN: [id number]  Chief Complaint:  Chief Complaint  Patient presents with  . Medical Managment of Chronic Issues     HPI:    Problem List Items Addressed This Visit     ICD-9-CM   Hypertension     Controlled w/o antihypertensive agent.     Unspecified hypothyroidism     Takes Levothyroxine since 03/07/12, TSH 1.973 11/15/12      Major depressive disorder, single episode, unspecified     Stable on Prozac 20mg        Edema     Left ankle> the right, chronic, will weight daily ac breakfast.       Dementia, in, senility     Stable, continue Namenda      Diabetes mellitus - Primary (Chronic)     Takes Glipizide 5mg  and Metformin 500mg  bid. Hgb A1c 6.8 01/17/13         Review of Systems: ROS   Medications: Patient's Medications  New Prescriptions   No medications on file  Previous Medications   ACETAMINOPHEN (TYLENOL) 325 MG TABLET    Take 650 mg by mouth every 6 (six) hours as needed for pain.   ALBUTEROL (PROVENTIL) (5 MG/ML) 0.5% NEBULIZER SOLUTION    Take 0.5 mLs (2.5 mg total) by nebulization every 2 (two) hours as needed for wheezing or shortness of breath.   ALBUTEROL (PROVENTIL) (5 MG/ML) 0.5% NEBULIZER SOLUTION    Take 0.5 mLs (2.5 mg total) by nebulization every 6 (six) hours.   CALCIUM CARBONATE (TUMS - DOSED IN MG ELEMENTAL CALCIUM) 500 MG CHEWABLE TABLET    Chew 1 tablet by mouth 2 (two) times daily as needed for heartburn.   CHOLECALCIFEROL (VITAMIN D-3) 1000 UNITS CAPS    Take 1,000 Units by mouth daily.   FLUOXETINE (PROZAC) 20 MG/5ML SOLUTION    Take 20 mg by mouth every evening.   GLIPIZIDE (GLUCOTROL) 5 MG TABLET    Take 5 mg by mouth 2 (two) times daily before a meal.   GUAIFENESIN-DEXTROMETHORPHAN (ROBITUSSIN DM) 100-10 MG/5ML SYRUP    Take 5 mLs by mouth every 4 (four) hours as needed for cough.   HYDROXYPROPYL METHYLCELLULOSE (ISOPTO TEARS) 2.5 % OPHTHALMIC SOLUTION    Place  1 drop into both eyes 2 (two) times daily.   IPRATROPIUM (ATROVENT) 0.02 % NEBULIZER SOLUTION    Take 2.5 mLs (0.5 mg total) by nebulization every 6 (six) hours.   LEVOFLOXACIN (LEVAQUIN) 500 MG TABLET    Take 1 tablet (500 mg total) by mouth daily.   LEVOTHYROXINE (SYNTHROID, LEVOTHROID) 50 MCG TABLET    Take 50 mcg by mouth daily.   MEMANTINE (NAMENDA) 10 MG TABLET    Take 10 mg by mouth 2 (two) times daily.   METFORMIN (GLUCOPHAGE) 500 MG TABLET    Take 500 mg by mouth 2 (two) times daily with a meal.   NYSTATIN (MYCOSTATIN/NYSTOP) 100000 UNIT/GM POWD    Apply 1 g topically 2 (two) times daily as needed (applied to skin folds twice daily as needed for rash).   NYSTATIN-TRIAMCINOLONE (MYCOLOG II) CREAM    Apply 1 application topically at bedtime. To perineal area   OMEPRAZOLE (PRILOSEC) 20 MG CAPSULE    Take 20 mg by mouth daily.   POLYETHYLENE GLYCOL (MIRALAX / GLYCOLAX) PACKET    Take 17 g by mouth daily.   PREDNISONE (DELTASONE) 10 MG TABLET    Take 3 tabs daily  for 3 days, then 2 tabs daily for 3 days, then 1 tab daily for 3 days, then stop.   PREGABALIN (LYRICA) 75 MG CAPSULE    Take 75 mg by mouth at bedtime.   PROMETHAZINE (PHENERGAN) 25 MG TABLET    Take 25 mg by mouth every 4 (four) hours as needed for nausea.   SACCHAROMYCES BOULARDII (FLORASTOR) 250 MG CAPSULE    Take 250 mg by mouth 2 (two) times daily.   SENNA-DOCUSATE (SENOKOT-S) 8.6-50 MG PER TABLET    Take 2 tablets by mouth every other day.   VITAMIN B-12 (CYANOCOBALAMIN) 1000 MCG TABLET    Take 1,000 mcg by mouth every evening.  Modified Medications   No medications on file  Discontinued Medications   No medications on file     Physical Exam: Physical Exam  Constitutional: She is oriented to person, place, and time. She appears well-developed and well-nourished.  HENT:  Head: Normocephalic and atraumatic.  Eyes: Conjunctivae and EOM are normal. Pupils are equal, round, and reactive to light.  Neck: Normal range of  motion. Neck supple. No JVD present. No thyromegaly present.  Cardiovascular: Normal rate, regular rhythm and normal heart sounds.   No murmur heard. Pulmonary/Chest: Effort normal. No respiratory distress. She has no wheezes. She has rales (bibasilar dry rales. ). She exhibits no tenderness.  Abdominal: Soft. Bowel sounds are normal. She exhibits no distension. There is no tenderness.  Musculoskeletal: Normal range of motion. She exhibits edema (ankle L>R). She exhibits no tenderness.  Lymphadenopathy:    She has no cervical adenopathy.  Neurological: She is alert and oriented to person, place, and time. She has normal reflexes. No cranial nerve deficit. She exhibits abnormal muscle tone (left sided is weaker than the right with muscle strength 5/5). Coordination abnormal.  Skin: Rash noted.  Surgical incision behind left era. Toes are red and itches  Psychiatric: She has a normal mood and affect. Her behavior is normal. Judgment and thought content normal.     Filed Vitals:   02/06/13 1205  BP: 132/76  Pulse: 70  Temp: 96.7 F (35.9 C)  TempSrc: Tympanic  Resp: 16      Labs reviewed: Basic Metabolic Panel:  Recent Labs  16/10/96  11/22/12 0600 11/23/12 0547 11/24/12 0528 11/29/12 01/03/13  NA  --   < > 140 138 138 135*  --   K  --   < > 3.8 3.0* 3.5  --  4.1  CL  --   < > 101 100 101  --   --   CO2  --   < > 28 26 30   --   --   GLUCOSE  --   < > 149* 313* 190*  --   --   BUN  --   < > 9 14 18   --  18  CREATININE  --   < > 0.72 0.63 0.77  --  0.8  CALCIUM  --   < > 8.7 8.6 9.0  --   --   TSH 1.97  --   --   --   --   --   --   < > = values in this interval not displayed.  Liver Function Tests:  Recent Labs  11/23/12 0547 11/24/12 0528 11/25/12 0525  AST 24 16 14   ALT 93* 66* 52*  ALKPHOS 166* 134* 135*  BILITOT 0.2* 0.2* 0.2*  PROT 7.1 6.7 6.9  ALBUMIN 2.9* 2.8* 3.0*    CBC:  Recent Labs  11/18/12 2321  11/21/12 0551 11/22/12 0600 11/23/12 0547  01/03/13  WBC 12.1*  < > 7.0 6.9 7.3 10.7  NEUTROABS 11.4*  --  5.4  --   --   --   HGB 11.0*  < > 10.8* 10.9* 11.2* 11.4*  HCT 32.3*  < > 32.5* 32.8* 33.8* 33*  MCV 83.5  < > 84.0 84.1 82.8  --   PLT 194  < > 182 184 206 267  < > = values in this interval not displayed.  Anemia Panel: No results found for this basename: IRON, FOLATE, VITAMINB12,  in the last 8760 hours  Significant Diagnostic Results:     Assessment/Plan Diabetes mellitus Takes Glipizide 5mg  and Metformin 500mg  bid. Hgb A1c 6.8 01/17/13    Hypertension Controlled w/o antihypertensive agent.   Unspecified hypothyroidism Takes Levothyroxine since 03/07/12, TSH 1.973 11/15/12    Major depressive disorder, single episode, unspecified Stable on Prozac 20mg      Dementia, in, senility Stable, continue Namenda    Edema Left ankle> the right, chronic, will weight daily ac breakfast.         Family/ staff Communication: monitor the patient.   Goals of care: SNF   Labs/tests ordered none

## 2013-02-06 NOTE — Assessment & Plan Note (Signed)
Left ankle> the right, chronic, will weight daily ac breakfast.

## 2013-03-02 ENCOUNTER — Non-Acute Institutional Stay (SKILLED_NURSING_FACILITY): Payer: Medicare Other | Admitting: Nurse Practitioner

## 2013-03-02 DIAGNOSIS — F329 Major depressive disorder, single episode, unspecified: Secondary | ICD-10-CM

## 2013-03-02 DIAGNOSIS — E039 Hypothyroidism, unspecified: Secondary | ICD-10-CM

## 2013-03-02 DIAGNOSIS — E119 Type 2 diabetes mellitus without complications: Secondary | ICD-10-CM

## 2013-03-02 DIAGNOSIS — IMO0002 Reserved for concepts with insufficient information to code with codable children: Secondary | ICD-10-CM

## 2013-03-02 DIAGNOSIS — N390 Urinary tract infection, site not specified: Secondary | ICD-10-CM

## 2013-03-02 NOTE — Assessment & Plan Note (Signed)
- 

## 2013-03-02 NOTE — Assessment & Plan Note (Signed)
Urine culture 03/01/13 showed Viridans Streptococcus 80,000c/ml--will start Augmentin 875mg  bid for total 7 days along with FloraStor bid.

## 2013-03-02 NOTE — Assessment & Plan Note (Signed)
Takes Glipizide 5mg  and Metformin 500mg  bid. Hgb A1c 6.8 01/17/13

## 2013-03-02 NOTE — Assessment & Plan Note (Signed)
Choking episode, diet modified to Nectar thick liquid and Purred diet.  

## 2013-03-02 NOTE — Assessment & Plan Note (Signed)
Stable, continue Namenda

## 2013-03-02 NOTE — Assessment & Plan Note (Signed)
Pain is managed with Lyrica 75mg   

## 2013-03-02 NOTE — Progress Notes (Signed)
Patient ID: Sonya Garrett, female   DOB: 18-Apr-1929, 79 y.o.   MRN: [id number]  Chief Complaint:  Chief Complaint  Patient presents with  . Medical Managment of Chronic Issues    UTI     HPI:   Problem List Items Addressed This Visit   UTI (lower urinary tract infection) - Primary (Chronic)     Urine culture 03/01/13 showed Viridans Streptococcus 80,000c/ml--will start Augmentin 875mg  bid for total 7 days along with FloraStor bid.     Diabetes mellitus (Chronic)     Takes Glipizide 5mg  and Metformin 500mg  bid. Hgb A1c 6.8 01/17/13        Unspecified hypothyroidism     Takes Levothyroxine since 03/07/12, TSH 1.973 11/15/12        Major depressive disorder, single episode, unspecified     Stable on Prozac 20mg          Reflux esophagitis     Choking episode, diet modified to Nectar thick liquid and Purred diet.       Neuralgia, neuritis, and radiculitis, unspecified     Pain is managed with Lyrica 75mg        Review of Systems:  Review of Systems  Constitutional: Positive for malaise/fatigue. Negative for fever, chills, weight loss and diaphoresis.  HENT: Positive for hearing loss. Negative for ear pain, congestion, sore throat and neck pain.   Eyes: Negative for pain, discharge and redness.  Respiratory: Positive for cough. Negative for sputum production, shortness of breath and wheezing.   Cardiovascular: Positive for leg swelling (chronic in ankles, L>R). Negative for chest pain, palpitations, orthopnea, claudication and PND.  Gastrointestinal: Negative for heartburn, nausea, vomiting, abdominal pain, diarrhea and constipation.  Genitourinary: Positive for frequency (chronic). Negative for dysuria, urgency and flank pain.  Musculoskeletal: Positive for myalgias, back pain and joint pain.  Skin: Negative for itching and rash.  Neurological: Positive for focal weakness (left sided weakness grip strength 4-5/5) and weakness (general and left sided. ). Negative  for dizziness, tingling, tremors, speech change, seizures, loss of consciousness and headaches.  Endo/Heme/Allergies: Negative for environmental allergies and polydipsia. Does not bruise/bleed easily.  Psychiatric/Behavioral: Positive for memory loss. Negative for depression and hallucinations. The patient is not nervous/anxious and does not have insomnia.      Medications: Patient's Medications  New Prescriptions   No medications on file  Previous Medications   ACETAMINOPHEN (TYLENOL) 325 MG TABLET    Take 650 mg by mouth every 6 (six) hours as needed for pain.   ALBUTEROL (PROVENTIL) (5 MG/ML) 0.5% NEBULIZER SOLUTION    Take 0.5 mLs (2.5 mg total) by nebulization every 2 (two) hours as needed for wheezing or shortness of breath.   ALBUTEROL (PROVENTIL) (5 MG/ML) 0.5% NEBULIZER SOLUTION    Take 0.5 mLs (2.5 mg total) by nebulization every 6 (six) hours.   CALCIUM CARBONATE (TUMS - DOSED IN MG ELEMENTAL CALCIUM) 500 MG CHEWABLE TABLET    Chew 1 tablet by mouth 2 (two) times daily as needed for heartburn.   CHOLECALCIFEROL (VITAMIN D-3) 1000 UNITS CAPS    Take 1,000 Units by mouth daily.   FLUOXETINE (PROZAC) 20 MG/5ML SOLUTION    Take 20 mg by mouth every evening.   GLIPIZIDE (GLUCOTROL) 5 MG TABLET    Take 5 mg by mouth 2 (two) times daily before a meal.   GUAIFENESIN-DEXTROMETHORPHAN (ROBITUSSIN DM) 100-10 MG/5ML SYRUP    Take 5 mLs by mouth every 4 (four) hours as needed for cough.   HYDROXYPROPYL METHYLCELLULOSE (  ISOPTO TEARS) 2.5 % OPHTHALMIC SOLUTION    Place 1 drop into both eyes 2 (two) times daily.   IPRATROPIUM (ATROVENT) 0.02 % NEBULIZER SOLUTION    Take 2.5 mLs (0.5 mg total) by nebulization every 6 (six) hours.   LEVOFLOXACIN (LEVAQUIN) 500 MG TABLET    Take 1 tablet (500 mg total) by mouth daily.   LEVOTHYROXINE (SYNTHROID, LEVOTHROID) 50 MCG TABLET    Take 50 mcg by mouth daily.   MEMANTINE (NAMENDA) 10 MG TABLET    Take 10 mg by mouth 2 (two) times daily.   METFORMIN  (GLUCOPHAGE) 500 MG TABLET    Take 500 mg by mouth 2 (two) times daily with a meal.   NYSTATIN (MYCOSTATIN/NYSTOP) 100000 UNIT/GM POWD    Apply 1 g topically 2 (two) times daily as needed (applied to skin folds twice daily as needed for rash).   NYSTATIN-TRIAMCINOLONE (MYCOLOG II) CREAM    Apply 1 application topically at bedtime. To perineal area   OMEPRAZOLE (PRILOSEC) 20 MG CAPSULE    Take 20 mg by mouth daily.   POLYETHYLENE GLYCOL (MIRALAX / GLYCOLAX) PACKET    Take 17 g by mouth daily.   PREDNISONE (DELTASONE) 10 MG TABLET    Take 3 tabs daily for 3 days, then 2 tabs daily for 3 days, then 1 tab daily for 3 days, then stop.   PREGABALIN (LYRICA) 75 MG CAPSULE    Take 75 mg by mouth at bedtime.   PROMETHAZINE (PHENERGAN) 25 MG TABLET    Take 25 mg by mouth every 4 (four) hours as needed for nausea.   SACCHAROMYCES BOULARDII (FLORASTOR) 250 MG CAPSULE    Take 250 mg by mouth 2 (two) times daily.   SENNA-DOCUSATE (SENOKOT-S) 8.6-50 MG PER TABLET    Take 2 tablets by mouth every other day.   VITAMIN B-12 (CYANOCOBALAMIN) 1000 MCG TABLET    Take 1,000 mcg by mouth every evening.  Modified Medications   No medications on file  Discontinued Medications   No medications on file     Physical Exam: Physical Exam  Constitutional: She is oriented to person, place, and time. She appears well-developed and well-nourished.  HENT:  Head: Normocephalic and atraumatic.  Eyes: Conjunctivae and EOM are normal. Pupils are equal, round, and reactive to light.  Neck: Normal range of motion. Neck supple. No JVD present. No thyromegaly present.  Cardiovascular: Normal rate, regular rhythm and normal heart sounds.   No murmur heard. Pulmonary/Chest: Effort normal. No respiratory distress. She has no wheezes. She has rales (bibasilar dry rales. ). She exhibits no tenderness.  Abdominal: Soft. Bowel sounds are normal. She exhibits no distension. There is no tenderness.  Musculoskeletal: Normal range of  motion. She exhibits edema (ankle L>R). She exhibits no tenderness.  Lymphadenopathy:    She has no cervical adenopathy.  Neurological: She is alert and oriented to person, place, and time. She has normal reflexes. No cranial nerve deficit. She exhibits abnormal muscle tone (left sided is weaker than the right with muscle strength 5/5). Coordination abnormal.  Skin: Rash noted.  Surgical incision behind left era. Toes are red and itches  Psychiatric: She has a normal mood and affect. Her behavior is normal. Judgment and thought content normal.     Filed Vitals:   03/02/13 1414  BP: 132/76  Pulse: 70  Temp: 96.7 F (35.9 C)  TempSrc: Tympanic  Resp: 16      Labs reviewed: Basic Metabolic Panel:  Recent Labs  16/10/96  11/22/12 0600 11/23/12 0547 11/24/12 0528 11/29/12 01/03/13  NA  --   < > 140 138 138 135*  --   K  --   < > 3.8 3.0* 3.5  --  4.1  CL  --   < > 101 100 101  --   --   CO2  --   < > 28 26 30   --   --   GLUCOSE  --   < > 149* 313* 190*  --   --   BUN  --   < > 9 14 18   --  18  CREATININE  --   < > 0.72 0.63 0.77  --  0.8  CALCIUM  --   < > 8.7 8.6 9.0  --   --   TSH 1.97  --   --   --   --   --   --   < > = values in this interval not displayed.  Liver Function Tests:  Recent Labs  11/23/12 0547 11/24/12 0528 11/25/12 0525  AST 24 16 14   ALT 93* 66* 52*  ALKPHOS 166* 134* 135*  BILITOT 0.2* 0.2* 0.2*  PROT 7.1 6.7 6.9  ALBUMIN 2.9* 2.8* 3.0*    CBC:  Recent Labs  11/18/12 2321  11/21/12 0551 11/22/12 0600 11/23/12 0547 01/03/13  WBC 12.1*  < > 7.0 6.9 7.3 10.7  NEUTROABS 11.4*  --  5.4  --   --   --   HGB 11.0*  < > 10.8* 10.9* 11.2* 11.4*  HCT 32.3*  < > 32.5* 32.8* 33.8* 33*  MCV 83.5  < > 84.0 84.1 82.8  --   PLT 194  < > 182 184 206 267  < > = values in this interval not displayed.  Anemia Panel: No results found for this basename: IRON, FOLATE, VITAMINB12,  in the last 8760 hours  Significant Diagnostic  Results:     Assessment/Plan UTI (lower urinary tract infection) Urine culture 03/01/13 showed Viridans Streptococcus 80,000c/ml--will start Augmentin 875mg  bid for total 7 days along with FloraStor bid.   Diabetes mellitus Takes Glipizide 5mg  and Metformin 500mg  bid. Hgb A1c 6.8 01/17/13      Unspecified hypothyroidism Takes Levothyroxine since 03/07/12, TSH 1.973 11/15/12      Reflux esophagitis Choking episode, diet modified to Nectar thick liquid and Purred diet.     Neuralgia, neuritis, and radiculitis, unspecified Pain is managed with Lyrica 75mg   Dementia, in, senility Stable, continue Namenda      Major depressive disorder, single episode, unspecified Stable on Prozac 20mg            Family/ staff Communication: none   Goals of care: SNF   Labs/tests ordered none

## 2013-03-02 NOTE — Assessment & Plan Note (Signed)
Takes Levothyroxine 50mcg since 03/07/12, TSH 1.973 11/15/12       

## 2013-03-06 ENCOUNTER — Non-Acute Institutional Stay (SKILLED_NURSING_FACILITY): Payer: Medicare Other | Admitting: Nurse Practitioner

## 2013-03-06 DIAGNOSIS — R609 Edema, unspecified: Secondary | ICD-10-CM

## 2013-03-06 DIAGNOSIS — Z66 Do not resuscitate: Secondary | ICD-10-CM

## 2013-03-06 DIAGNOSIS — F329 Major depressive disorder, single episode, unspecified: Secondary | ICD-10-CM

## 2013-03-06 NOTE — Assessment & Plan Note (Signed)
Left ankle> the right, chronic, weight 187 Ibs--no significant weight change.

## 2013-03-06 NOTE — Assessment & Plan Note (Signed)
Stable, continue Namenda

## 2013-03-06 NOTE — Assessment & Plan Note (Signed)
- 

## 2013-03-06 NOTE — Progress Notes (Signed)
Patient ID: Sonya Garrett, female   DOB: [birth year], 79 y.o.   MRN: [id number]  Chief Complaint:  Chief Complaint  Patient presents with  . Medical Managment of Chronic Issues     HPI:    Problem List Items Addressed This Visit   Major depressive disorder, single episode, unspecified     Stable on Prozac 20mg            Edema     Left ankle> the right, chronic, weight 187 Ibs--no significant weight change.         DNR (do not resuscitate) - Primary      Review of Systems:  Review of Systems  Constitutional: Positive for malaise/fatigue. Negative for fever, chills, weight loss and diaphoresis.  HENT: Positive for hearing loss. Negative for ear pain, congestion, sore throat and neck pain.   Eyes: Negative for pain, discharge and redness.  Respiratory: Positive for cough. Negative for sputum production, shortness of breath and wheezing.   Cardiovascular: Positive for leg swelling (chronic in ankles, L>R). Negative for chest pain, palpitations, orthopnea, claudication and PND.  Gastrointestinal: Negative for heartburn, nausea, vomiting, abdominal pain, diarrhea and constipation.  Genitourinary: Positive for frequency (chronic). Negative for dysuria, urgency and flank pain.  Musculoskeletal: Positive for myalgias, back pain and joint pain.  Skin: Negative for itching and rash.  Neurological: Positive for focal weakness (left sided weakness grip strength 4-5/5) and weakness (general and left sided. ). Negative for dizziness, tingling, tremors, speech change, seizures, loss of consciousness and headaches.  Endo/Heme/Allergies: Negative for environmental allergies and polydipsia. Does not bruise/bleed easily.  Psychiatric/Behavioral: Positive for memory loss. Negative for depression and hallucinations. The patient is not nervous/anxious and does not have insomnia.      Medications: Patient's Medications  New Prescriptions   No medications on file  Previous Medications   ACETAMINOPHEN (TYLENOL) 325 MG TABLET    Take 650 mg by mouth every 6 (six) hours as needed for pain.   ALBUTEROL (PROVENTIL) (5 MG/ML) 0.5% NEBULIZER SOLUTION    Take 0.5 mLs (2.5 mg total) by nebulization every 2 (two) hours as needed for wheezing or shortness of breath.   ALBUTEROL (PROVENTIL) (5 MG/ML) 0.5% NEBULIZER SOLUTION    Take 0.5 mLs (2.5 mg total) by nebulization every 6 (six) hours.   CALCIUM CARBONATE (TUMS - DOSED IN MG ELEMENTAL CALCIUM) 500 MG CHEWABLE TABLET    Chew 1 tablet by mouth 2 (two) times daily as needed for heartburn.   CHOLECALCIFEROL (VITAMIN D-3) 1000 UNITS CAPS    Take 1,000 Units by mouth daily.   FLUOXETINE (PROZAC) 20 MG/5ML SOLUTION    Take 20 mg by mouth every evening.   GLIPIZIDE (GLUCOTROL) 5 MG TABLET    Take 5 mg by mouth 2 (two) times daily before a meal.   GUAIFENESIN-DEXTROMETHORPHAN (ROBITUSSIN DM) 100-10 MG/5ML SYRUP    Take 5 mLs by mouth every 4 (four) hours as needed for cough.   HYDROXYPROPYL METHYLCELLULOSE (ISOPTO TEARS) 2.5 % OPHTHALMIC SOLUTION    Place 1 drop into both eyes 2 (two) times daily.   IPRATROPIUM (ATROVENT) 0.02 % NEBULIZER SOLUTION    Take 2.5 mLs (0.5 mg total) by nebulization every 6 (six) hours.   LEVOFLOXACIN (LEVAQUIN) 500 MG TABLET    Take 1 tablet (500 mg total) by mouth daily.   LEVOTHYROXINE (SYNTHROID, LEVOTHROID) 50 MCG TABLET    Take 50 mcg by mouth daily.   MEMANTINE (NAMENDA) 10 MG TABLET    Take 10 mg by  mouth 2 (two) times daily.   METFORMIN (GLUCOPHAGE) 500 MG TABLET    Take 500 mg by mouth 2 (two) times daily with a meal.   NYSTATIN (MYCOSTATIN/NYSTOP) 100000 UNIT/GM POWD    Apply 1 g topically 2 (two) times daily as needed (applied to skin folds twice daily as needed for rash).   NYSTATIN-TRIAMCINOLONE (MYCOLOG II) CREAM    Apply 1 application topically at bedtime. To perineal area   OMEPRAZOLE (PRILOSEC) 20 MG CAPSULE    Take 20 mg by mouth daily.   POLYETHYLENE GLYCOL (MIRALAX / GLYCOLAX) PACKET    Take 17 g by  mouth daily.   PREDNISONE (DELTASONE) 10 MG TABLET    Take 3 tabs daily for 3 days, then 2 tabs daily for 3 days, then 1 tab daily for 3 days, then stop.   PREGABALIN (LYRICA) 75 MG CAPSULE    Take 75 mg by mouth at bedtime.   PROMETHAZINE (PHENERGAN) 25 MG TABLET    Take 25 mg by mouth every 4 (four) hours as needed for nausea.   SACCHAROMYCES BOULARDII (FLORASTOR) 250 MG CAPSULE    Take 250 mg by mouth 2 (two) times daily.   SENNA-DOCUSATE (SENOKOT-S) 8.6-50 MG PER TABLET    Take 2 tablets by mouth every other day.   VITAMIN B-12 (CYANOCOBALAMIN) 1000 MCG TABLET    Take 1,000 mcg by mouth every evening.  Modified Medications   No medications on file  Discontinued Medications   No medications on file     Physical Exam: Physical Exam  Constitutional: She is oriented to person, place, and time. She appears well-developed and well-nourished.  HENT:  Head: Normocephalic and atraumatic.  Eyes: Conjunctivae and EOM are normal. Pupils are equal, round, and reactive to light.  Neck: Normal range of motion. Neck supple. No JVD present. No thyromegaly present.  Cardiovascular: Normal rate, regular rhythm and normal heart sounds.   No murmur heard. Pulmonary/Chest: Effort normal. No respiratory distress. She has no wheezes. She has rales (bibasilar dry rales. ). She exhibits no tenderness.  Abdominal: Soft. Bowel sounds are normal. She exhibits no distension. There is no tenderness.  Musculoskeletal: Normal range of motion. She exhibits edema (ankle L>R). She exhibits no tenderness.  Lymphadenopathy:    She has no cervical adenopathy.  Neurological: She is alert and oriented to person, place, and time. She has normal reflexes. No cranial nerve deficit. She exhibits abnormal muscle tone (left sided is weaker than the right with muscle strength 5/5). Coordination abnormal.  Skin: Rash noted.  Surgical incision behind left era. Toes are red and itches  Psychiatric: She has a normal mood and affect.  Her behavior is normal. Judgment and thought content normal.     Filed Vitals:   03/06/13 1513  BP: 132/76  Pulse: 70  Temp: 96.9 F (36.1 C)  TempSrc: Tympanic  Resp: 16      Labs reviewed: Basic Metabolic Panel:  Recent Labs  16/10/96  11/22/12 0600 11/23/12 0547 11/24/12 0528 11/29/12 01/03/13  NA  --   < > 140 138 138 135*  --   K  --   < > 3.8 3.0* 3.5  --  4.1  CL  --   < > 101 100 101  --   --   CO2  --   < > 28 26 30   --   --   GLUCOSE  --   < > 149* 313* 190*  --   --   BUN  --   < >  9 14 18   --  18  CREATININE  --   < > 0.72 0.63 0.77  --  0.8  CALCIUM  --   < > 8.7 8.6 9.0  --   --   TSH 1.97  --   --   --   --   --   --   < > = values in this interval not displayed.  Liver Function Tests:  Recent Labs  11/23/12 0547 11/24/12 0528 11/25/12 0525  AST 24 16 14   ALT 93* 66* 52*  ALKPHOS 166* 134* 135*  BILITOT 0.2* 0.2* 0.2*  PROT 7.1 6.7 6.9  ALBUMIN 2.9* 2.8* 3.0*    CBC:  Recent Labs  11/18/12 2321  11/21/12 0551 11/22/12 0600 11/23/12 0547 01/03/13  WBC 12.1*  < > 7.0 6.9 7.3 10.7  NEUTROABS 11.4*  --  5.4  --   --   --   HGB 11.0*  < > 10.8* 10.9* 11.2* 11.4*  HCT 32.3*  < > 32.5* 32.8* 33.8* 33*  MCV 83.5  < > 84.0 84.1 82.8  --   PLT 194  < > 182 184 206 267  < > = values in this interval not displayed.  Anemia Panel: No results found for this basename: IRON, FOLATE, VITAMINB12,  in the last 8760 hours  Significant Diagnostic Results:     Assessment/Plan Edema Left ankle> the right, chronic, weight 187 Ibs--no significant weight change.       Dementia, in, senility Stable, continue Namenda      Major depressive disorder, single episode, unspecified Stable on Prozac 20mg             Family/ staff Communication: observe   Goals of care: SNF   Labs/tests ordered none

## 2013-04-12 ENCOUNTER — Encounter: Payer: Self-pay | Admitting: Nurse Practitioner

## 2013-04-12 ENCOUNTER — Non-Acute Institutional Stay (SKILLED_NURSING_FACILITY): Payer: Medicare Other | Admitting: Nurse Practitioner

## 2013-04-12 DIAGNOSIS — R609 Edema, unspecified: Secondary | ICD-10-CM

## 2013-04-12 DIAGNOSIS — E119 Type 2 diabetes mellitus without complications: Secondary | ICD-10-CM

## 2013-04-12 DIAGNOSIS — E039 Hypothyroidism, unspecified: Secondary | ICD-10-CM

## 2013-04-12 DIAGNOSIS — F329 Major depressive disorder, single episode, unspecified: Secondary | ICD-10-CM

## 2013-04-12 DIAGNOSIS — F0391 Unspecified dementia with behavioral disturbance: Secondary | ICD-10-CM

## 2013-04-12 DIAGNOSIS — IMO0002 Reserved for concepts with insufficient information to code with codable children: Secondary | ICD-10-CM

## 2013-04-12 DIAGNOSIS — K5900 Constipation, unspecified: Secondary | ICD-10-CM

## 2013-04-12 NOTE — Assessment & Plan Note (Signed)
- 

## 2013-04-12 NOTE — Assessment & Plan Note (Signed)
Takes Glipizide 5mg  and Metformin 500mg  bid. Hgb A1c 6.8 01/17/13

## 2013-04-12 NOTE — Assessment & Plan Note (Signed)
Stable, continue Namenda

## 2013-04-12 NOTE — Assessment & Plan Note (Signed)
Choking episode, diet modified to Nectar thick liquid and Purred diet. Takes Omeprazole 20mg daily.                                        

## 2013-04-12 NOTE — Assessment & Plan Note (Signed)
Left ankle> the right, chronic, weight 187 Ibs-182 Ibs-no significant weight change.

## 2013-04-12 NOTE — Assessment & Plan Note (Signed)
Pain is managed with Lyrica 75mg   

## 2013-04-12 NOTE — Assessment & Plan Note (Signed)
Stable, continue Miralax daily and Senokot S II qod  

## 2013-04-12 NOTE — Progress Notes (Signed)
Patient ID: Sonya Garrett, female   DOB: July 26, 1929, 79 y.o.   MRN: [id number] Code Status: DNR  Allergies  Allergen Reactions  . Codeine Phosphate     unknown  . Darvocet (Propoxyphene-Acetaminophen)     unknown  . Ibuprofen     unknown  . Prozac (Fluoxetine Hcl)     unknown  . Reglan (Metoclopramide)     unknown  . Tofranil (Imipramine Hcl)     unknown    Chief Complaint  Patient presents with  . Medical Managment of Chronic Issues    HPI: Patient is a 79 y.o. female seen in the SNF at [facility] today for evaluation of her chronic medical conditions.  Problem List Items Addressed This Visit   Dementia, in, senility     Stable, continue Namenda          Diabetes mellitus - Primary (Chronic)     Takes Glipizide 5mg  and Metformin 500mg  bid. Hgb A1c 6.8 01/17/13          Edema     Left ankle> the right, chronic, weight 187 Ibs-182 Ibs-no significant weight change.           Major depressive disorder, single episode, unspecified     Stable on Prozac 20mg            Neuralgia, neuritis, and radiculitis, unspecified     Pain is managed with Lyrica 75mg       Reflux esophagitis     Choking episode, diet modified to Nectar thick liquid and Purred diet. Takes Omeprazole 20mg  daily.         Unspecified constipation     Stable, continue Miralax daily and Senokot S II qod      Unspecified hypothyroidism     Takes Levothyroxine since 03/07/12, TSH 1.973 11/15/12             Review of Systems:  Review of Systems  Constitutional: Positive for malaise/fatigue. Negative for fever, chills, weight loss and diaphoresis.  HENT: Positive for hearing loss. Negative for ear pain, congestion, sore throat and neck pain.   Eyes: Negative for pain, discharge and redness.  Respiratory: Positive for cough. Negative for sputum production, shortness of breath and wheezing.   Cardiovascular: Positive for leg swelling (chronic in ankles,  L>R). Negative for chest pain, palpitations, orthopnea, claudication and PND.  Gastrointestinal: Negative for heartburn, nausea, vomiting, abdominal pain, diarrhea and constipation.  Genitourinary: Positive for frequency (chronic). Negative for dysuria, urgency and flank pain.  Musculoskeletal: Positive for myalgias, back pain and joint pain.  Skin: Negative for itching and rash.  Neurological: Positive for focal weakness (left sided weakness grip strength 4-5/5) and weakness (general and left sided. ). Negative for dizziness, tingling, tremors, speech change, seizures, loss of consciousness and headaches.  Endo/Heme/Allergies: Negative for environmental allergies and polydipsia. Does not bruise/bleed easily.  Psychiatric/Behavioral: Positive for memory loss. Negative for depression and hallucinations. The patient is not nervous/anxious and does not have insomnia.      Past Medical History  Diagnosis Date  . Thyroid disease hypotyroidism  . Diabetes mellitus without complication   . Anemia   . Depression   . Vitamin D deficiency   . Sleep apnea   . Hypertension   . Osteoarthritis   . GERD (gastroesophageal reflux disease)    Past Surgical History  Procedure Laterality Date  . Brain meningioma excision    . Cholecystectomy    . Tonsillectomy    . Tracheostomy  Social History:   reports that she has never smoked. She has never used smokeless tobacco. She reports that she does not drink alcohol or use illicit drugs.  Family History  Problem Relation Age of Onset  . Diabetes Mother     Medications: Reviewed at [facility]   Physical Exam: Physical Exam  Constitutional: She is oriented to person, place, and time. She appears well-developed and well-nourished.  HENT:  Head: Normocephalic and atraumatic.  Eyes: Conjunctivae and EOM are normal. Pupils are equal, round, and reactive to light.  Neck: Normal range of motion. Neck supple. No JVD present. No thyromegaly present.   Cardiovascular: Normal rate, regular rhythm and normal heart sounds.   No murmur heard. Pulmonary/Chest: Effort normal. No respiratory distress. She has no wheezes. She has rales (bibasilar dry rales. ). She exhibits no tenderness.  Abdominal: Soft. Bowel sounds are normal. She exhibits no distension. There is no tenderness.  Musculoskeletal: Normal range of motion. She exhibits edema (ankle L>R). She exhibits no tenderness.  Lymphadenopathy:    She has no cervical adenopathy.  Neurological: She is alert and oriented to person, place, and time. She has normal reflexes. No cranial nerve deficit. She exhibits abnormal muscle tone (left sided is weaker than the right with muscle strength 5/5). Coordination abnormal.  Skin: Rash noted.  Surgical incision behind left era. Toes are red and itches  Psychiatric: She has a normal mood and affect. Her behavior is normal. Judgment and thought content normal.    Filed Vitals:   04/12/13 1556  BP: 140/66  Pulse: 70  Temp: 98.2 F (36.8 C)  TempSrc: Tympanic  Resp: 16      Labs reviewed: Basic Metabolic Panel:  Recent Labs  78/46/96  11/22/12 0600 11/23/12 0547 11/24/12 0528 11/29/12 01/03/13  NA  --   < > 140 138 138 135*  --   K  --   < > 3.8 3.0* 3.5  --  4.1  CL  --   < > 101 100 101  --   --   CO2  --   < > 28 26 30   --   --   GLUCOSE  --   < > 149* 313* 190*  --   --   BUN  --   < > 9 14 18   --  18  CREATININE  --   < > 0.72 0.63 0.77  --  0.8  CALCIUM  --   < > 8.7 8.6 9.0  --   --   TSH 1.97  --   --   --   --   --   --   < > = values in this interval not displayed. Liver Function Tests:  Recent Labs  11/23/12 0547 11/24/12 0528 11/25/12 0525  AST 24 16 14   ALT 93* 66* 52*  ALKPHOS 166* 134* 135*  BILITOT 0.2* 0.2* 0.2*  PROT 7.1 6.7 6.9  ALBUMIN 2.9* 2.8* 3.0*    CBC:  Recent Labs  11/18/12 2321  11/21/12 0551 11/22/12 0600 11/23/12 0547 01/03/13  WBC 12.1*  < > 7.0 6.9 7.3 10.7  NEUTROABS 11.4*  --   5.4  --   --   --   HGB 11.0*  < > 10.8* 10.9* 11.2* 11.4*  HCT 32.3*  < > 32.5* 32.8* 33.8* 33*  MCV 83.5  < > 84.0 84.1 82.8  --   PLT 194  < > 182 184 206 267  < > = values in  this interval not displayed.     Assessment/Plan Diabetes mellitus Takes Glipizide 5mg  and Metformin 500mg  bid. Hgb A1c 6.8 01/17/13        Unspecified constipation Stable, continue Miralax daily and Senokot S II qod    Reflux esophagitis Choking episode, diet modified to Nectar thick liquid and Purred diet. Takes Omeprazole 20mg  daily.       Unspecified hypothyroidism Takes Levothyroxine since 03/07/12, TSH 1.973 11/15/12        Edema Left ankle> the right, chronic, weight 187 Ibs-182 Ibs-no significant weight change.         Major depressive disorder, single episode, unspecified Stable on Prozac 20mg          Dementia, in, senility Stable, continue Namenda        Neuralgia, neuritis, and radiculitis, unspecified Pain is managed with Lyrica 75mg       Family/ Staff Communication: observe the patient.   Goals of Care: SNF  Labs/tests ordered: none

## 2013-04-12 NOTE — Assessment & Plan Note (Signed)
Takes Levothyroxine since 03/07/12, TSH 1.973 11/15/12

## 2013-04-19 ENCOUNTER — Non-Acute Institutional Stay (SKILLED_NURSING_FACILITY): Payer: Medicare Other | Admitting: Nurse Practitioner

## 2013-04-19 ENCOUNTER — Encounter: Payer: Self-pay | Admitting: Nurse Practitioner

## 2013-04-19 DIAGNOSIS — F329 Major depressive disorder, single episode, unspecified: Secondary | ICD-10-CM

## 2013-04-19 DIAGNOSIS — K2100 Gastro-esophageal reflux disease with esophagitis, without bleeding: Secondary | ICD-10-CM

## 2013-04-19 DIAGNOSIS — E039 Hypothyroidism, unspecified: Secondary | ICD-10-CM

## 2013-04-19 DIAGNOSIS — E119 Type 2 diabetes mellitus without complications: Secondary | ICD-10-CM

## 2013-04-19 DIAGNOSIS — F03918 Unspecified dementia, unspecified severity, with other behavioral disturbance: Secondary | ICD-10-CM

## 2013-04-19 DIAGNOSIS — F0391 Unspecified dementia with behavioral disturbance: Secondary | ICD-10-CM

## 2013-04-19 DIAGNOSIS — K5900 Constipation, unspecified: Secondary | ICD-10-CM

## 2013-04-19 NOTE — Assessment & Plan Note (Signed)
- 

## 2013-04-19 NOTE — Assessment & Plan Note (Signed)
More confused, UA pending, will check CBC and CMP, update MMSE, continue Namenda

## 2013-04-19 NOTE — Assessment & Plan Note (Signed)
Choking episode, diet modified to Nectar thick liquid and Purred diet. Takes Omeprazole 20mg  daily.

## 2013-04-19 NOTE — Assessment & Plan Note (Signed)
Takes Levothyroxine since 03/07/12, TSH 1.973 11/15/12, update TSH

## 2013-04-19 NOTE — Progress Notes (Signed)
Patient ID: Sonya Garrett, female   DOB: May 17, 1929, 79 y.o.   MRN: [id number] Code Status: DNR  Allergies  Allergen Reactions  . Codeine Phosphate     unknown  . Darvocet (Propoxyphene-Acetaminophen)     unknown  . Ibuprofen     unknown  . Prozac (Fluoxetine Hcl)     unknown  . Reglan (Metoclopramide)     unknown  . Tofranil (Imipramine Hcl)     unknown    Chief Complaint  Patient presents with  . Medical Managment of Chronic Issues    HPI: Patient is a 79 y.o. female seen in the SNF at [facility] today for evaluation of dementia with increased confusion and other chronic medical conditions.  Problem List Items Addressed This Visit   Dementia, in, senility     More confused, UA pending, will check CBC and CMP, update MMSE, continue Namenda            Diabetes mellitus - Primary (Chronic)     Takes Glipizide 5mg  and Metformin 500mg  bid. Hgb A1c 6.8 01/17/13            Major depressive disorder, single episode, unspecified     Stable on Prozac 20mg            Reflux esophagitis     Choking episode, diet modified to Nectar thick liquid and Purred diet. Takes Omeprazole 20mg  daily.           Unspecified constipation     Stable, continue Miralax daily and Senokot S II qod        Unspecified hypothyroidism     Takes Levothyroxine since 03/07/12, TSH 1.973 11/15/12, update TSH               Review of Systems:  Review of Systems  Constitutional: Positive for malaise/fatigue. Negative for fever, chills, weight loss and diaphoresis.  HENT: Positive for hearing loss. Negative for ear pain, congestion, sore throat and neck pain.   Eyes: Negative for pain, discharge and redness.  Respiratory: Positive for cough. Negative for sputum production, shortness of breath and wheezing.   Cardiovascular: Positive for leg swelling (chronic in ankles, L>R). Negative for chest pain, palpitations, orthopnea, claudication and PND.   Gastrointestinal: Negative for heartburn, nausea, vomiting, abdominal pain, diarrhea and constipation.  Genitourinary: Positive for frequency (chronic). Negative for dysuria, urgency and flank pain.  Musculoskeletal: Positive for myalgias, back pain and joint pain.  Skin: Negative for itching and rash.  Neurological: Positive for focal weakness (left sided weakness grip strength 4-5/5) and weakness (general and left sided. ). Negative for dizziness, tingling, tremors, speech change, seizures, loss of consciousness and headaches.  Endo/Heme/Allergies: Negative for environmental allergies and polydipsia. Does not bruise/bleed easily.  Psychiatric/Behavioral: Positive for memory loss. Negative for depression and hallucinations. The patient is not nervous/anxious and does not have insomnia.      Past Medical History  Diagnosis Date  . Thyroid disease hypotyroidism  . Diabetes mellitus without complication   . Anemia   . Depression   . Vitamin D deficiency   . Sleep apnea   . Hypertension   . Osteoarthritis   . GERD (gastroesophageal reflux disease)    Past Surgical History  Procedure Laterality Date  . Brain meningioma excision    . Cholecystectomy    . Tonsillectomy    . Tracheostomy     Social History:   reports that she has never smoked. She has never used smokeless tobacco. She reports that  she does not drink alcohol or use illicit drugs.  Family History  Problem Relation Age of Onset  . Diabetes Mother     Medications: Patient's Medications  New Prescriptions   No medications on file  Previous Medications   ACETAMINOPHEN (TYLENOL) 325 MG TABLET    Take 650 mg by mouth every 6 (six) hours as needed for pain.   ALBUTEROL (PROVENTIL) (5 MG/ML) 0.5% NEBULIZER SOLUTION    Take 0.5 mLs (2.5 mg total) by nebulization every 2 (two) hours as needed for wheezing or shortness of breath.   ALBUTEROL (PROVENTIL) (5 MG/ML) 0.5% NEBULIZER SOLUTION    Take 0.5 mLs (2.5 mg total) by  nebulization every 6 (six) hours.   CALCIUM CARBONATE (TUMS - DOSED IN MG ELEMENTAL CALCIUM) 500 MG CHEWABLE TABLET    Chew 1 tablet by mouth 2 (two) times daily as needed for heartburn.   CHOLECALCIFEROL (VITAMIN D-3) 1000 UNITS CAPS    Take 1,000 Units by mouth daily.   FLUOXETINE (PROZAC) 20 MG/5ML SOLUTION    Take 20 mg by mouth every evening.   GLIPIZIDE (GLUCOTROL) 5 MG TABLET    Take 5 mg by mouth 2 (two) times daily before a meal.   GUAIFENESIN-DEXTROMETHORPHAN (ROBITUSSIN DM) 100-10 MG/5ML SYRUP    Take 5 mLs by mouth every 4 (four) hours as needed for cough.   HYDROXYPROPYL METHYLCELLULOSE (ISOPTO TEARS) 2.5 % OPHTHALMIC SOLUTION    Place 1 drop into both eyes 2 (two) times daily.   IPRATROPIUM (ATROVENT) 0.02 % NEBULIZER SOLUTION    Take 2.5 mLs (0.5 mg total) by nebulization every 6 (six) hours.   LEVOFLOXACIN (LEVAQUIN) 500 MG TABLET    Take 1 tablet (500 mg total) by mouth daily.   LEVOTHYROXINE (SYNTHROID, LEVOTHROID) 50 MCG TABLET    Take 50 mcg by mouth daily.   MEMANTINE (NAMENDA) 10 MG TABLET    Take 10 mg by mouth 2 (two) times daily.   METFORMIN (GLUCOPHAGE) 500 MG TABLET    Take 500 mg by mouth 2 (two) times daily with a meal.   NYSTATIN (MYCOSTATIN/NYSTOP) 100000 UNIT/GM POWD    Apply 1 g topically 2 (two) times daily as needed (applied to skin folds twice daily as needed for rash).   NYSTATIN-TRIAMCINOLONE (MYCOLOG II) CREAM    Apply 1 application topically at bedtime. To perineal area   OMEPRAZOLE (PRILOSEC) 20 MG CAPSULE    Take 20 mg by mouth daily.   POLYETHYLENE GLYCOL (MIRALAX / GLYCOLAX) PACKET    Take 17 g by mouth daily.   PREDNISONE (DELTASONE) 10 MG TABLET    Take 3 tabs daily for 3 days, then 2 tabs daily for 3 days, then 1 tab daily for 3 days, then stop.   PREGABALIN (LYRICA) 75 MG CAPSULE    Take 75 mg by mouth at bedtime.   PROMETHAZINE (PHENERGAN) 25 MG TABLET    Take 25 mg by mouth every 4 (four) hours as needed for nausea.   SACCHAROMYCES BOULARDII  (FLORASTOR) 250 MG CAPSULE    Take 250 mg by mouth 2 (two) times daily.   SENNA-DOCUSATE (SENOKOT-S) 8.6-50 MG PER TABLET    Take 2 tablets by mouth every other day.   VITAMIN B-12 (CYANOCOBALAMIN) 1000 MCG TABLET    Take 1,000 mcg by mouth every evening.  Modified Medications   No medications on file  Discontinued Medications   No medications on file     Physical Exam: Physical Exam  Constitutional: She is oriented to person,  place, and time. She appears well-developed and well-nourished.  HENT:  Head: Normocephalic and atraumatic.  Eyes: Conjunctivae and EOM are normal. Pupils are equal, round, and reactive to light.  Neck: Normal range of motion. Neck supple. No JVD present. No thyromegaly present.  Cardiovascular: Normal rate, regular rhythm and normal heart sounds.   No murmur heard. Pulmonary/Chest: Effort normal. No respiratory distress. She has no wheezes. She has rales (bibasilar dry rales. ). She exhibits no tenderness.  Abdominal: Soft. Bowel sounds are normal. She exhibits no distension. There is no tenderness.  Musculoskeletal: Normal range of motion. She exhibits edema (ankle L>R). She exhibits no tenderness.  Lymphadenopathy:    She has no cervical adenopathy.  Neurological: She is alert and oriented to person, place, and time. She has normal reflexes. No cranial nerve deficit. She exhibits abnormal muscle tone (left sided is weaker than the right with muscle strength 5/5). Coordination abnormal.  Skin: Rash noted.  Surgical incision behind left era. Toes are red and itches  Psychiatric: Thought content normal. Her mood appears not anxious. Her affect is angry and inappropriate. Her affect is not blunt and not labile. She is agitated. She is not aggressive, not hyperactive, not slowed, not withdrawn, not actively hallucinating and not combative. Thought content is not paranoid and not delusional. Cognition and memory are impaired. She expresses impulsivity and inappropriate  judgment. She does not exhibit a depressed mood. She expresses no homicidal ideation. She expresses no homicidal plans. She exhibits abnormal recent memory. She exhibits normal remote memory. She is attentive.    Filed Vitals:   04/19/13 1505  BP: 140/66  Pulse: 70  Temp: 98.2 F (36.8 C)  TempSrc: Tympanic  Resp: 16      Labs reviewed: Basic Metabolic Panel:  Recent Labs  16/10/96  11/22/12 0600 11/23/12 0547 11/24/12 0528 11/29/12 01/03/13  NA  --   < > 140 138 138 135*  --   K  --   < > 3.8 3.0* 3.5  --  4.1  CL  --   < > 101 100 101  --   --   CO2  --   < > 28 26 30   --   --   GLUCOSE  --   < > 149* 313* 190*  --   --   BUN  --   < > 9 14 18   --  18  CREATININE  --   < > 0.72 0.63 0.77  --  0.8  CALCIUM  --   < > 8.7 8.6 9.0  --   --   TSH 1.97  --   --   --   --   --   --   < > = values in this interval not displayed. Liver Function Tests:  Recent Labs  11/23/12 0547 11/24/12 0528 11/25/12 0525  AST 24 16 14   ALT 93* 66* 52*  ALKPHOS 166* 134* 135*  BILITOT 0.2* 0.2* 0.2*  PROT 7.1 6.7 6.9  ALBUMIN 2.9* 2.8* 3.0*    CBC:  Recent Labs  11/18/12 2321  11/21/12 0551 11/22/12 0600 11/23/12 0547 01/03/13  WBC 12.1*  < > 7.0 6.9 7.3 10.7  NEUTROABS 11.4*  --  5.4  --   --   --   HGB 11.0*  < > 10.8* 10.9* 11.2* 11.4*  HCT 32.3*  < > 32.5* 32.8* 33.8* 33*  MCV 83.5  < > 84.0 84.1 82.8  --   PLT 194  < >  182 184 206 267  < > = values in this interval not displayed.      Assessment/Plan Diabetes mellitus Takes Glipizide 5mg  and Metformin 500mg  bid. Hgb A1c 6.8 01/17/13          Unspecified constipation Stable, continue Miralax daily and Senokot S II qod      Reflux esophagitis Choking episode, diet modified to Nectar thick liquid and Purred diet. Takes Omeprazole 20mg  daily.         Unspecified hypothyroidism Takes Levothyroxine since 03/07/12, TSH 1.973 11/15/12, update TSH          Major depressive disorder,  single episode, unspecified Stable on Prozac 20mg          Dementia, in, senility More confused, UA pending, will check CBC and CMP, update MMSE, continue Namenda            Family/ Staff Communication: observe the patient.   Goals of Care: SNF  Labs/tests ordered: MMSE, TSH, CBC, CMP

## 2013-04-19 NOTE — Assessment & Plan Note (Signed)
Stable, continue Miralax daily and Senokot S II qod  

## 2013-04-19 NOTE — Assessment & Plan Note (Signed)
Takes Glipizide 5mg and Metformin 500mg bid. Hgb A1c 6.8 01/17/13                 

## 2013-04-20 LAB — BASIC METABOLIC PANEL
BUN: 10 mg/dL (ref 4–21)
Creatinine: 0.9 mg/dL (ref 0.5–1.1)
Glucose: 98 mg/dL
Sodium: 139 mmol/L (ref 137–147)

## 2013-04-20 LAB — CBC AND DIFFERENTIAL: HCT: 32 % — AB (ref 36–46)

## 2013-04-20 LAB — HEPATIC FUNCTION PANEL: Bilirubin, Total: 0.2 mg/dL

## 2013-04-20 LAB — TSH: TSH: 2.22 u[IU]/mL (ref 0.41–5.90)

## 2013-05-01 ENCOUNTER — Encounter: Payer: Self-pay | Admitting: Nurse Practitioner

## 2013-05-01 ENCOUNTER — Non-Acute Institutional Stay (SKILLED_NURSING_FACILITY): Payer: Medicare Other | Admitting: Nurse Practitioner

## 2013-05-01 DIAGNOSIS — F0391 Unspecified dementia with behavioral disturbance: Secondary | ICD-10-CM

## 2013-05-01 DIAGNOSIS — E039 Hypothyroidism, unspecified: Secondary | ICD-10-CM

## 2013-05-01 DIAGNOSIS — K5900 Constipation, unspecified: Secondary | ICD-10-CM

## 2013-05-01 DIAGNOSIS — IMO0002 Reserved for concepts with insufficient information to code with codable children: Secondary | ICD-10-CM

## 2013-05-01 DIAGNOSIS — F329 Major depressive disorder, single episode, unspecified: Secondary | ICD-10-CM

## 2013-05-01 DIAGNOSIS — R609 Edema, unspecified: Secondary | ICD-10-CM

## 2013-05-01 DIAGNOSIS — E119 Type 2 diabetes mellitus without complications: Secondary | ICD-10-CM

## 2013-05-01 NOTE — Assessment & Plan Note (Signed)
Pain is managed with Lyrica 75mg   

## 2013-05-01 NOTE — Assessment & Plan Note (Signed)
Takes Glipizide 5mg and Metformin 500mg bid. Hgb A1c 6.8 01/17/13                 

## 2013-05-01 NOTE — Assessment & Plan Note (Signed)
Left ankle> the right, chronic, weight 187 Ibs-182 Ibs-no significant weight change.            

## 2013-05-01 NOTE — Assessment & Plan Note (Signed)
More anxious and agitated in evenings, depression associated with dementia, will increase Prozac to 40mg  and starts prn Ativan 0.5mg  q8hr prn-observe the patient.

## 2013-05-01 NOTE — Assessment & Plan Note (Signed)
Takes Levothyroxine since 03/07/12, TSH 1.973 11/15/12, updated TSH 2.222 04/20/13

## 2013-05-01 NOTE — Assessment & Plan Note (Signed)
More confused in pm, anxious/agitation/repetitive questioning, UA 03/01/13 viridans 80,000c/ml treated with Augmentin--didn't change her behaviors, CBC and CMP unremarkable, updated MMSE 25/30 05/01/13, will continue Namenda, add prn Ativan 0.5mg  q8hr, and better manage mood.

## 2013-05-01 NOTE — Assessment & Plan Note (Signed)
Stable, continue Miralax daily and Senokot S II qod  

## 2013-05-01 NOTE — Progress Notes (Signed)
Patient ID: Sonya Garrett, female   DOB: [birth year], 79 y.o.   MRN: [id number] Code Status: DNR  Allergies  Allergen Reactions  . Codeine Phosphate     unknown  . Darvocet (Propoxyphene-Acetaminophen)     unknown  . Ibuprofen     unknown  . Prozac (Fluoxetine Hcl)     unknown  . Reglan (Metoclopramide)     unknown  . Tofranil (Imipramine Hcl)     unknown    Chief Complaint  Patient presents with  . Medical Managment of Chronic Issues    agitation and anxiety in evenings    HPI: Patient is a 79 y.o. female seen in the SNF at [facility] today for evaluation of depression, dementia, and other chronic medical conditions.  Problem List Items Addressed This Visit   Dementia, in, senility - Primary     More confused in pm, anxious/agitation/repetitive questioning, UA 03/01/13 viridans 80,000c/ml treated with Augmentin--didn't change her behaviors, CBC and CMP unremarkable, updated MMSE 25/30 05/01/13, will continue Namenda, add prn Ativan 0.5mg  q8hr, and better manage mood.               Diabetes mellitus (Chronic)     Takes Glipizide 5mg  and Metformin 500mg  bid. Hgb A1c 6.8 01/17/13              Edema     Left ankle> the right, chronic, weight 187 Ibs-182 Ibs-no significant weight change.             Major depressive disorder, single episode, unspecified     More anxious and agitated in evenings, depression associated with dementia, will increase Prozac to 40mg  and starts prn Ativan 0.5mg  q8hr prn-observe the patient.             Neuralgia, neuritis, and radiculitis, unspecified     Pain is managed with Lyrica 75mg         Unspecified constipation     Stable, continue Miralax daily and Senokot S II qod          Unspecified hypothyroidism     Takes Levothyroxine since 03/07/12, TSH 1.973 11/15/12, updated TSH 2.222 04/20/13                 Review of Systems:  Review of Systems  Constitutional: Negative for  fever, chills, weight loss, malaise/fatigue and diaphoresis.  HENT: Positive for hearing loss. Negative for ear pain, congestion, sore throat and neck pain.   Eyes: Negative for pain, discharge and redness.  Respiratory: Positive for cough. Negative for sputum production, shortness of breath and wheezing.   Cardiovascular: Positive for leg swelling (chronic in ankles, L>R). Negative for chest pain, palpitations, orthopnea, claudication and PND.  Gastrointestinal: Negative for heartburn, nausea, vomiting, abdominal pain, diarrhea and constipation.  Genitourinary: Positive for frequency (chronic). Negative for dysuria, urgency and flank pain.  Musculoskeletal: Positive for myalgias, back pain and joint pain.  Skin: Negative for itching and rash.  Neurological: Positive for focal weakness (left sided weakness grip strength 4-5/5) and weakness (general and left sided. ). Negative for dizziness, tingling, tremors, speech change, seizures, loss of consciousness and headaches.  Endo/Heme/Allergies: Negative for environmental allergies and polydipsia. Does not bruise/bleed easily.  Psychiatric/Behavioral: Positive for depression and memory loss. Negative for hallucinations. The patient is nervous/anxious. The patient does not have insomnia.      Past Medical History  Diagnosis Date  . Thyroid disease hypotyroidism  . Diabetes mellitus without complication   . Anemia   .  Depression   . Vitamin D deficiency   . Sleep apnea   . Hypertension   . Osteoarthritis   . GERD (gastroesophageal reflux disease)    Past Surgical History  Procedure Laterality Date  . Brain meningioma excision    . Cholecystectomy    . Tonsillectomy    . Tracheostomy     Social History:   reports that she has never smoked. She has never used smokeless tobacco. She reports that she does not drink alcohol or use illicit drugs.  Family History  Problem Relation Age of Onset  . Diabetes Mother      Medications: Reviewed at [facility]   Physical Exam: Physical Exam  Constitutional: She is oriented to person, place, and time. She appears well-developed and well-nourished.  HENT:  Head: Normocephalic and atraumatic.  Eyes: Conjunctivae and EOM are normal. Pupils are equal, round, and reactive to light.  Neck: Normal range of motion. Neck supple. No JVD present. No thyromegaly present.  Cardiovascular: Normal rate, regular rhythm and normal heart sounds.   No murmur heard. Pulmonary/Chest: Effort normal. No respiratory distress. She has no wheezes. She has rales (bibasilar dry rales. ). She exhibits no tenderness.  Abdominal: Soft. Bowel sounds are normal. She exhibits no distension. There is no tenderness.  Musculoskeletal: Normal range of motion. She exhibits edema (ankle L>R). She exhibits no tenderness.  Lymphadenopathy:    She has no cervical adenopathy.  Neurological: She is alert and oriented to person, place, and time. She has normal reflexes. No cranial nerve deficit. She exhibits abnormal muscle tone (left sided is weaker than the right with muscle strength 5/5). Coordination abnormal.  Skin: Rash noted.  Surgical incision behind left era. Toes are red and itches  Psychiatric: Thought content normal. Her mood appears anxious. Her affect is angry and inappropriate. Her affect is not blunt and not labile. She is agitated. She is not aggressive, not hyperactive, not slowed, not withdrawn, not actively hallucinating and not combative. Thought content is not paranoid and not delusional. Cognition and memory are impaired. She expresses impulsivity and inappropriate judgment. She does not exhibit a depressed mood. She expresses no homicidal ideation. She expresses no homicidal plans. She exhibits abnormal recent memory. She exhibits normal remote memory. She is attentive.    Filed Vitals:   05/01/13 1541  BP: 140/66  Pulse: 70  Temp: 98.2 F (36.8 C)  TempSrc: Tympanic  Resp: 16       Labs reviewed: Basic Metabolic Panel:  Recent Labs  16/10/96  11/22/12 0600 11/23/12 0547 11/24/12 0528 11/29/12 01/03/13 04/20/13  NA  --   < > 140 138 138 135*  --  139  K  --   < > 3.8 3.0* 3.5  --  4.1 4.1  CL  --   < > 101 100 101  --   --   --   CO2  --   < > 28 26 30   --   --   --   GLUCOSE  --   < > 149* 313* 190*  --   --   --   BUN  --   < > 9 14 18   --  18 10  CREATININE  --   < > 0.72 0.63 0.77  --  0.8 0.9  CALCIUM  --   < > 8.7 8.6 9.0  --   --   --   TSH 1.97  --   --   --   --   --   --  2.22  < > = values in this interval not displayed. Liver Function Tests:  Recent Labs  11/23/12 0547 11/24/12 0528 11/25/12 0525 04/20/13  AST 24 16 14  11*  ALT 93* 66* 52* 8  ALKPHOS 166* 134* 135* 69  BILITOT 0.2* 0.2* 0.2*  --   PROT 7.1 6.7 6.9  --   ALBUMIN 2.9* 2.8* 3.0*  --     CBC:  Recent Labs  11/18/12 2321  11/21/12 0551 11/22/12 0600 11/23/12 0547 01/03/13 04/20/13  WBC 12.1*  < > 7.0 6.9 7.3 10.7 10.1  NEUTROABS 11.4*  --  5.4  --   --   --   --   HGB 11.0*  < > 10.8* 10.9* 11.2* 11.4* 11.3*  HCT 32.3*  < > 32.5* 32.8* 33.8* 33* 32*  MCV 83.5  < > 84.0 84.1 82.8  --   --   PLT 194  < > 182 184 206 267 208  < > = values in this interval not displayed.      Assessment/Plan Dementia, in, senility More confused in pm, anxious/agitation/repetitive questioning, UA 03/01/13 viridans 80,000c/ml treated with Augmentin--didn't change her behaviors, CBC and CMP unremarkable, updated MMSE 25/30 05/01/13, will continue Namenda, add prn Ativan 0.5mg  q8hr, and better manage mood.             Edema Left ankle> the right, chronic, weight 187 Ibs-182 Ibs-no significant weight change.           Major depressive disorder, single episode, unspecified More anxious and agitated in evenings, depression associated with dementia, will increase Prozac to 40mg  and starts prn Ativan 0.5mg  q8hr prn-observe the patient.           Diabetes  mellitus Takes Glipizide 5mg  and Metformin 500mg  bid. Hgb A1c 6.8 01/17/13            Unspecified hypothyroidism Takes Levothyroxine since 03/07/12, TSH 1.973 11/15/12, updated TSH 2.222 04/20/13            Unspecified constipation Stable, continue Miralax daily and Senokot S II qod        Neuralgia, neuritis, and radiculitis, unspecified Pain is managed with Lyrica 75mg         Family/ Staff Communication: observe the patient.   Goals of Care: SNF  Labs/tests ordered: none

## 2013-05-22 ENCOUNTER — Non-Acute Institutional Stay (SKILLED_NURSING_FACILITY): Payer: Medicare Other | Admitting: Nurse Practitioner

## 2013-05-22 ENCOUNTER — Encounter: Payer: Self-pay | Admitting: Nurse Practitioner

## 2013-05-22 DIAGNOSIS — E119 Type 2 diabetes mellitus without complications: Secondary | ICD-10-CM

## 2013-05-22 DIAGNOSIS — B3749 Other urogenital candidiasis: Secondary | ICD-10-CM

## 2013-05-22 DIAGNOSIS — IMO0002 Reserved for concepts with insufficient information to code with codable children: Secondary | ICD-10-CM

## 2013-05-22 DIAGNOSIS — K5900 Constipation, unspecified: Secondary | ICD-10-CM

## 2013-05-22 DIAGNOSIS — E039 Hypothyroidism, unspecified: Secondary | ICD-10-CM

## 2013-05-22 DIAGNOSIS — F329 Major depressive disorder, single episode, unspecified: Secondary | ICD-10-CM

## 2013-05-22 DIAGNOSIS — F0390 Unspecified dementia without behavioral disturbance: Secondary | ICD-10-CM

## 2013-05-22 NOTE — Assessment & Plan Note (Signed)
Takes Glipizide 5mg and Metformin 500mg bid. Hgb A1c 6.8 01/17/13                 

## 2013-05-22 NOTE — Assessment & Plan Note (Signed)
Better with Prozac 40mg  since 05/01/13

## 2013-05-22 NOTE — Assessment & Plan Note (Signed)
Mycolog II cream nightly to perineal area for 2 weeks.

## 2013-05-22 NOTE — Assessment & Plan Note (Signed)
Pain is managed with Lyrica 75mg   

## 2013-05-22 NOTE — Progress Notes (Signed)
Patient ID: Sonya Garrett, female   DOB: 1929-02-13, 79 y.o.   MRN: [id number] Code Status: DNR  Allergies  Allergen Reactions  . Codeine Phosphate     unknown  . Darvocet [Propoxyphene-Acetaminophen]     unknown  . Ibuprofen     unknown  . Prozac [Fluoxetine Hcl]     unknown  . Reglan [Metoclopramide]     unknown  . Tofranil [Imipramine Hcl]     unknown    Chief Complaint  Patient presents with  . Medical Managment of Chronic Issues    perineal itching     HPI: Patient is a 79 y.o. female seen in the SNF at [facility] today for evaluation of  Perineal itching/rash and other chronic medical conditions.  Problem List Items Addressed This Visit   Candidiasis of perineum - Primary     Mycolog II cream nightly to perineal area for 2 weeks.     Dementia, in, senility     More confused in pm, anxious/agitation/repetitive questioning, UA 03/01/13 viridans 80,000c/ml treated with Augmentin--didn't change her behaviors, CBC and CMP unremarkable, updated MMSE 25/30 05/01/13, will continue Namenda28mg  , add prn Ativan 0.5mg  q8hr, and better manage mood.                 Diabetes mellitus (Chronic)     Takes Glipizide 5mg  and Metformin 500mg  bid. Hgb A1c 6.8 01/17/13                Major depressive disorder, single episode, unspecified     Better with Prozac 40mg  since 05/01/13            Neuralgia, neuritis, and radiculitis, unspecified     Pain is managed with Lyrica 75mg           Reflux esophagitis     Choking episode, diet modified to Nectar thick liquid and Purred diet. Takes Omeprazole 20mg  daily.             Unspecified constipation     Stable, continue Miralax daily and Senokot S II qod            Unspecified hypothyroidism     Takes Levothyroxine since 03/07/12, TSH 1.973 11/15/12, updated TSH 2.222 04/20/13                   Review of Systems:  Review of Systems  Constitutional: Negative  for fever, chills, weight loss, malaise/fatigue and diaphoresis.  HENT: Positive for hearing loss. Negative for ear pain, congestion, sore throat and neck pain.   Eyes: Negative for pain, discharge and redness.  Respiratory: Positive for cough. Negative for sputum production, shortness of breath and wheezing.   Cardiovascular: Positive for leg swelling (chronic in ankles, L>R). Negative for chest pain, palpitations, orthopnea, claudication and PND.  Gastrointestinal: Negative for heartburn, nausea, vomiting, abdominal pain, diarrhea and constipation.  Genitourinary: Positive for frequency (chronic). Negative for dysuria, urgency and flank pain.  Musculoskeletal: Positive for myalgias, back pain and joint pain.  Skin: Positive for itching (perineal area). Negative for rash.  Neurological: Positive for focal weakness (left sided weakness grip strength 4-5/5) and weakness (general and left sided. ). Negative for dizziness, tingling, tremors, speech change, seizures, loss of consciousness and headaches.  Endo/Heme/Allergies: Negative for environmental allergies and polydipsia. Does not bruise/bleed easily.  Psychiatric/Behavioral: Positive for depression and memory loss. Negative for hallucinations. The patient is nervous/anxious. The patient does not have insomnia.      Past Medical History  Diagnosis  Date  . Thyroid disease hypotyroidism  . Diabetes mellitus without complication   . Anemia   . Depression   . Vitamin D deficiency   . Sleep apnea   . Hypertension   . Osteoarthritis   . GERD (gastroesophageal reflux disease)    Past Surgical History  Procedure Laterality Date  . Brain meningioma excision    . Cholecystectomy    . Tonsillectomy    . Tracheostomy     Social History:   reports that she has never smoked. She has never used smokeless tobacco. She reports that she does not drink alcohol or use illicit drugs.  Family History  Problem Relation Age of Onset  . Diabetes  Mother     Medications: Reviewed at [facility]   Physical Exam: Physical Exam  Constitutional: She is oriented to person, place, and time. She appears well-developed and well-nourished.  HENT:  Head: Normocephalic and atraumatic.  Eyes: Conjunctivae and EOM are normal. Pupils are equal, round, and reactive to light.  Neck: Normal range of motion. Neck supple. No JVD present. No thyromegaly present.  Cardiovascular: Normal rate, regular rhythm and normal heart sounds.   No murmur heard. Pulmonary/Chest: Effort normal. No respiratory distress. She has no wheezes. She has rales (bibasilar dry rales. ). She exhibits no tenderness.  Abdominal: Soft. Bowel sounds are normal. She exhibits no distension. There is no tenderness.  Musculoskeletal: Normal range of motion. She exhibits edema (ankle L>R). She exhibits no tenderness.  Lymphadenopathy:    She has no cervical adenopathy.  Neurological: She is alert and oriented to person, place, and time. She has normal reflexes. No cranial nerve deficit. She exhibits abnormal muscle tone (left sided is weaker than the right with muscle strength 5/5). Coordination abnormal.  Skin: Rash noted.  Surgical incision behind left era. Perineal area itching and redness  Psychiatric: Thought content normal. Her mood appears anxious. Her affect is angry and inappropriate. Her affect is not blunt and not labile. She is agitated. She is not aggressive, not hyperactive, not slowed, not withdrawn, not actively hallucinating and not combative. Thought content is not paranoid and not delusional. Cognition and memory are impaired. She expresses impulsivity and inappropriate judgment. She does not exhibit a depressed mood. She expresses no homicidal ideation. She expresses no homicidal plans. She exhibits abnormal recent memory. She exhibits normal remote memory. She is attentive.    Filed Vitals:   05/22/13 1749  BP: 140/60  Pulse: 70  Temp: 98.2 F (36.8 C)  TempSrc:  Tympanic  Resp: 16      Labs reviewed: Basic Metabolic Panel:  Recent Labs  16/10/96  11/22/12 0600 11/23/12 0547 11/24/12 0528 11/29/12 01/03/13 04/20/13  NA  --   < > 140 138 138 135*  --  139  K  --   < > 3.8 3.0* 3.5  --  4.1 4.1  CL  --   < > 101 100 101  --   --   --   CO2  --   < > 28 26 30   --   --   --   GLUCOSE  --   < > 149* 313* 190*  --   --   --   BUN  --   < > 9 14 18   --  18 10  CREATININE  --   < > 0.72 0.63 0.77  --  0.8 0.9  CALCIUM  --   < > 8.7 8.6 9.0  --   --   --  TSH 1.97  --   --   --   --   --   --  2.22  < > = values in this interval not displayed. Liver Function Tests:  Recent Labs  11/23/12 0547 11/24/12 0528 11/25/12 0525 04/20/13  AST 24 16 14  11*  ALT 93* 66* 52* 8  ALKPHOS 166* 134* 135* 69  BILITOT 0.2* 0.2* 0.2*  --   PROT 7.1 6.7 6.9  --   ALBUMIN 2.9* 2.8* 3.0*  --    CBC:  Recent Labs  11/18/12 2321  11/21/12 0551 11/22/12 0600 11/23/12 0547 01/03/13 04/20/13  WBC 12.1*  < > 7.0 6.9 7.3 10.7 10.1  NEUTROABS 11.4*  --  5.4  --   --   --   --   HGB 11.0*  < > 10.8* 10.9* 11.2* 11.4* 11.3*  HCT 32.3*  < > 32.5* 32.8* 33.8* 33* 32*  MCV 83.5  < > 84.0 84.1 82.8  --   --   PLT 194  < > 182 184 206 267 208  < > = values in this interval not displayed.  Past Procedures: 12/12/12 CXR mild patchy left basilar atelectasis or pneumonitis.    Assessment/Plan Candidiasis of perineum Mycolog II cream nightly to perineal area for 2 weeks.   Dementia, in, senility More confused in pm, anxious/agitation/repetitive questioning, UA 03/01/13 viridans 80,000c/ml treated with Augmentin--didn't change her behaviors, CBC and CMP unremarkable, updated MMSE 25/30 05/01/13, will continue Namenda28mg  , add prn Ativan 0.5mg  q8hr, and better manage mood.               Diabetes mellitus Takes Glipizide 5mg  and Metformin 500mg  bid. Hgb A1c 6.8 01/17/13              Unspecified constipation Stable, continue Miralax daily  and Senokot S II qod          Reflux esophagitis Choking episode, diet modified to Nectar thick liquid and Purred diet. Takes Omeprazole 20mg  daily.           Unspecified hypothyroidism Takes Levothyroxine since 03/07/12, TSH 1.973 11/15/12, updated TSH 2.222 04/20/13              Major depressive disorder, single episode, unspecified Better with Prozac 40mg  since 05/01/13          Neuralgia, neuritis, and radiculitis, unspecified Pain is managed with Lyrica 75mg           Family/ Staff Communication: observe the patient  Goals of Care: SNF  Labs/tests ordered: none

## 2013-05-22 NOTE — Assessment & Plan Note (Signed)
Takes Levothyroxine 50mcg since 03/07/12, TSH 1.973 11/15/12, updated TSH 2.222 04/20/13           

## 2013-05-22 NOTE — Assessment & Plan Note (Signed)
Stable, continue Miralax daily and Senokot S II qod  

## 2013-05-22 NOTE — Assessment & Plan Note (Addendum)
More confused in pm, anxious/agitation/repetitive questioning, UA 03/01/13 viridans 80,000c/ml treated with Augmentin--didn't change her behaviors, CBC and CMP unremarkable, updated MMSE 25/30 05/01/13, will continue Namenda28mg  , add prn Ativan 0.5mg  q8hr, and better manage mood.

## 2013-05-22 NOTE — Assessment & Plan Note (Signed)
Choking episode, diet modified to Nectar thick liquid and Purred diet. Takes Omeprazole 20mg daily.                                        

## 2013-06-28 ENCOUNTER — Non-Acute Institutional Stay (SKILLED_NURSING_FACILITY): Payer: Medicare Other | Admitting: Nurse Practitioner

## 2013-06-28 ENCOUNTER — Encounter: Payer: Self-pay | Admitting: Nurse Practitioner

## 2013-06-28 DIAGNOSIS — F0390 Unspecified dementia without behavioral disturbance: Secondary | ICD-10-CM

## 2013-06-28 DIAGNOSIS — K2100 Gastro-esophageal reflux disease with esophagitis, without bleeding: Secondary | ICD-10-CM

## 2013-06-28 DIAGNOSIS — F329 Major depressive disorder, single episode, unspecified: Secondary | ICD-10-CM

## 2013-06-28 DIAGNOSIS — IMO0002 Reserved for concepts with insufficient information to code with codable children: Secondary | ICD-10-CM

## 2013-06-28 DIAGNOSIS — E119 Type 2 diabetes mellitus without complications: Secondary | ICD-10-CM

## 2013-06-28 DIAGNOSIS — K5900 Constipation, unspecified: Secondary | ICD-10-CM

## 2013-06-28 NOTE — Assessment & Plan Note (Signed)
Takes Glipizide 5mg and Metformin 500mg bid. Hgb A1c 6.8 01/17/13                 

## 2013-06-28 NOTE — Assessment & Plan Note (Signed)
Choking episode, diet modified to Nectar thick liquid and Purred diet. Takes Omeprazole 20mg  daily.           Takes Levothyroxine since 03/07/12, TSH 1.973 11/15/12, updated TSH 2.222 04/20/13              More confused in pm, anxious/agitation/repetitive questioning, UA 03/01/13 viridans 80,000c/ml treated with Augmentin--didn't change her behaviors, CBC and CMP unremarkable, updated MMSE 25/30 05/01/13, will continue Namenda28mg  , add prn Ativan 0.5mg  q8hr, and better manage mood.

## 2013-06-28 NOTE — Assessment & Plan Note (Signed)
Stabilized on Namenda and prn Ativan since her Prozac was increased in July 2014.  

## 2013-06-28 NOTE — Assessment & Plan Note (Signed)
Pain is managed with Lyrica 75mg   

## 2013-06-28 NOTE — Assessment & Plan Note (Signed)
Stable, continue Miralax daily and Senokot S II qod  

## 2013-06-28 NOTE — Assessment & Plan Note (Signed)
Better with Prozac 40mg  since 05/01/13 and prn Ativan

## 2013-06-28 NOTE — Progress Notes (Signed)
Patient ID: Sonya Garrett, female   DOB: [birth year], 79 y.o.   MRN: [id number] Code Status: DNR  Allergies  Allergen Reactions  . Codeine Phosphate     unknown  . Darvocet [Propoxyphene-Acetaminophen]     unknown  . Ibuprofen     unknown  . Prozac [Fluoxetine Hcl]     unknown  . Reglan [Metoclopramide]     unknown  . Tofranil [Imipramine Hcl]     unknown    Chief Complaint  Patient presents with  . Medical Managment of Chronic Issues    HPI: Patient is a 79 y.o. female seen in the SNF at [facility] today for evaluation of her chronic medical conditions.  Problem List Items Addressed This Visit   Dementia, in, senility     Stabilized on Namenda and prn Ativan since her Prozac was increased in July 2014.                   Diabetes mellitus (Chronic)     Takes Glipizide 5mg  and Metformin 500mg  bid. Hgb A1c 6.8 01/17/13                  Major depressive disorder, single episode, unspecified - Primary     Better with Prozac 40mg  since 05/01/13 and prn Ativan               Neuralgia, neuritis, and radiculitis, unspecified     Pain is managed with Lyrica 75mg             Reflux esophagitis     Choking episode, diet modified to Nectar thick liquid and Purred diet. Takes Omeprazole 20mg  daily.           Takes Levothyroxine since 03/07/12, TSH 1.973 11/15/12, updated TSH 2.222 04/20/13              More confused in pm, anxious/agitation/repetitive questioning, UA 03/01/13 viridans 80,000c/ml treated with Augmentin--didn't change her behaviors, CBC and CMP unremarkable, updated MMSE 25/30 05/01/13, will continue Namenda28mg  , add prn Ativan 0.5mg  q8hr, and better manage mood.                   Unspecified constipation     Stable, continue Miralax daily and Senokot S II qod                 Review of Systems:  Review of Systems  Constitutional: Negative for fever, chills, weight  loss, malaise/fatigue and diaphoresis.  HENT: Positive for hearing loss. Negative for ear pain, congestion, sore throat and neck pain.   Eyes: Negative for pain, discharge and redness.  Respiratory: Positive for cough. Negative for sputum production, shortness of breath and wheezing.   Cardiovascular: Positive for leg swelling (chronic in ankles, L>R). Negative for chest pain, palpitations, orthopnea, claudication and PND.  Gastrointestinal: Negative for heartburn, nausea, vomiting, abdominal pain, diarrhea and constipation.  Genitourinary: Positive for frequency (chronic). Negative for dysuria, urgency and flank pain.  Musculoskeletal: Positive for myalgias, back pain and joint pain.  Skin: Negative for itching and rash.  Neurological: Positive for focal weakness (left sided weakness grip strength 4-5/5). Negative for dizziness, tingling, tremors, speech change, seizures, loss of consciousness, weakness and headaches.  Endo/Heme/Allergies: Negative for environmental allergies and polydipsia. Does not bruise/bleed easily.  Psychiatric/Behavioral: Positive for depression and memory loss. Negative for hallucinations. The patient is nervous/anxious. The patient does not have insomnia.      Past Medical History  Diagnosis Date  . Thyroid  disease hypotyroidism  . Diabetes mellitus without complication   . Anemia   . Depression   . Vitamin D deficiency   . Sleep apnea   . Hypertension   . Osteoarthritis   . GERD (gastroesophageal reflux disease)    Past Surgical History  Procedure Laterality Date  . Brain meningioma excision    . Cholecystectomy    . Tonsillectomy    . Tracheostomy     Social History:   reports that she has never smoked. She has never used smokeless tobacco. She reports that she does not drink alcohol or use illicit drugs.  Family History  Problem Relation Age of Onset  . Diabetes Mother     Medications: Reviewed at [facility]   Physical Exam: Physical Exam   Constitutional: She is oriented to person, place, and time. She appears well-developed and well-nourished.  HENT:  Head: Normocephalic and atraumatic.  Eyes: Conjunctivae and EOM are normal. Pupils are equal, round, and reactive to light.  Neck: Normal range of motion. Neck supple. No JVD present. No thyromegaly present.  Cardiovascular: Normal rate, regular rhythm and normal heart sounds.   No murmur heard. Pulmonary/Chest: Effort normal. No respiratory distress. She has no wheezes. She has rales (bibasilar dry rales. ). She exhibits no tenderness.  Abdominal: Soft. Bowel sounds are normal. She exhibits no distension. There is no tenderness.  Musculoskeletal: Normal range of motion. She exhibits edema (ankle L>R). She exhibits no tenderness.  Lymphadenopathy:    She has no cervical adenopathy.  Neurological: She is alert and oriented to person, place, and time. She has normal reflexes. No cranial nerve deficit. She exhibits abnormal muscle tone (left sided is weaker than the right with muscle strength 5/5). Coordination abnormal.  Left sided weakness even if grip strength 5/5  Skin: No rash noted.  Surgical incision behind left era.  Psychiatric: Thought content normal. Her mood appears anxious. Her affect is angry and inappropriate. Her affect is not blunt and not labile. She is agitated. She is not aggressive, not hyperactive, not slowed, not withdrawn, not actively hallucinating and not combative. Thought content is not paranoid and not delusional. Cognition and memory are impaired. She expresses impulsivity and inappropriate judgment. She does not exhibit a depressed mood. She expresses no homicidal ideation. She expresses no homicidal plans. She exhibits abnormal recent memory. She exhibits normal remote memory. She is attentive.    Filed Vitals:   06/28/13 1250  BP: 128/66  Pulse: 70  Temp: 98.1 F (36.7 C)  TempSrc: Tympanic  Resp: 20      Labs reviewed: Basic Metabolic  Panel:  Recent Labs  11/15/12  11/22/12 0600 11/23/12 0547 11/24/12 0528 11/29/12 01/03/13 04/20/13  NA  --   < > 140 138 138 135*  --  139  K  --   < > 3.8 3.0* 3.5  --  4.1 4.1  CL  --   < > 101 100 101  --   --   --   CO2  --   < > 28 26 30   --   --   --   GLUCOSE  --   < > 149* 313* 190*  --   --   --   BUN  --   < > 9 14 18   --  18 10  CREATININE  --   < > 0.72 0.63 0.77  --  0.8 0.9  CALCIUM  --   < > 8.7 8.6 9.0  --   --   --  TSH 1.97  --   --   --   --   --   --  2.22  < > = values in this interval not displayed. Liver Function Tests:  Recent Labs  11/23/12 0547 11/24/12 0528 11/25/12 0525 04/20/13  AST 24 16 14  11*  ALT 93* 66* 52* 8  ALKPHOS 166* 134* 135* 69  BILITOT 0.2* 0.2* 0.2*  --   PROT 7.1 6.7 6.9  --   ALBUMIN 2.9* 2.8* 3.0*  --    CBC:  Recent Labs  11/18/12 2321  11/21/12 0551 11/22/12 0600 11/23/12 0547 01/03/13 04/20/13  WBC 12.1*  < > 7.0 6.9 7.3 10.7 10.1  NEUTROABS 11.4*  --  5.4  --   --   --   --   HGB 11.0*  < > 10.8* 10.9* 11.2* 11.4* 11.3*  HCT 32.3*  < > 32.5* 32.8* 33.8* 33* 32*  MCV 83.5  < > 84.0 84.1 82.8  --   --   PLT 194  < > 182 184 206 267 208  < > = values in this interval not displayed.  Past Procedures: 12/12/12 CXR mild patchy left basilar atelectasis or pneumonitis.    Assessment/Plan Major depressive disorder, single episode, unspecified Better with Prozac 40mg  since 05/01/13 and prn Ativan             Diabetes mellitus Takes Glipizide 5mg  and Metformin 500mg  bid. Hgb A1c 6.8 01/17/13                Unspecified constipation Stable, continue Miralax daily and Senokot S II qod            Reflux esophagitis Choking episode, diet modified to Nectar thick liquid and Purred diet. Takes Omeprazole 20mg  daily.           Takes Levothyroxine since 03/07/12, TSH 1.973 11/15/12, updated TSH 2.222 04/20/13              More confused in pm,  anxious/agitation/repetitive questioning, UA 03/01/13 viridans 80,000c/ml treated with Augmentin--didn't change her behaviors, CBC and CMP unremarkable, updated MMSE 25/30 05/01/13, will continue Namenda28mg  , add prn Ativan 0.5mg  q8hr, and better manage mood.                 Dementia, in, senility Stabilized on Namenda and prn Ativan since her Prozac was increased in July 2014.                 Neuralgia, neuritis, and radiculitis, unspecified Pain is managed with Lyrica 75mg             Family/ Staff Communication: observe the patient  Goals of Care: SNF  Labs/tests ordered: none

## 2013-07-13 ENCOUNTER — Encounter: Payer: Self-pay | Admitting: *Deleted

## 2013-07-24 ENCOUNTER — Encounter: Payer: Self-pay | Admitting: Nurse Practitioner

## 2013-07-24 ENCOUNTER — Non-Acute Institutional Stay (SKILLED_NURSING_FACILITY): Payer: Medicare Other | Admitting: Nurse Practitioner

## 2013-07-24 DIAGNOSIS — K5900 Constipation, unspecified: Secondary | ICD-10-CM

## 2013-07-24 DIAGNOSIS — E039 Hypothyroidism, unspecified: Secondary | ICD-10-CM

## 2013-07-24 DIAGNOSIS — F0390 Unspecified dementia without behavioral disturbance: Secondary | ICD-10-CM

## 2013-07-24 DIAGNOSIS — F329 Major depressive disorder, single episode, unspecified: Secondary | ICD-10-CM

## 2013-07-24 DIAGNOSIS — IMO0002 Reserved for concepts with insufficient information to code with codable children: Secondary | ICD-10-CM

## 2013-07-24 DIAGNOSIS — K2100 Gastro-esophageal reflux disease with esophagitis, without bleeding: Secondary | ICD-10-CM

## 2013-07-24 DIAGNOSIS — E119 Type 2 diabetes mellitus without complications: Secondary | ICD-10-CM

## 2013-07-24 NOTE — Assessment & Plan Note (Signed)
Takes Levothyroxine since 03/07/12, TSH 1.973 11/15/12, updated TSH 2.222 04/20/13

## 2013-07-24 NOTE — Progress Notes (Signed)
Patient ID: Sonya Garrett, female   DOB: [birth year], 79 y.o.   MRN: [id number]  Code Status: DNR  Allergies  Allergen Reactions  . Codeine Phosphate     unknown  . Darvocet [Propoxyphene-Acetaminophen]     unknown  . Ibuprofen     unknown  . Prozac [Fluoxetine Hcl]     unknown  . Reglan [Metoclopramide]     unknown  . Tofranil [Imipramine Hcl]     unknown    Chief Complaint  Patient presents with  . Medical Managment of Chronic Issues    HPI: Patient is a 79 y.o. female seen in the SNF at [facility] today for evaluation of her chronic medical conditions.  Problem List Items Addressed This Visit   Dementia, in, senility     Stabilized on Namenda and prn Ativan since her Prozac was increased in July 2014.                     Diabetes mellitus (Chronic)     Takes Glipizide 5mg  and Metformin 500mg  bid. Hgb A1c 6.8 01/17/13                    Major depressive disorder, single episode, unspecified - Primary     Better with Prozac 40mg  since 05/01/13 and prn Ativan used 3x 06/2013.                 Neuralgia, neuritis, and radiculitis, unspecified     Pain is managed with Lyrica 75mg               Reflux esophagitis     Choking episode, diet modified to Nectar thick liquid and Purred diet. Takes Omeprazole 20mg  daily.                                           Unspecified constipation     Stable, continue Miralax daily and Senokot S II qod                Unspecified hypothyroidism     Takes Levothyroxine since 03/07/12, TSH 1.973 11/15/12, updated TSH 2.222 04/20/13                     Review of Systems:  Review of Systems  Constitutional: Negative for fever, chills, weight loss, malaise/fatigue and diaphoresis.  HENT: Positive for hearing loss. Negative for congestion, ear pain and sore throat.   Eyes: Negative for pain, discharge and redness.   Respiratory: Positive for cough. Negative for sputum production, shortness of breath and wheezing.   Cardiovascular: Positive for leg swelling (chronic in ankles, L>R). Negative for chest pain, palpitations, orthopnea, claudication and PND.  Gastrointestinal: Negative for heartburn, nausea, vomiting, abdominal pain, diarrhea and constipation.  Genitourinary: Positive for frequency (chronic). Negative for dysuria, urgency and flank pain.  Musculoskeletal: Positive for back pain, joint pain and myalgias. Negative for neck pain.  Skin: Negative for itching and rash.  Neurological: Positive for focal weakness (left sided weakness grip strength 4-5/5). Negative for dizziness, tingling, tremors, speech change, seizures, loss of consciousness, weakness and headaches.  Endo/Heme/Allergies: Negative for environmental allergies and polydipsia. Does not bruise/bleed easily.  Psychiatric/Behavioral: Positive for depression and memory loss. Negative for hallucinations. The patient is nervous/anxious. The patient does not have insomnia.      Past Medical History  Diagnosis Date  .  Thyroid disease hypotyroidism  . Diabetes mellitus without complication   . Anemia   . Depression   . Vitamin D deficiency   . Sleep apnea   . Hypertension   . Osteoarthritis   . GERD (gastroesophageal reflux disease)    Past Surgical History  Procedure Laterality Date  . Brain meningioma excision    . Cholecystectomy    . Tonsillectomy    . Tracheostomy     Social History:   reports that she has never smoked. She has never used smokeless tobacco. She reports that she does not drink alcohol or use illicit drugs.  Family History  Problem Relation Age of Onset  . Diabetes Mother     Medications: Reviewed at [facility]   Physical Exam: Physical Exam  Constitutional: She is oriented to person, place, and time. She appears well-developed and well-nourished.  HENT:  Head: Normocephalic and atraumatic.  Eyes:  Conjunctivae and EOM are normal. Pupils are equal, round, and reactive to light.  Neck: Normal range of motion. Neck supple. No JVD present. No thyromegaly present.  Cardiovascular: Normal rate, regular rhythm and normal heart sounds.   No murmur heard. Pulmonary/Chest: Effort normal. No respiratory distress. She has no wheezes. She has rales (bibasilar dry rales. ). She exhibits no tenderness.  Abdominal: Soft. Bowel sounds are normal. She exhibits no distension. There is no tenderness.  Musculoskeletal: Normal range of motion. She exhibits edema (ankle L>R). She exhibits no tenderness.  Lymphadenopathy:    She has no cervical adenopathy.  Neurological: She is alert and oriented to person, place, and time. She has normal reflexes. No cranial nerve deficit. She exhibits abnormal muscle tone (left sided is weaker than the right with muscle strength 5/5). Coordination abnormal.  Left sided weakness even if grip strength 5/5  Skin: No rash noted.  Surgical incision behind left era.  Psychiatric: Thought content normal. Her mood appears anxious. Her affect is angry and inappropriate. Her affect is not blunt and not labile. She is agitated. She is not aggressive, not hyperactive, not slowed, not withdrawn, not actively hallucinating and not combative. Thought content is not paranoid and not delusional. Cognition and memory are impaired. She expresses impulsivity and inappropriate judgment. She does not exhibit a depressed mood. She expresses no homicidal ideation. She expresses no homicidal plans. She exhibits abnormal recent memory. She exhibits normal remote memory. She is attentive.    Filed Vitals:   07/24/13 1432  BP: 148/82  Pulse: 72  Temp: 98.6 F (37 C)  TempSrc: Tympanic  Resp: 16      Labs reviewed: Basic Metabolic Panel:  Recent Labs  16/10/96  11/22/12 0600 11/23/12 0547 11/24/12 0528 11/29/12 01/03/13 04/20/13  NA  --   < > 140 138 138 135*  --  139  K  --   < > 3.8 3.0*  3.5  --  4.1 4.1  CL  --   < > 101 100 101  --   --   --   CO2  --   < > 28 26 30   --   --   --   GLUCOSE  --   < > 149* 313* 190*  --   --   --   BUN  --   < > 9 14 18   --  18 10  CREATININE  --   < > 0.72 0.63 0.77  --  0.8 0.9  CALCIUM  --   < > 8.7 8.6 9.0  --   --   --  TSH 1.97  --   --   --   --   --   --  2.22  < > = values in this interval not displayed. Liver Function Tests:  Recent Labs  11/23/12 0547 11/24/12 0528 11/25/12 0525 04/20/13  AST 24 16 14  11*  ALT 93* 66* 52* 8  ALKPHOS 166* 134* 135* 69  BILITOT 0.2* 0.2* 0.2*  --   PROT 7.1 6.7 6.9  --   ALBUMIN 2.9* 2.8* 3.0*  --    CBC:  Recent Labs  11/18/12 2321  11/21/12 0551 11/22/12 0600 11/23/12 0547 01/03/13 04/20/13  WBC 12.1*  < > 7.0 6.9 7.3 10.7 10.1  NEUTROABS 11.4*  --  5.4  --   --   --   --   HGB 11.0*  < > 10.8* 10.9* 11.2* 11.4* 11.3*  HCT 32.3*  < > 32.5* 32.8* 33.8* 33* 32*  MCV 83.5  < > 84.0 84.1 82.8  --   --   PLT 194  < > 182 184 206 267 208  < > = values in this interval not displayed.  Past Procedures: 12/12/12 CXR mild patchy left basilar atelectasis or pneumonitis.    Assessment/Plan Major depressive disorder, single episode, unspecified Better with Prozac 40mg  since 05/01/13 and prn Ativan used 3x 06/2013.               Unspecified hypothyroidism Takes Levothyroxine since 03/07/12, TSH 1.973 11/15/12, updated TSH 2.222 04/20/13                Unspecified constipation Stable, continue Miralax daily and Senokot S II qod              Reflux esophagitis Choking episode, diet modified to Nectar thick liquid and Purred diet. Takes Omeprazole 20mg  daily.                                         Neuralgia, neuritis, and radiculitis, unspecified Pain is managed with Lyrica 75mg             Dementia, in, senility Stabilized on Namenda and prn Ativan since her Prozac was increased in July 2014.                    Diabetes mellitus Takes Glipizide 5mg  and Metformin 500mg  bid. Hgb A1c 6.8 01/17/13                    Family/ Staff Communication: observe the patient  Goals of Care: SNF  Labs/tests ordered: none

## 2013-07-24 NOTE — Assessment & Plan Note (Signed)
Stable, continue Miralax daily and Senokot S II qod  

## 2013-07-24 NOTE — Assessment & Plan Note (Signed)
Pain is managed with Lyrica 75mg   

## 2013-07-24 NOTE — Assessment & Plan Note (Signed)
Stabilized on Namenda and prn Ativan since her Prozac was increased in July 2014.  

## 2013-07-24 NOTE — Assessment & Plan Note (Signed)
Takes Glipizide 5mg  and Metformin 500mg  bid. Hgb A1c 6.8 01/17/13

## 2013-07-24 NOTE — Assessment & Plan Note (Addendum)
Choking episode, diet modified to Nectar thick liquid and Purred diet. Takes Omeprazole 20mg  daily.

## 2013-07-24 NOTE — Assessment & Plan Note (Signed)
Better with Prozac 40mg  since 05/01/13 and prn Ativan used 3x 06/2013.

## 2013-08-28 ENCOUNTER — Encounter: Payer: Self-pay | Admitting: Nurse Practitioner

## 2013-08-28 ENCOUNTER — Non-Acute Institutional Stay (SKILLED_NURSING_FACILITY): Payer: Medicare Other | Admitting: Nurse Practitioner

## 2013-08-28 DIAGNOSIS — IMO0002 Reserved for concepts with insufficient information to code with codable children: Secondary | ICD-10-CM

## 2013-08-28 DIAGNOSIS — F0390 Unspecified dementia without behavioral disturbance: Secondary | ICD-10-CM

## 2013-08-28 DIAGNOSIS — F329 Major depressive disorder, single episode, unspecified: Secondary | ICD-10-CM

## 2013-08-28 DIAGNOSIS — E039 Hypothyroidism, unspecified: Secondary | ICD-10-CM

## 2013-08-28 DIAGNOSIS — E119 Type 2 diabetes mellitus without complications: Secondary | ICD-10-CM

## 2013-08-28 DIAGNOSIS — K5900 Constipation, unspecified: Secondary | ICD-10-CM

## 2013-08-28 NOTE — Assessment & Plan Note (Signed)
Pain is managed with Lyrica 75mg   

## 2013-08-28 NOTE — Progress Notes (Signed)
Patient ID: Sonya Garrett, female   DOB: 08/20/1929, 79 y.o.   MRN: [id number]  Code Status: DNR  Allergies  Allergen Reactions  . Codeine Phosphate     unknown  . Darvocet [Propoxyphene-Acetaminophen]     unknown  . Ibuprofen     unknown  . Prozac [Fluoxetine Hcl]     unknown  . Reglan [Metoclopramide]     unknown  . Tofranil [Imipramine Hcl]     unknown    Chief Complaint  Patient presents with  . Medical Managment of Chronic Issues   HPI: Patient is a 79 y.o. female seen in the SNF at [facility] today for evaluation of her chronic medical conditions.  Problem List Items Addressed This Visit   Dementia, in, senility     Stabilized on Namenda and prn Ativan since her Prozac was increased in July 2014.                       Diabetes mellitus - Primary (Chronic)     Takes Glipizide 5mg  and Metformin 500mg  bid. Hgb A1c 6.8 01/17/13 and 6.0 08/15/13                      Major depressive disorder, single episode, unspecified     Better with Prozac 40mg  since 05/01/13 and prn Ativan used 3x 07/2013                  Neuralgia, neuritis, and radiculitis, unspecified     Pain is managed with Lyrica 75mg                 Reflux esophagitis     Choking episode, diet modified to Nectar thick liquid and Purred diet. Takes Omeprazole 20mg  daily.                                             Unspecified constipation     Stable, continue Miralax daily and Senokot S II qod                  Unspecified hypothyroidism     Takes Levothyroxine since 03/07/12, TSH 1.973 11/15/12, updated TSH 2.222 04/20/13                       Review of Systems:  Review of Systems  Constitutional: Negative for fever, chills, weight loss, malaise/fatigue and diaphoresis.  HENT: Positive for hearing loss. Negative for congestion, ear pain and sore throat.     Eyes: Negative for pain, discharge and redness.  Respiratory: Positive for cough. Negative for sputum production, shortness of breath and wheezing.   Cardiovascular: Positive for leg swelling (chronic in ankles, L>R). Negative for chest pain, palpitations, orthopnea, claudication and PND.  Gastrointestinal: Negative for heartburn, nausea, vomiting, abdominal pain, diarrhea and constipation.  Genitourinary: Positive for frequency (chronic). Negative for dysuria, urgency and flank pain.  Musculoskeletal: Positive for back pain, joint pain and myalgias. Negative for neck pain.  Skin: Negative for itching and rash.  Neurological: Positive for focal weakness (left sided weakness grip strength 4-5/5). Negative for dizziness, tingling, tremors, speech change, seizures, loss of consciousness, weakness and headaches.  Endo/Heme/Allergies: Negative for environmental allergies and polydipsia. Does not bruise/bleed easily.  Psychiatric/Behavioral: Positive for depression and memory loss. Negative for hallucinations. The patient is  nervous/anxious. The patient does not have insomnia.      Past Medical History  Diagnosis Date  . Thyroid disease hypotyroidism  . Diabetes mellitus without complication   . Anemia   . Depression   . Vitamin D deficiency   . Sleep apnea   . Hypertension   . Osteoarthritis   . GERD (gastroesophageal reflux disease)    Past Surgical History  Procedure Laterality Date  . Brain meningioma excision    . Cholecystectomy    . Tonsillectomy    . Tracheostomy     Social History:   reports that she has never smoked. She has never used smokeless tobacco. She reports that she does not drink alcohol or use illicit drugs.  Family History  Problem Relation Age of Onset  . Diabetes Mother     Medications: Reviewed at [facility]   Physical Exam: Physical Exam  Constitutional: She is oriented to person, place, and time. She appears well-developed and well-nourished.  HENT:   Head: Normocephalic and atraumatic.  Eyes: Conjunctivae and EOM are normal. Pupils are equal, round, and reactive to light.  Neck: Normal range of motion. Neck supple. No JVD present. No thyromegaly present.  Cardiovascular: Normal rate, regular rhythm and normal heart sounds.   No murmur heard. Pulmonary/Chest: Effort normal. No respiratory distress. She has no wheezes. She has rales (bibasilar dry rales. ). She exhibits no tenderness.  Abdominal: Soft. Bowel sounds are normal. She exhibits no distension. There is no tenderness.  Musculoskeletal: Normal range of motion. She exhibits edema (ankle L>R). She exhibits no tenderness.  Lymphadenopathy:    She has no cervical adenopathy.  Neurological: She is alert and oriented to person, place, and time. She has normal reflexes. No cranial nerve deficit. She exhibits abnormal muscle tone (left sided is weaker than the right with muscle strength 5/5). Coordination abnormal.  Left sided weakness even if grip strength 5/5  Skin: No rash noted.  Surgical incision behind left era.  Psychiatric: Thought content normal. Her mood appears anxious. Her affect is angry and inappropriate. Her affect is not blunt and not labile. She is agitated. She is not aggressive, not hyperactive, not slowed, not withdrawn, not actively hallucinating and not combative. Thought content is not paranoid and not delusional. Cognition and memory are impaired. She expresses impulsivity and inappropriate judgment. She does not exhibit a depressed mood. She expresses no homicidal ideation. She expresses no homicidal plans. She exhibits abnormal recent memory. She exhibits normal remote memory. She is attentive.    Filed Vitals:   08/28/13 1536  BP: 148/82  Pulse: 72  Temp: 98.6 F (37 C)  TempSrc: Tympanic  Resp: 16      Labs reviewed: Basic Metabolic Panel:  Recent Labs  16/10/96  11/22/12 0600 11/23/12 0547 11/24/12 0528 11/29/12 01/03/13 04/20/13  NA  --   < >  140 138 138 135*  --  139  K  --   < > 3.8 3.0* 3.5  --  4.1 4.1  CL  --   < > 101 100 101  --   --   --   CO2  --   < > 28 26 30   --   --   --   GLUCOSE  --   < > 149* 313* 190*  --   --   --   BUN  --   < > 9 14 18   --  18 10  CREATININE  --   < > 0.72 0.63  0.77  --  0.8 0.9  CALCIUM  --   < > 8.7 8.6 9.0  --   --   --   TSH 1.97  --   --   --   --   --   --  2.22  < > = values in this interval not displayed. Liver Function Tests:  Recent Labs  11/23/12 0547 11/24/12 0528 11/25/12 0525 04/20/13  AST 24 16 14  11*  ALT 93* 66* 52* 8  ALKPHOS 166* 134* 135* 69  BILITOT 0.2* 0.2* 0.2*  --   PROT 7.1 6.7 6.9  --   ALBUMIN 2.9* 2.8* 3.0*  --    CBC:  Recent Labs  11/18/12 2321  11/21/12 0551 11/22/12 0600 11/23/12 0547 01/03/13 04/20/13  WBC 12.1*  < > 7.0 6.9 7.3 10.7 10.1  NEUTROABS 11.4*  --  5.4  --   --   --   --   HGB 11.0*  < > 10.8* 10.9* 11.2* 11.4* 11.3*  HCT 32.3*  < > 32.5* 32.8* 33.8* 33* 32*  MCV 83.5  < > 84.0 84.1 82.8  --   --   PLT 194  < > 182 184 206 267 208  < > = values in this interval not displayed.  Past Procedures: 12/12/12 CXR mild patchy left basilar atelectasis or pneumonitis.    Assessment/Plan Diabetes mellitus Takes Glipizide 5mg  and Metformin 500mg  bid. Hgb A1c 6.8 01/17/13 and 6.0 08/15/13                    Unspecified hypothyroidism Takes Levothyroxine since 03/07/12, TSH 1.973 11/15/12, updated TSH 2.222 04/20/13                  Unspecified constipation Stable, continue Miralax daily and Senokot S II qod                Major depressive disorder, single episode, unspecified Better with Prozac 40mg  since 05/01/13 and prn Ativan used 3x 07/2013                Neuralgia, neuritis, and radiculitis, unspecified Pain is managed with Lyrica 75mg               Reflux esophagitis Choking episode, diet modified to Nectar thick liquid and Purred diet. Takes  Omeprazole 20mg  daily.                                           Dementia, in, senility Stabilized on Namenda and prn Ativan since her Prozac was increased in July 2014.                       Family/ Staff Communication: observe the patient  Goals of Care: SNF  Labs/tests ordered: none

## 2013-08-28 NOTE — Assessment & Plan Note (Signed)
Takes Glipizide 5mg  and Metformin 500mg  bid. Hgb A1c 6.8 01/17/13 and 6.0 08/15/13

## 2013-08-28 NOTE — Assessment & Plan Note (Signed)
Takes Levothyroxine 50mcg since 03/07/12, TSH 1.973 11/15/12, updated TSH 2.222 04/20/13           

## 2013-08-28 NOTE — Assessment & Plan Note (Signed)
Stabilized on Namenda and prn Ativan since her Prozac was increased in July 2014.  

## 2013-08-28 NOTE — Assessment & Plan Note (Addendum)
Better with Prozac 40mg since 05/01/13 and prn Ativan used 3x 07/2013                     

## 2013-08-28 NOTE — Assessment & Plan Note (Signed)
Stable, continue Miralax daily and Senokot S II qod  

## 2013-08-28 NOTE — Assessment & Plan Note (Signed)
Choking episode, diet modified to Nectar thick liquid and Purred diet. Takes Omeprazole 20mg daily.                                        

## 2013-09-06 ENCOUNTER — Encounter: Payer: Self-pay | Admitting: Nurse Practitioner

## 2013-09-06 ENCOUNTER — Non-Acute Institutional Stay (SKILLED_NURSING_FACILITY): Payer: Medicare Other | Admitting: Nurse Practitioner

## 2013-09-06 DIAGNOSIS — R609 Edema, unspecified: Secondary | ICD-10-CM

## 2013-09-06 DIAGNOSIS — I10 Essential (primary) hypertension: Secondary | ICD-10-CM

## 2013-09-06 DIAGNOSIS — E039 Hypothyroidism, unspecified: Secondary | ICD-10-CM

## 2013-09-06 DIAGNOSIS — M25569 Pain in unspecified knee: Secondary | ICD-10-CM

## 2013-09-06 DIAGNOSIS — E119 Type 2 diabetes mellitus without complications: Secondary | ICD-10-CM

## 2013-09-06 DIAGNOSIS — K5900 Constipation, unspecified: Secondary | ICD-10-CM

## 2013-09-06 DIAGNOSIS — R110 Nausea: Secondary | ICD-10-CM

## 2013-09-06 DIAGNOSIS — F0390 Unspecified dementia without behavioral disturbance: Secondary | ICD-10-CM

## 2013-09-06 DIAGNOSIS — F329 Major depressive disorder, single episode, unspecified: Secondary | ICD-10-CM

## 2013-09-06 NOTE — Assessment & Plan Note (Signed)
Takes Levothyroxine since 03/07/12, TSH 1.973 11/15/12, updated TSH 2.222 04/20/13, update TSH

## 2013-09-06 NOTE — Assessment & Plan Note (Signed)
Takes Glipizide 5mg  and Metformin 500mg  bid-hold to eliminate possible cause of nausea. Hgb A1c 6.8 01/17/13 and 6.0 08/15/13

## 2013-09-06 NOTE — Assessment & Plan Note (Signed)
Stable, continue Miralax daily and Senokot S II qod  

## 2013-09-06 NOTE — Assessment & Plan Note (Signed)
Stabilized on Namenda and prn Ativan since her Prozac was increased in July 2014.  

## 2013-09-06 NOTE — Progress Notes (Signed)
Patient ID: Sonya Garrett, female   DOB: 1928-10-13, 79 y.o.   MRN: [id number]   Code Status: DNR  Allergies  Allergen Reactions  . Codeine Phosphate     unknown  . Darvocet [Propoxyphene-Acetaminophen]     unknown  . Ibuprofen     unknown  . Prozac [Fluoxetine Hcl]     unknown  . Reglan [Metoclopramide]     unknown  . Tofranil [Imipramine Hcl]     unknown    Chief Complaint  Patient presents with  . Medical Managment of Chronic Issues    nausea  . Dementia  . Acute Visit    HPI: Patient is a 79 y.o. female seen in the SNF at [facility] today for evaluation of  Nausea and other chronic medical conditions.  Problem List Items Addressed This Visit   Unspecified hypothyroidism     Takes Levothyroxine since 03/07/12, TSH 1.973 11/15/12, updated TSH 2.222 04/20/13, update TSH                      Unspecified constipation     Stable, continue Miralax daily and Senokot S II qod                    Reflux esophagitis     Choking episode, diet modified to Nectar thick liquid and Purred diet. Takes Omeprazole 20mg  daily. Adding Carafate 1gm ac and hs. May consider Reglan if no better.                                               Pain in joint, lower leg     Pain is managed with Lyrica 75mg                 Nausea alone - Primary     C/o nausea, no vomiting or diarrhea, denied abd, suprapubic, or CVA tenderness. Last BM this am. The patient and her husband stated she has hx of nausea and used to be taking Phenergan frequently. Will hold Glipizide and Metformin to eliminate medication related nausea. Check CBC, CMP, UA C/S in am. Zofran 4mg  tid for symptomatic management.     Major depressive disorder, single episode, unspecified     Better with Prozac 40mg  since 05/01/13 and prn Ativan used 3x 07/2013                    Hypertension     Controlled.     Edema   Left ankle> the right, chronic, weight 187 Ibs-182 Ibs-no significant weight change.               Diabetes mellitus (Chronic)     Takes Glipizide 5mg  and Metformin 500mg  bid-hold to eliminate possible cause of nausea. Hgb A1c 6.8 01/17/13 and 6.0 08/15/13                      Dementia, in, senility     Stabilized on Namenda and prn Ativan since her Prozac was increased in July 2014.                            Review of Systems:  Review of Systems  Constitutional: Negative for fever, chills, weight loss, malaise/fatigue and diaphoresis.  HENT: Positive for hearing loss. Negative  for congestion, ear pain and sore throat.   Eyes: Negative for pain, discharge and redness.  Respiratory: Positive for cough. Negative for sputum production, shortness of breath and wheezing.   Cardiovascular: Positive for leg swelling (chronic in ankles, L>R). Negative for chest pain, palpitations, orthopnea, claudication and PND.  Gastrointestinal: Negative for heartburn, nausea, vomiting, abdominal pain, diarrhea and constipation.  Genitourinary: Positive for frequency (chronic). Negative for dysuria, urgency and flank pain.  Musculoskeletal: Positive for back pain, joint pain and myalgias. Negative for neck pain.  Skin: Negative for itching and rash.  Neurological: Positive for focal weakness (left sided weakness grip strength 4-5/5). Negative for dizziness, tingling, tremors, speech change, seizures, loss of consciousness, weakness and headaches.  Endo/Heme/Allergies: Negative for environmental allergies and polydipsia. Does not bruise/bleed easily.  Psychiatric/Behavioral: Positive for depression and memory loss. Negative for hallucinations. The patient is nervous/anxious. The patient does not have insomnia.      Past Medical History  Diagnosis Date  . Thyroid disease hypotyroidism  . Diabetes mellitus without complication   . Anemia   . Depression   .  Vitamin D deficiency   . Sleep apnea   . Hypertension   . Osteoarthritis   . GERD (gastroesophageal reflux disease)    Past Surgical History  Procedure Laterality Date  . Brain meningioma excision    . Cholecystectomy    . Tonsillectomy    . Tracheostomy     Social History:   reports that she has never smoked. She has never used smokeless tobacco. She reports that she does not drink alcohol or use illicit drugs.  Family History  Problem Relation Age of Onset  . Diabetes Mother     Medications: Patient's Medications  New Prescriptions   No medications on file  Previous Medications   ACETAMINOPHEN (TYLENOL) 325 MG TABLET    Take 650 mg by mouth every 6 (six) hours as needed for pain.   ALBUTEROL (PROVENTIL) (5 MG/ML) 0.5% NEBULIZER SOLUTION    Take 0.5 mLs (2.5 mg total) by nebulization every 2 (two) hours as needed for wheezing or shortness of breath.   ALBUTEROL (PROVENTIL) (5 MG/ML) 0.5% NEBULIZER SOLUTION    Take 0.5 mLs (2.5 mg total) by nebulization every 6 (six) hours.   CALCIUM CARBONATE (TUMS - DOSED IN MG ELEMENTAL CALCIUM) 500 MG CHEWABLE TABLET    Chew 1 tablet by mouth 2 (two) times daily as needed for heartburn.   CHOLECALCIFEROL (VITAMIN D-3) 1000 UNITS CAPS    Take 1,000 Units by mouth daily.   FLUOXETINE (PROZAC) 40 MG CAPSULE    Take 40 mg by mouth daily.   GLIPIZIDE (GLUCOTROL) 5 MG TABLET    Take 5 mg by mouth 2 (two) times daily before a meal.   HYDROXYPROPYL METHYLCELLULOSE (ISOPTO TEARS) 2.5 % OPHTHALMIC SOLUTION    Place 1 drop into both eyes 2 (two) times daily.   IPRATROPIUM (ATROVENT) 0.02 % NEBULIZER SOLUTION    Take 2.5 mLs (0.5 mg total) by nebulization every 6 (six) hours.   LEVOFLOXACIN (LEVAQUIN) 500 MG TABLET    Take 1 tablet (500 mg total) by mouth daily.   LEVOTHYROXINE (SYNTHROID, LEVOTHROID) 50 MCG TABLET    Take 50 mcg by mouth daily. Take 1 tab by mouth every day. * Check pulse weekly on Monday*   LORAZEPAM (ATIVAN) 0.5 MG TABLET    Take 0.5  mg by mouth every 8 (eight) hours as needed for anxiety.   MEMANTINE (NAMENDA) 10 MG TABLET  Take 10 mg by mouth 2 (two) times daily.   METFORMIN (GLUCOPHAGE) 500 MG TABLET    Take 500 mg by mouth 2 (two) times daily with a meal. Take with food.   NYSTATIN (MYCOSTATIN/NYSTOP) 100000 UNIT/GM POWD    Apply 1 g topically 2 (two) times daily as needed (applied to skin folds twice daily as needed for rash).   NYSTATIN-TRIAMCINOLONE (MYCOLOG II) CREAM    Apply 1 application topically at bedtime. To perineal area   OMEPRAZOLE (PRILOSEC) 20 MG CAPSULE    Take 20 mg by mouth daily.   POLYETHYLENE GLYCOL (MIRALAX / GLYCOLAX) PACKET    Take 17 g by mouth daily. Fill cap to 17 gm mark, mix with 8 ounces of water and take by mouth every day.   PREDNISONE (DELTASONE) 10 MG TABLET    Take 3 tabs daily for 3 days, then 2 tabs daily for 3 days, then 1 tab daily for 3 days, then stop.   PREGABALIN (LYRICA) 75 MG CAPSULE    Take 75 mg by mouth at bedtime.   SACCHAROMYCES BOULARDII (FLORASTOR) 250 MG CAPSULE    Take 250 mg by mouth 2 (two) times daily.   SENNA-DOCUSATE (SENOKOT-S) 8.6-50 MG PER TABLET    Take 2 tablets by mouth every other day.   VITAMIN B-12 (CYANOCOBALAMIN) 1000 MCG TABLET    Take 1,000 mcg by mouth every evening.  Modified Medications   No medications on file  Discontinued Medications   No medications on file     Physical Exam: Physical Exam  Constitutional: She is oriented to person, place, and time. She appears well-developed and well-nourished.  HENT:  Head: Normocephalic and atraumatic.  Eyes: Conjunctivae and EOM are normal. Pupils are equal, round, and reactive to light.  Neck: Normal range of motion. Neck supple. No JVD present. No thyromegaly present.  Cardiovascular: Normal rate, regular rhythm and normal heart sounds.   No murmur heard. Pulmonary/Chest: Effort normal. No respiratory distress. She has no wheezes. She has rales (bibasilar dry rales. ). She exhibits no  tenderness.  Abdominal: Soft. Bowel sounds are normal. She exhibits no distension. There is no tenderness.  Musculoskeletal: Normal range of motion. She exhibits edema (ankle L>R). She exhibits no tenderness.  Lymphadenopathy:    She has no cervical adenopathy.  Neurological: She is alert and oriented to person, place, and time. She has normal reflexes. No cranial nerve deficit. She exhibits abnormal muscle tone (left sided is weaker than the right with muscle strength 5/5). Coordination abnormal.  Left sided weakness even if grip strength 5/5  Skin: No rash noted.  Surgical incision behind left era.  Psychiatric: Thought content normal. Her mood appears anxious. Her affect is angry and inappropriate. Her affect is not blunt and not labile. She is agitated. She is not aggressive, not hyperactive, not slowed, not withdrawn, not actively hallucinating and not combative. Thought content is not paranoid and not delusional. Cognition and memory are impaired. She expresses impulsivity and inappropriate judgment. She does not exhibit a depressed mood. She expresses no homicidal ideation. She expresses no homicidal plans. She exhibits abnormal recent memory. She exhibits normal remote memory. She is attentive.    Filed Vitals:   09/06/13 1616  BP: 136/70  Pulse: 74  Temp: 98.2 F (36.8 C)  TempSrc: Tympanic  Resp: 17      Labs reviewed: Basic Metabolic Panel:  Recent Labs  16/10/96  11/22/12 0600 11/23/12 0547 11/24/12 0528 11/29/12 01/03/13 04/20/13  NA  --   < >  140 138 138 135*  --  139  K  --   < > 3.8 3.0* 3.5  --  4.1 4.1  CL  --   < > 101 100 101  --   --   --   CO2  --   < > 28 26 30   --   --   --   GLUCOSE  --   < > 149* 313* 190*  --   --   --   BUN  --   < > 9 14 18   --  18 10  CREATININE  --   < > 0.72 0.63 0.77  --  0.8 0.9  CALCIUM  --   < > 8.7 8.6 9.0  --   --   --   TSH 1.97  --   --   --   --   --   --  2.22  < > = values in this interval not displayed. Liver  Function Tests:  Recent Labs  11/23/12 0547 11/24/12 0528 11/25/12 0525 04/20/13  AST 24 16 14  11*  ALT 93* 66* 52* 8  ALKPHOS 166* 134* 135* 69  BILITOT 0.2* 0.2* 0.2*  --   PROT 7.1 6.7 6.9  --   ALBUMIN 2.9* 2.8* 3.0*  --    CBC:  Recent Labs  11/18/12 2321  11/21/12 0551 11/22/12 0600 11/23/12 0547 01/03/13 04/20/13  WBC 12.1*  < > 7.0 6.9 7.3 10.7 10.1  NEUTROABS 11.4*  --  5.4  --   --   --   --   HGB 11.0*  < > 10.8* 10.9* 11.2* 11.4* 11.3*  HCT 32.3*  < > 32.5* 32.8* 33.8* 33* 32*  MCV 83.5  < > 84.0 84.1 82.8  --   --   PLT 194  < > 182 184 206 267 208  < > = values in this interval not displayed.  Assessment/Plan Nausea alone C/o nausea, no vomiting or diarrhea, denied abd, suprapubic, or CVA tenderness. Last BM this am. The patient and her husband stated she has hx of nausea and used to be taking Phenergan frequently. Will hold Glipizide and Metformin to eliminate medication related nausea. Check CBC, CMP, UA C/S in am. Zofran 4mg  tid for symptomatic management.   Major depressive disorder, single episode, unspecified Better with Prozac 40mg  since 05/01/13 and prn Ativan used 3x 07/2013                  Unspecified hypothyroidism Takes Levothyroxine since 03/07/12, TSH 1.973 11/15/12, updated TSH 2.222 04/20/13, update TSH                    Unspecified constipation Stable, continue Miralax daily and Senokot S II qod                  Reflux esophagitis Choking episode, diet modified to Nectar thick liquid and Purred diet. Takes Omeprazole 20mg  daily. Adding Carafate 1gm ac and hs. May consider Reglan if no better.                                             Pain in joint, lower leg Pain is managed with Lyrica 75mg               Hypertension Controlled.   Edema Left ankle> the right, chronic, weight  187 Ibs-182 Ibs-no significant weight change.              Diabetes mellitus Takes Glipizide 5mg  and Metformin 500mg  bid-hold to eliminate possible cause of nausea. Hgb A1c 6.8 01/17/13 and 6.0 08/15/13                    Dementia, in, senility Stabilized on Namenda and prn Ativan since her Prozac was increased in July 2014.                         Family/ Staff Communication: observe the patient  Goals of Care: SNF  Labs/tests ordered: CBC, CMP, TSH, Cath UA C./S in am

## 2013-09-06 NOTE — Assessment & Plan Note (Signed)
C/o nausea, no vomiting or diarrhea, denied abd, suprapubic, or CVA tenderness. Last BM this am. The patient and her husband stated she has hx of nausea and used to be taking Phenergan frequently. Will hold Glipizide and Metformin to eliminate medication related nausea. Check CBC, CMP, UA C/S in am. Zofran 4mg  tid for symptomatic management.

## 2013-09-06 NOTE — Assessment & Plan Note (Signed)
Choking episode, diet modified to Nectar thick liquid and Purred diet. Takes Omeprazole 20mg  daily. Adding Carafate 1gm ac and hs. May consider Reglan if no better.

## 2013-09-06 NOTE — Assessment & Plan Note (Signed)
Left ankle> the right, chronic, weight 187 Ibs-182 Ibs-no significant weight change.

## 2013-09-06 NOTE — Assessment & Plan Note (Signed)
Better with Prozac 40mg since 05/01/13 and prn Ativan used 3x 07/2013                     

## 2013-09-06 NOTE — Assessment & Plan Note (Signed)
Controlled.  

## 2013-09-06 NOTE — Assessment & Plan Note (Signed)
Pain is managed with Lyrica 75mg   

## 2013-09-12 LAB — CBC AND DIFFERENTIAL
HCT: 32 % — AB (ref 36–46)
Hemoglobin: 10.8 g/dL — AB (ref 12.0–16.0)
Platelets: 238 10*3/uL (ref 150–399)

## 2013-09-12 LAB — BASIC METABOLIC PANEL
Glucose: 101 mg/dL
Sodium: 137 mmol/L (ref 137–147)

## 2013-09-12 LAB — HEPATIC FUNCTION PANEL
AST: 9 U/L — AB (ref 13–35)
Alkaline Phosphatase: 68 U/L (ref 25–125)

## 2013-09-12 LAB — TSH: TSH: 1.39 u[IU]/mL (ref 0.41–5.90)

## 2013-09-13 ENCOUNTER — Encounter: Payer: Self-pay | Admitting: Nurse Practitioner

## 2013-09-13 ENCOUNTER — Non-Acute Institutional Stay (SKILLED_NURSING_FACILITY): Payer: Medicare Other | Admitting: Nurse Practitioner

## 2013-09-13 DIAGNOSIS — E876 Hypokalemia: Secondary | ICD-10-CM

## 2013-09-13 DIAGNOSIS — I10 Essential (primary) hypertension: Secondary | ICD-10-CM

## 2013-09-13 DIAGNOSIS — K5900 Constipation, unspecified: Secondary | ICD-10-CM

## 2013-09-13 DIAGNOSIS — G8100 Flaccid hemiplegia affecting unspecified side: Secondary | ICD-10-CM

## 2013-09-13 DIAGNOSIS — E039 Hypothyroidism, unspecified: Secondary | ICD-10-CM

## 2013-09-13 DIAGNOSIS — F329 Major depressive disorder, single episode, unspecified: Secondary | ICD-10-CM

## 2013-09-13 DIAGNOSIS — F0390 Unspecified dementia without behavioral disturbance: Secondary | ICD-10-CM

## 2013-09-13 DIAGNOSIS — D649 Anemia, unspecified: Secondary | ICD-10-CM

## 2013-09-13 DIAGNOSIS — R609 Edema, unspecified: Secondary | ICD-10-CM

## 2013-09-13 DIAGNOSIS — IMO0002 Reserved for concepts with insufficient information to code with codable children: Secondary | ICD-10-CM

## 2013-09-13 DIAGNOSIS — E119 Type 2 diabetes mellitus without complications: Secondary | ICD-10-CM

## 2013-09-13 DIAGNOSIS — R110 Nausea: Secondary | ICD-10-CM

## 2013-09-13 NOTE — Assessment & Plan Note (Signed)
Pain is managed with Lyrica 75mg   

## 2013-09-13 NOTE — Assessment & Plan Note (Signed)
Controlled.  

## 2013-09-13 NOTE — Assessment & Plan Note (Signed)
Better with Prozac 40mg since 05/01/13 and prn Ativan used 3x 07/2013                     

## 2013-09-13 NOTE — Assessment & Plan Note (Signed)
Mild, serum K 3.4 09/12/13-repeat BMP in am-her nausea has resolved and hope Kcl is not needed since her oral intake is better.

## 2013-09-13 NOTE — Progress Notes (Signed)
Patient ID: Sonya Garrett, female   DOB: Mar 01, 1929, 79 y.o.   MRN: [id number]   Code Status: DNR  Allergies  Allergen Reactions  . Codeine Phosphate     unknown  . Darvocet [Propoxyphene-Acetaminophen]     unknown  . Ibuprofen     unknown  . Prozac [Fluoxetine Hcl]     unknown  . Reglan [Metoclopramide]     unknown  . Tofranil [Imipramine Hcl]     unknown    Chief Complaint  Patient presents with  . Medical Managment of Chronic Issues    anemia, hypokalemia,   . Acute Visit    HPI: Patient is a 79 y.o. female seen in the SNF at [facility] today for evaluation of anemia, hypokalemia, and other chronic medical conditions.  Problem List Items Addressed This Visit   Diabetes mellitus (Chronic)     Continue to hold Glipizide 5mg  and Metformin 500mg  bid for 2 weeks since her am CBG average is 130s while both of them on hold--may consider diet control her blood sugar since her nausea got better while on hold or may consider insulin. Hgb A1c 6.8 01/17/13 and 6.0 08/15/13                        Hypertension     Controlled.       Hypokalemia     Mild, serum K 3.4 09/12/13-repeat BMP in am-her nausea has resolved and hope Kcl is not needed since her oral intake is better.     Unspecified hypothyroidism     Takes Levothyroxine since 03/07/12, TSH 1.973 11/15/12, updated TSH 2.222 04/20/13, 1.386 09/12/13                        Anemia, unspecified     Hgb 11.4 01/03/13--10.8 09/12/13--anemia panel, Guaiac stools x3      Major depressive disorder, single episode, unspecified     Better with Prozac 40mg  since 05/01/13 and prn Ativan used 3x 07/2013                      Reflux esophagitis     Choking episode, diet modified to Nectar thick liquid and Purred diet. Better with Omeprazole 20mg  daily and Carafate 1gm ac and hs.                                                 Unspecified constipation     Stable, continue Miralax daily and Senokot S II qod                      Neuralgia, neuritis, and radiculitis, unspecified     Pain is managed with Lyrica 75mg                   Edema     Left ankle> the right, chronic, weight 187 Ibs-182 Ibs-no significant weight change.                 Flaccid hemiplegia affecting nondominant side     Left sided even if muscle strength 4-5/5      Dementia, in, senility - Primary     Stabilized on Namenda and prn Ativan since her Prozac was increased in July 2014.  Nausea alone     C/o nausea, no vomiting or diarrhea, denied abd, suprapubic, or CVA tenderness. Last BM this am. The patient and her husband stated she has hx of nausea and used to be taking Phenergan frequently. Better since held Glipizide and Metformin and Zofran 4mg  tid for 3 days. Will continue to hold Glipizide/Metformin and observe the patient.          Review of Systems:  Review of Systems  Constitutional: Negative for fever, chills, weight loss, malaise/fatigue and diaphoresis.  HENT: Positive for hearing loss. Negative for congestion, ear pain and sore throat.   Eyes: Negative for pain, discharge and redness.  Respiratory: Positive for cough. Negative for sputum production, shortness of breath and wheezing.   Cardiovascular: Positive for leg swelling (chronic in ankles, L>R). Negative for chest pain, palpitations, orthopnea, claudication and PND.  Gastrointestinal: Negative for heartburn, nausea, vomiting, abdominal pain, diarrhea and constipation.  Genitourinary: Positive for frequency (chronic). Negative for dysuria, urgency and flank pain.  Musculoskeletal: Positive for back pain, joint pain and myalgias. Negative for neck pain.  Skin: Negative for itching and rash.  Neurological: Positive for focal weakness (left sided weakness grip strength 4-5/5).  Negative for dizziness, tingling, tremors, speech change, seizures, loss of consciousness, weakness and headaches.  Endo/Heme/Allergies: Negative for environmental allergies and polydipsia. Does not bruise/bleed easily.  Psychiatric/Behavioral: Positive for depression and memory loss. Negative for hallucinations. The patient is nervous/anxious. The patient does not have insomnia.      Past Medical History  Diagnosis Date  . Thyroid disease hypotyroidism  . Diabetes mellitus without complication   . Anemia   . Depression   . Vitamin D deficiency   . Sleep apnea   . Hypertension   . Osteoarthritis   . GERD (gastroesophageal reflux disease)    Past Surgical History  Procedure Laterality Date  . Brain meningioma excision    . Cholecystectomy    . Tonsillectomy    . Tracheostomy     Social History:   reports that she has never smoked. She has never used smokeless tobacco. She reports that she does not drink alcohol or use illicit drugs.  Family History  Problem Relation Age of Onset  . Diabetes Mother     Medications: Patient's Medications  New Prescriptions   No medications on file  Previous Medications   ACETAMINOPHEN (TYLENOL) 325 MG TABLET    Take 650 mg by mouth every 6 (six) hours as needed for pain.   ALBUTEROL (PROVENTIL) (5 MG/ML) 0.5% NEBULIZER SOLUTION    Take 0.5 mLs (2.5 mg total) by nebulization every 2 (two) hours as needed for wheezing or shortness of breath.   ALBUTEROL (PROVENTIL) (5 MG/ML) 0.5% NEBULIZER SOLUTION    Take 0.5 mLs (2.5 mg total) by nebulization every 6 (six) hours.   CALCIUM CARBONATE (TUMS - DOSED IN MG ELEMENTAL CALCIUM) 500 MG CHEWABLE TABLET    Chew 1 tablet by mouth 2 (two) times daily as needed for heartburn.   CHOLECALCIFEROL (VITAMIN D-3) 1000 UNITS CAPS    Take 1,000 Units by mouth daily.   FLUOXETINE (PROZAC) 40 MG CAPSULE    Take 40 mg by mouth daily.   GLIPIZIDE (GLUCOTROL) 5 MG TABLET    Take 5 mg by mouth 2 (two) times daily  before a meal.   HYDROXYPROPYL METHYLCELLULOSE (ISOPTO TEARS) 2.5 % OPHTHALMIC SOLUTION    Place 1 drop into both eyes 2 (two) times daily.   IPRATROPIUM (ATROVENT) 0.02 % NEBULIZER SOLUTION  Take 2.5 mLs (0.5 mg total) by nebulization every 6 (six) hours.   LEVOFLOXACIN (LEVAQUIN) 500 MG TABLET    Take 1 tablet (500 mg total) by mouth daily.   LEVOTHYROXINE (SYNTHROID, LEVOTHROID) 50 MCG TABLET    Take 50 mcg by mouth daily. Take 1 tab by mouth every day. * Check pulse weekly on Monday*   LORAZEPAM (ATIVAN) 0.5 MG TABLET    Take 0.5 mg by mouth every 8 (eight) hours as needed for anxiety.   MEMANTINE (NAMENDA) 10 MG TABLET    Take 10 mg by mouth 2 (two) times daily.   METFORMIN (GLUCOPHAGE) 500 MG TABLET    Take 500 mg by mouth 2 (two) times daily with a meal. Take with food.   NYSTATIN (MYCOSTATIN/NYSTOP) 100000 UNIT/GM POWD    Apply 1 g topically 2 (two) times daily as needed (applied to skin folds twice daily as needed for rash).   NYSTATIN-TRIAMCINOLONE (MYCOLOG II) CREAM    Apply 1 application topically at bedtime. To perineal area   OMEPRAZOLE (PRILOSEC) 20 MG CAPSULE    Take 20 mg by mouth daily.   POLYETHYLENE GLYCOL (MIRALAX / GLYCOLAX) PACKET    Take 17 g by mouth daily. Fill cap to 17 gm mark, mix with 8 ounces of water and take by mouth every day.   PREDNISONE (DELTASONE) 10 MG TABLET    Take 3 tabs daily for 3 days, then 2 tabs daily for 3 days, then 1 tab daily for 3 days, then stop.   PREGABALIN (LYRICA) 75 MG CAPSULE    Take 75 mg by mouth at bedtime.   SACCHAROMYCES BOULARDII (FLORASTOR) 250 MG CAPSULE    Take 250 mg by mouth 2 (two) times daily.   SENNA-DOCUSATE (SENOKOT-S) 8.6-50 MG PER TABLET    Take 2 tablets by mouth every other day.   VITAMIN B-12 (CYANOCOBALAMIN) 1000 MCG TABLET    Take 1,000 mcg by mouth every evening.  Modified Medications   No medications on file  Discontinued Medications   No medications on file     Physical Exam: Physical Exam    Constitutional: She is oriented to person, place, and time. She appears well-developed and well-nourished.  HENT:  Head: Normocephalic and atraumatic.  Eyes: Conjunctivae and EOM are normal. Pupils are equal, round, and reactive to light.  Neck: Normal range of motion. Neck supple. No JVD present. No thyromegaly present.  Cardiovascular: Normal rate, regular rhythm and normal heart sounds.   No murmur heard. Pulmonary/Chest: Effort normal. No respiratory distress. She has no wheezes. She has rales (bibasilar dry rales. ). She exhibits no tenderness.  Abdominal: Soft. Bowel sounds are normal. She exhibits no distension. There is no tenderness.  Musculoskeletal: Normal range of motion. She exhibits edema (ankle L>R). She exhibits no tenderness.  Lymphadenopathy:    She has no cervical adenopathy.  Neurological: She is alert and oriented to person, place, and time. She has normal reflexes. No cranial nerve deficit. She exhibits abnormal muscle tone (left sided is weaker than the right with muscle strength 5/5). Coordination abnormal.  Left sided weakness even if grip strength 5/5  Skin: No rash noted.  Surgical incision behind left era.  Psychiatric: Thought content normal. Her mood appears anxious. Her affect is angry and inappropriate. Her affect is not blunt and not labile. She is agitated. She is not aggressive, not hyperactive, not slowed, not withdrawn, not actively hallucinating and not combative. Thought content is not paranoid and not delusional. Cognition and memory  are impaired. She expresses impulsivity and inappropriate judgment. She does not exhibit a depressed mood. She expresses no homicidal ideation. She expresses no homicidal plans. She exhibits abnormal recent memory. She exhibits normal remote memory. She is attentive.    Filed Vitals:   09/13/13 1632  BP: 132/70  Pulse: 70  Temp: 98.2 F (36.8 C)  TempSrc: Tympanic  Resp: 16      Labs reviewed: Basic Metabolic  Panel:  Recent Labs  11/15/12  11/22/12 0600 11/23/12 0547 11/24/12 0528 11/29/12 01/03/13 04/20/13 09/12/13  NA  --   < > 140 138 138 135*  --  139 137  K  --   < > 3.8 3.0* 3.5  --  4.1 4.1 3.4  CL  --   < > 101 100 101  --   --   --   --   CO2  --   < > 28 26 30   --   --   --   --   GLUCOSE  --   < > 149* 313* 190*  --   --   --   --   BUN  --   < > 9 14 18   --  18 10 9   CREATININE  --   < > 0.72 0.63 0.77  --  0.8 0.9 0.8  CALCIUM  --   < > 8.7 8.6 9.0  --   --   --   --   TSH 1.97  --   --   --   --   --   --  2.22 1.39  < > = values in this interval not displayed. Liver Function Tests:  Recent Labs  11/23/12 0547 11/24/12 0528 11/25/12 0525 04/20/13 09/12/13  AST 24 16 14  11* 9*  ALT 93* 66* 52* 8 8  ALKPHOS 166* 134* 135* 69 68  BILITOT 0.2* 0.2* 0.2*  --   --   PROT 7.1 6.7 6.9  --   --   ALBUMIN 2.9* 2.8* 3.0*  --   --    CBC:  Recent Labs  11/18/12 2321  11/21/12 0551 11/22/12 0600 11/23/12 0547 01/03/13 04/20/13 09/12/13  WBC 12.1*  < > 7.0 6.9 7.3 10.7 10.1 9.8  NEUTROABS 11.4*  --  5.4  --   --   --   --   --   HGB 11.0*  < > 10.8* 10.9* 11.2* 11.4* 11.3* 10.8*  HCT 32.3*  < > 32.5* 32.8* 33.8* 33* 32* 32*  MCV 83.5  < > 84.0 84.1 82.8  --   --   --   PLT 194  < > 182 184 206 267 208 238  < > = values in this interval not displayed.  Assessment/Plan Dementia, in, senility Stabilized on Namenda and prn Ativan since her Prozac was increased in July 2014.                         Flaccid hemiplegia affecting nondominant side Left sided even if muscle strength 4-5/5    Edema Left ankle> the right, chronic, weight 187 Ibs-182 Ibs-no significant weight change.               Neuralgia, neuritis, and radiculitis, unspecified Pain is managed with Lyrica 75mg                 Unspecified constipation Stable, continue Miralax daily and Senokot S II qod  Reflux  esophagitis Choking episode, diet modified to Nectar thick liquid and Purred diet. Better with Omeprazole 20mg  daily and Carafate 1gm ac and hs.                                               Major depressive disorder, single episode, unspecified Better with Prozac 40mg  since 05/01/13 and prn Ativan used 3x 07/2013                    Anemia, unspecified Hgb 11.4 01/03/13--10.8 09/12/13--anemia panel, Guaiac stools x3    Unspecified hypothyroidism Takes Levothyroxine since 03/07/12, TSH 1.973 11/15/12, updated TSH 2.222 04/20/13, 1.386 09/12/13                      Hypokalemia Mild, serum K 3.4 09/12/13-repeat BMP in am-her nausea has resolved and hope Kcl is not needed since her oral intake is better.   Hypertension Controlled.     Diabetes mellitus Continue to hold Glipizide 5mg  and Metformin 500mg  bid for 2 weeks since her am CBG average is 130s while both of them on hold--may consider diet control her blood sugar since her nausea got better while on hold or may consider insulin. Hgb A1c 6.8 01/17/13 and 6.0 08/15/13                      Nausea alone C/o nausea, no vomiting or diarrhea, denied abd, suprapubic, or CVA tenderness. Last BM this am. The patient and her husband stated she has hx of nausea and used to be taking Phenergan frequently. Better since held Glipizide and Metformin and Zofran 4mg  tid for 3 days. Will continue to hold Glipizide/Metformin and observe the patient.       Family/ Staff Communication: observe the patient  Goals of Care: SNF  Labs/tests ordered: BMP, anemia panel, Guaiac stools x3

## 2013-09-13 NOTE — Assessment & Plan Note (Signed)
C/o nausea, no vomiting or diarrhea, denied abd, suprapubic, or CVA tenderness. Last BM this am. The patient and her husband stated she has hx of nausea and used to be taking Phenergan frequently. Better since held Glipizide and Metformin and Zofran 4mg  tid for 3 days. Will continue to hold Glipizide/Metformin and observe the patient.

## 2013-09-13 NOTE — Assessment & Plan Note (Signed)
Stable, continue Miralax daily and Senokot S II qod  

## 2013-09-13 NOTE — Assessment & Plan Note (Signed)
Left ankle> the right, chronic, weight 187 Ibs-182 Ibs-no significant weight change.            

## 2013-09-13 NOTE — Assessment & Plan Note (Signed)
Stabilized on Namenda and prn Ativan since her Prozac was increased in July 2014.  

## 2013-09-13 NOTE — Assessment & Plan Note (Signed)
Continue to hold Glipizide 5mg  and Metformin 500mg  bid for 2 weeks since her am CBG average is 130s while both of them on hold--may consider diet control her blood sugar since her nausea got better while on hold or may consider insulin. Hgb A1c 6.8 01/17/13 and 6.0 08/15/13

## 2013-09-13 NOTE — Assessment & Plan Note (Signed)
Hgb 11.4 01/03/13--10.8 09/12/13--anemia panel, Guaiac stools x3

## 2013-09-13 NOTE — Assessment & Plan Note (Signed)
Left sided even if muscle strength 4-5/5  

## 2013-09-13 NOTE — Assessment & Plan Note (Signed)
Takes Levothyroxine 50mcg since 03/07/12, TSH 1.973 11/15/12, updated TSH 2.222 04/20/13, 1.386 09/12/13   

## 2013-09-13 NOTE — Assessment & Plan Note (Signed)
Choking episode, diet modified to Nectar thick liquid and Purred diet. Better with Omeprazole 20mg  daily and Carafate 1gm ac and hs.

## 2013-09-14 LAB — BASIC METABOLIC PANEL
BUN: 12 mg/dL (ref 4–21)
Creatinine: 1 mg/dL (ref 0.5–1.1)
GLUCOSE: 106 mg/dL
Potassium: 3.9 mmol/L (ref 3.4–5.3)
SODIUM: 142 mmol/L (ref 137–147)

## 2013-09-14 LAB — CBC AND DIFFERENTIAL
HEMATOCRIT: 35 % — AB (ref 36–46)
Hemoglobin: 12.2 g/dL (ref 12.0–16.0)
PLATELETS: 254 10*3/uL (ref 150–399)
WBC: 11.4 10*3/mL

## 2013-10-16 ENCOUNTER — Non-Acute Institutional Stay (SKILLED_NURSING_FACILITY): Payer: Medicare Other | Admitting: Nurse Practitioner

## 2013-10-16 ENCOUNTER — Encounter: Payer: Self-pay | Admitting: Nurse Practitioner

## 2013-10-16 DIAGNOSIS — E039 Hypothyroidism, unspecified: Secondary | ICD-10-CM

## 2013-10-16 DIAGNOSIS — D649 Anemia, unspecified: Secondary | ICD-10-CM

## 2013-10-16 DIAGNOSIS — K5900 Constipation, unspecified: Secondary | ICD-10-CM

## 2013-10-16 DIAGNOSIS — K2100 Gastro-esophageal reflux disease with esophagitis, without bleeding: Secondary | ICD-10-CM

## 2013-10-16 DIAGNOSIS — R110 Nausea: Secondary | ICD-10-CM

## 2013-10-16 DIAGNOSIS — F329 Major depressive disorder, single episode, unspecified: Secondary | ICD-10-CM

## 2013-10-16 DIAGNOSIS — E119 Type 2 diabetes mellitus without complications: Secondary | ICD-10-CM

## 2013-10-16 DIAGNOSIS — F0390 Unspecified dementia without behavioral disturbance: Secondary | ICD-10-CM

## 2013-10-16 NOTE — Assessment & Plan Note (Signed)
C/o nausea, no vomiting or diarrhea, denied abd, suprapubic, or CVA tenderness. Last BM this am. The patient and her husband stated she has hx of nausea and used to be taking Phenergan frequently. Stable since resumed Glipizide and Metformin, s/p Zofran 4mg  tid for 3 days, also Omeprazole bid and Carafate ac/hs.

## 2013-10-16 NOTE — Assessment & Plan Note (Signed)
Takes Levothyroxine 50mcg since 03/07/12, TSH 1.973 11/15/12, updated TSH 2.222 04/20/13, 1.386 09/12/13   

## 2013-10-16 NOTE — Assessment & Plan Note (Signed)
Hgb 12.2 09/14/13. Normal Fe 40, Folate 3.1, B12 1246. Continue to monitor the patient as well as lab.

## 2013-10-16 NOTE — Progress Notes (Signed)
Patient ID: Sonya Garrett, female   DOB: [birth year], 80 y.o.   MRN: [id number]   Code Status: DNR  Allergies  Allergen Reactions  . Codeine Phosphate     unknown  . Darvocet [Propoxyphene N-Acetaminophen]     unknown  . Ibuprofen     unknown  . Prozac [Fluoxetine Hcl]     unknown  . Reglan [Metoclopramide]     unknown  . Tofranil [Imipramine Hcl]     unknown    Chief Complaint  Patient presents with  . Medical Managment of Chronic Issues    HPI: Patient is a 80 y.o. female seen in the SNF at [facility] today for evaluation of chronic medical conditions.  Problem List Items Addressed This Visit   Diabetes mellitus (Chronic)     Hgb A1c 6.8 01/17/13 and 6.0 08/15/13. Resumed Glipizide and Metformin-no further c/o nausea.                           Unspecified hypothyroidism     Takes Levothyroxine 49mcg since 03/07/12, TSH 1.973 11/15/12, updated TSH 2.222 04/20/13, 1.386 09/12/13                          Anemia, unspecified - Primary     Hgb 12.2 09/14/13. Normal Fe 40, Folate 3.1, B12 1246. Continue to monitor the patient as well as lab.         Relevant Medications      folic acid (FOLVITE) 1 MG tablet   Major depressive disorder, single episode, unspecified     Better with Prozac 40mg  since 05/01/13 and prn Ativan used 3x 07/2013                        Reflux esophagitis     Choking episode, diet modified to Nectar thick liquid and Purred diet. Better with Omeprazole 20mg  daily and Carafate                                                  Unspecified constipation     Stable, continue Miralax daily and Senokot S II qod                        Dementia, in, senility     Stabilized on Namenda and prn Ativan since her Prozac was increased in July 2014.                             Nausea alone     C/o nausea,  no vomiting or diarrhea, denied abd, suprapubic, or CVA tenderness. Last BM this am. The patient and her husband stated she has hx of nausea and used to be taking Phenergan frequently. Stable since resumed Glipizide and Metformin, s/p Zofran 4mg  tid for 3 days, also Omeprazole bid and Carafate ac/hs.            Review of Systems:  Review of Systems  Constitutional: Negative for fever, chills, weight loss, malaise/fatigue and diaphoresis.  HENT: Positive for hearing loss. Negative for congestion, ear pain and sore throat.   Eyes: Negative for pain, discharge and redness.  Respiratory: Positive for cough. Negative for sputum production,  shortness of breath and wheezing.   Cardiovascular: Positive for leg swelling (chronic in ankles, L>R). Negative for chest pain, palpitations, orthopnea, claudication and PND.  Gastrointestinal: Negative for heartburn, nausea, vomiting, abdominal pain, diarrhea and constipation.  Genitourinary: Positive for frequency (chronic). Negative for dysuria, urgency and flank pain.  Musculoskeletal: Positive for back pain, joint pain and myalgias. Negative for neck pain.  Skin: Negative for itching and rash.  Neurological: Positive for focal weakness (left sided weakness grip strength 4-5/5). Negative for dizziness, tingling, tremors, speech change, seizures, loss of consciousness, weakness and headaches.  Endo/Heme/Allergies: Negative for environmental allergies and polydipsia. Does not bruise/bleed easily.  Psychiatric/Behavioral: Positive for depression and memory loss. Negative for hallucinations. The patient is nervous/anxious. The patient does not have insomnia.      Past Medical History  Diagnosis Date  . Thyroid disease hypotyroidism  . Diabetes mellitus without complication   . Anemia   . Depression   . Vitamin D deficiency   . Sleep apnea   . Hypertension   . Osteoarthritis   . GERD (gastroesophageal reflux disease)    Past Surgical History    Procedure Laterality Date  . Brain meningioma excision    . Cholecystectomy    . Tonsillectomy    . Tracheostomy     Social History:   reports that she has never smoked. She has never used smokeless tobacco. She reports that she does not drink alcohol or use illicit drugs.  Family History  Problem Relation Age of Onset  . Diabetes Mother     Medications: Patient's Medications  New Prescriptions   No medications on file  Previous Medications   ACETAMINOPHEN (TYLENOL) 325 MG TABLET    Take 650 mg by mouth every 6 (six) hours as needed for pain.   ALBUTEROL (PROVENTIL) (5 MG/ML) 0.5% NEBULIZER SOLUTION    Take 0.5 mLs (2.5 mg total) by nebulization every 2 (two) hours as needed for wheezing or shortness of breath.   ALBUTEROL (PROVENTIL) (5 MG/ML) 0.5% NEBULIZER SOLUTION    Take 0.5 mLs (2.5 mg total) by nebulization every 6 (six) hours.   CALCIUM CARBONATE (TUMS - DOSED IN MG ELEMENTAL CALCIUM) 500 MG CHEWABLE TABLET    Chew 1 tablet by mouth 2 (two) times daily as needed for heartburn.   CHOLECALCIFEROL (VITAMIN D-3) 1000 UNITS CAPS    Take 1,000 Units by mouth daily.   FLUOXETINE (PROZAC) 40 MG CAPSULE    Take 40 mg by mouth daily.   FOLIC ACID (FOLVITE) 1 MG TABLET    Take 1 mg by mouth daily.   GLIPIZIDE (GLUCOTROL) 5 MG TABLET    Take 5 mg by mouth 2 (two) times daily before a meal.   HYDROXYPROPYL METHYLCELLULOSE (ISOPTO TEARS) 2.5 % OPHTHALMIC SOLUTION    Place 1 drop into both eyes 2 (two) times daily.   IPRATROPIUM (ATROVENT) 0.02 % NEBULIZER SOLUTION    Take 2.5 mLs (0.5 mg total) by nebulization every 6 (six) hours.   LEVOFLOXACIN (LEVAQUIN) 500 MG TABLET    Take 1 tablet (500 mg total) by mouth daily.   LEVOTHYROXINE (SYNTHROID, LEVOTHROID) 50 MCG TABLET    Take 50 mcg by mouth daily. Take 1 tab by mouth every day. * Check pulse weekly on Monday*   LORAZEPAM (ATIVAN) 0.5 MG TABLET    Take 0.5 mg by mouth every 8 (eight) hours as needed for anxiety.   MEMANTINE (NAMENDA) 10  MG TABLET    Take 10 mg by mouth 2 (two)  times daily.   METFORMIN (GLUCOPHAGE) 500 MG TABLET    Take 500 mg by mouth 2 (two) times daily with a meal. Take with food.   NYSTATIN (MYCOSTATIN/NYSTOP) 100000 UNIT/GM POWD    Apply 1 g topically 2 (two) times daily as needed (applied to skin folds twice daily as needed for rash).   NYSTATIN-TRIAMCINOLONE (MYCOLOG II) CREAM    Apply 1 application topically at bedtime. To perineal area   OMEPRAZOLE (PRILOSEC) 20 MG CAPSULE    Take 20 mg by mouth daily.   POLYETHYLENE GLYCOL (MIRALAX / GLYCOLAX) PACKET    Take 17 g by mouth daily. Fill cap to 17 gm mark, mix with 8 ounces of water and take by mouth every day.   PREDNISONE (DELTASONE) 10 MG TABLET    Take 3 tabs daily for 3 days, then 2 tabs daily for 3 days, then 1 tab daily for 3 days, then stop.   PREGABALIN (LYRICA) 75 MG CAPSULE    Take 75 mg by mouth at bedtime.   SACCHAROMYCES BOULARDII (FLORASTOR) 250 MG CAPSULE    Take 250 mg by mouth 2 (two) times daily.   SENNA-DOCUSATE (SENOKOT-S) 8.6-50 MG PER TABLET    Take 2 tablets by mouth every other day.   SUCRALFATE (CARAFATE) 1 G TABLET    Take 1 g by mouth 4 (four) times daily -  with meals and at bedtime.   VITAMIN B-12 (CYANOCOBALAMIN) 1000 MCG TABLET    Take 1,000 mcg by mouth every evening.  Modified Medications   No medications on file  Discontinued Medications   No medications on file     Physical Exam: Physical Exam  Constitutional: She is oriented to person, place, and time. She appears well-developed and well-nourished.  HENT:  Head: Normocephalic and atraumatic.  Eyes: Conjunctivae and EOM are normal. Pupils are equal, round, and reactive to light.  Neck: Normal range of motion. Neck supple. No JVD present. No thyromegaly present.  Cardiovascular: Normal rate, regular rhythm and normal heart sounds.   No murmur heard. Pulmonary/Chest: Effort normal. No respiratory distress. She has no wheezes. She has rales (bibasilar dry rales. ).  She exhibits no tenderness.  Abdominal: Soft. Bowel sounds are normal. She exhibits no distension. There is no tenderness.  Musculoskeletal: Normal range of motion. She exhibits edema (ankle L>R). She exhibits no tenderness.  Lymphadenopathy:    She has no cervical adenopathy.  Neurological: She is alert and oriented to person, place, and time. She has normal reflexes. No cranial nerve deficit. She exhibits abnormal muscle tone (left sided is weaker than the right with muscle strength 5/5). Coordination abnormal.  Left sided weakness even if grip strength 5/5  Skin: No rash noted.  Surgical incision behind left era.  Psychiatric: Thought content normal. Her mood appears anxious. Her affect is angry and inappropriate. Her affect is not blunt and not labile. She is agitated. She is not aggressive, not hyperactive, not slowed, not withdrawn, not actively hallucinating and not combative. Thought content is not paranoid and not delusional. Cognition and memory are impaired. She expresses impulsivity and inappropriate judgment. She does not exhibit a depressed mood. She expresses no homicidal ideation. She expresses no homicidal plans. She exhibits abnormal recent memory. She exhibits normal remote memory. She is attentive.    Filed Vitals:   10/16/13 1558  BP: 118/66  Pulse: 70  Temp: 97.9 F (36.6 C)  TempSrc: Tympanic  Resp: 20      Labs reviewed: Basic Metabolic Panel:  Recent Labs  11/15/12  11/22/12 0600 11/23/12 0547 11/24/12 0528  04/20/13 09/12/13 09/14/13  NA  --   < > 140 138 138  < > 139 137 142  K  --   < > 3.8 3.0* 3.5  < > 4.1 3.4 3.9  CL  --   < > 101 100 101  --   --   --   --   CO2  --   < > 28 26 30   --   --   --   --   GLUCOSE  --   < > 149* 313* 190*  --   --   --   --   BUN  --   < > 9 14 18   < > 10 9 12   CREATININE  --   < > 0.72 0.63 0.77  < > 0.9 0.8 1.0  CALCIUM  --   < > 8.7 8.6 9.0  --   --   --   --   TSH 1.97  --   --   --   --   --  2.22 1.39  --   <  > = values in this interval not displayed. Liver Function Tests:  Recent Labs  11/23/12 0547 11/24/12 0528 11/25/12 0525 04/20/13 09/12/13  AST 24 16 14  11* 9*  ALT 93* 66* 52* 8 8  ALKPHOS 166* 134* 135* 69 68  BILITOT 0.2* 0.2* 0.2*  --   --   PROT 7.1 6.7 6.9  --   --   ALBUMIN 2.9* 2.8* 3.0*  --   --    CBC:  Recent Labs  11/18/12 2321  11/21/12 0551 11/22/12 0600 11/23/12 0547  04/20/13 09/12/13 09/14/13  WBC 12.1*  < > 7.0 6.9 7.3  < > 10.1 9.8 11.4  NEUTROABS 11.4*  --  5.4  --   --   --   --   --   --   HGB 11.0*  < > 10.8* 10.9* 11.2*  < > 11.3* 10.8* 12.2  HCT 32.3*  < > 32.5* 32.8* 33.8*  < > 32* 32* 35*  MCV 83.5  < > 84.0 84.1 82.8  --   --   --   --   PLT 194  < > 182 184 206  < > 208 238 254  < > = values in this interval not displayed.  Assessment/Plan Anemia, unspecified Hgb 12.2 09/14/13. Normal Fe 40, Folate 3.1, B12 1246. Continue to monitor the patient as well as lab.       Major depressive disorder, single episode, unspecified Better with Prozac 40mg  since 05/01/13 and prn Ativan used 3x 07/2013                      Dementia, in, senility Stabilized on Namenda and prn Ativan since her Prozac was increased in July 2014.                           Diabetes mellitus Hgb A1c 6.8 01/17/13 and 6.0 08/15/13. Resumed Glipizide and Metformin-no further c/o nausea.                         Unspecified constipation Stable, continue Miralax daily and Senokot S II qod                      Unspecified hypothyroidism Takes  Levothyroxine 36mcg since 03/07/12, TSH 1.973 11/15/12, updated TSH 2.222 04/20/13, 1.386 09/12/13                        Reflux esophagitis Choking episode, diet modified to Nectar thick liquid and Purred diet. Better with Omeprazole 20mg  daily and  Carafate                                                Nausea alone C/o nausea, no vomiting or diarrhea, denied abd, suprapubic, or CVA tenderness. Last BM this am. The patient and her husband stated she has hx of nausea and used to be taking Phenergan frequently. Stable since resumed Glipizide and Metformin, s/p Zofran 4mg  tid for 3 days, also Omeprazole bid and Carafate ac/hs.         Family/ Staff Communication: observe the patient  Goals of Care: SNF  Labs/tests ordered: none

## 2013-10-16 NOTE — Assessment & Plan Note (Signed)
Stabilized on Namenda and prn Ativan since her Prozac was increased in July 2014.  

## 2013-10-16 NOTE — Assessment & Plan Note (Signed)
Better with Prozac 40mg  since 05/01/13 and prn Ativan used 3x 07/2013

## 2013-10-16 NOTE — Assessment & Plan Note (Signed)
Hgb A1c 6.8 01/17/13 and 6.0 08/15/13. Resumed Glipizide and Metformin-no further c/o nausea.

## 2013-10-16 NOTE — Assessment & Plan Note (Signed)
Stable, continue Miralax daily and Senokot S II qod  

## 2013-10-16 NOTE — Assessment & Plan Note (Signed)
Choking episode, diet modified to Nectar thick liquid and Purred diet. Better with Omeprazole 20mg  daily and Carafate

## 2013-11-27 ENCOUNTER — Encounter: Payer: Self-pay | Admitting: Nurse Practitioner

## 2013-11-27 ENCOUNTER — Non-Acute Institutional Stay (SKILLED_NURSING_FACILITY): Payer: Medicare Other | Admitting: Nurse Practitioner

## 2013-11-27 DIAGNOSIS — R110 Nausea: Secondary | ICD-10-CM

## 2013-11-27 DIAGNOSIS — E039 Hypothyroidism, unspecified: Secondary | ICD-10-CM

## 2013-11-27 DIAGNOSIS — K2100 Gastro-esophageal reflux disease with esophagitis, without bleeding: Secondary | ICD-10-CM

## 2013-11-27 DIAGNOSIS — D649 Anemia, unspecified: Secondary | ICD-10-CM

## 2013-11-27 DIAGNOSIS — R609 Edema, unspecified: Secondary | ICD-10-CM

## 2013-11-27 DIAGNOSIS — F0390 Unspecified dementia without behavioral disturbance: Secondary | ICD-10-CM

## 2013-11-27 DIAGNOSIS — F329 Major depressive disorder, single episode, unspecified: Secondary | ICD-10-CM

## 2013-11-27 DIAGNOSIS — I10 Essential (primary) hypertension: Secondary | ICD-10-CM

## 2013-11-27 DIAGNOSIS — IMO0002 Reserved for concepts with insufficient information to code with codable children: Secondary | ICD-10-CM

## 2013-11-27 DIAGNOSIS — E119 Type 2 diabetes mellitus without complications: Secondary | ICD-10-CM

## 2013-11-27 DIAGNOSIS — K5900 Constipation, unspecified: Secondary | ICD-10-CM

## 2013-11-27 DIAGNOSIS — G8100 Flaccid hemiplegia affecting unspecified side: Secondary | ICD-10-CM

## 2013-11-27 NOTE — Progress Notes (Signed)
Patient ID: Sonya Garrett, female   DOB: [birth year], 80 y.o.   MRN: [id number]   Code Status: DNR  Allergies  Allergen Reactions  . Codeine Phosphate     unknown  . Darvocet [Propoxyphene N-Acetaminophen]     unknown  . Ibuprofen     unknown  . Prozac [Fluoxetine Hcl]     unknown  . Reglan [Metoclopramide]     unknown  . Tofranil [Imipramine Hcl]     unknown    Chief Complaint  Patient presents with  . Medical Managment of Chronic Issues    HPI: Patient is a 80 y.o. female seen in the SNF at [facility] today for evaluation of chronic medical conditions.  Problem List Items Addressed This Visit   Unspecified hypothyroidism     Takes Levothyroxine 27mcg since 03/07/12, TSH 1.973 11/15/12, updated TSH 2.222 04/20/13, 1.386 09/12/13      Unspecified constipation     Stable, continue Miralax daily and Senokot S II qod    Reflux esophagitis     Choking episode, diet modified to Nectar thick liquid and Purred diet. Better with Omeprazole 20mg  daily and Carafate. Less c/o nausea      Neuralgia, neuritis, and radiculitis, unspecified     Pain is managed with Lyrica 75mg       Nausea alone     The patient and her husband stated she has hx of nausea and used to be taking Phenergan frequently. Stable since resumed Glipizide and Metformin, s/p Zofran 4mg  tid for 3 days, also Omeprazole bid and Carafate ac/hs.      Major depressive disorder, single episode, unspecified     etter with Prozac 40mg  since 05/01/13 and prn Ativan       Hypertension     Controlled.      Flaccid hemiplegia affecting nondominant side     Left sided even if muscle strength 4-5/5     Edema     Left ankle> the right, chronic, no significant weight change.       Diabetes mellitus (Chronic)     Hgb A1c 6.8 01/17/13 and 6.0 08/15/13. Resumed Glipizide and Metformin-no further c/o nausea.        Dementia, in, senility - Primary     Stabilized on Namenda and prn Ativan since her  Prozac was increased in July 2014.       Anemia, unspecified     Hgb 12.2 09/14/13. Normal Fe 40, Folate 3.1, B12 1246. Continue to monitor the patient as well as lab.          Review of Systems:  Review of Systems  Constitutional: Negative for fever, chills, weight loss, malaise/fatigue and diaphoresis.  HENT: Positive for hearing loss. Negative for congestion, ear pain and sore throat.   Eyes: Negative for pain, discharge and redness.  Respiratory: Positive for cough. Negative for sputum production, shortness of breath and wheezing.   Cardiovascular: Positive for leg swelling (chronic in ankles, L>R). Negative for chest pain, palpitations, orthopnea, claudication and PND.  Gastrointestinal: Negative for heartburn, nausea, vomiting, abdominal pain, diarrhea and constipation.  Genitourinary: Positive for frequency (chronic). Negative for dysuria, urgency and flank pain.  Musculoskeletal: Positive for back pain, joint pain and myalgias. Negative for neck pain.  Skin: Negative for itching and rash.  Neurological: Positive for focal weakness (left sided weakness grip strength 4-5/5). Negative for dizziness, tingling, tremors, speech change, seizures, loss of consciousness, weakness and headaches.  Endo/Heme/Allergies: Negative for environmental allergies and polydipsia. Does not  bruise/bleed easily.  Psychiatric/Behavioral: Positive for depression and memory loss. Negative for hallucinations. The patient is nervous/anxious. The patient does not have insomnia.      Past Medical History  Diagnosis Date  . Thyroid disease hypotyroidism  . Diabetes mellitus without complication   . Anemia   . Depression   . Vitamin D deficiency   . Sleep apnea   . Hypertension   . Osteoarthritis   . GERD (gastroesophageal reflux disease)    Past Surgical History  Procedure Laterality Date  . Brain meningioma excision    . Cholecystectomy    . Tonsillectomy    . Tracheostomy     Social  History:   reports that she has never smoked. She has never used smokeless tobacco. She reports that she does not drink alcohol or use illicit drugs.  Family History  Problem Relation Age of Onset  . Diabetes Mother     Medications: Patient's Medications  New Prescriptions   No medications on file  Previous Medications   ACETAMINOPHEN (TYLENOL) 325 MG TABLET    Take 650 mg by mouth every 6 (six) hours as needed for pain.   ALBUTEROL (PROVENTIL) (5 MG/ML) 0.5% NEBULIZER SOLUTION    Take 0.5 mLs (2.5 mg total) by nebulization every 2 (two) hours as needed for wheezing or shortness of breath.   ALBUTEROL (PROVENTIL) (5 MG/ML) 0.5% NEBULIZER SOLUTION    Take 0.5 mLs (2.5 mg total) by nebulization every 6 (six) hours.   CALCIUM CARBONATE (TUMS - DOSED IN MG ELEMENTAL CALCIUM) 500 MG CHEWABLE TABLET    Chew 1 tablet by mouth 2 (two) times daily as needed for heartburn.   CHOLECALCIFEROL (VITAMIN D-3) 1000 UNITS CAPS    Take 1,000 Units by mouth daily.   FLUOXETINE (PROZAC) 40 MG CAPSULE    Take 40 mg by mouth daily.   FOLIC ACID (FOLVITE) 1 MG TABLET    Take 1 mg by mouth daily.   GLIPIZIDE (GLUCOTROL) 5 MG TABLET    Take 5 mg by mouth 2 (two) times daily before a meal.   HYDROXYPROPYL METHYLCELLULOSE (ISOPTO TEARS) 2.5 % OPHTHALMIC SOLUTION    Place 1 drop into both eyes 2 (two) times daily.   IPRATROPIUM (ATROVENT) 0.02 % NEBULIZER SOLUTION    Take 2.5 mLs (0.5 mg total) by nebulization every 6 (six) hours.   LEVOFLOXACIN (LEVAQUIN) 500 MG TABLET    Take 1 tablet (500 mg total) by mouth daily.   LEVOTHYROXINE (SYNTHROID, LEVOTHROID) 50 MCG TABLET    Take 50 mcg by mouth daily. Take 1 tab by mouth every day. * Check pulse weekly on Monday*   LORAZEPAM (ATIVAN) 0.5 MG TABLET    Take 0.5 mg by mouth every 8 (eight) hours as needed for anxiety.   MEMANTINE (NAMENDA) 10 MG TABLET    Take 10 mg by mouth 2 (two) times daily.   METFORMIN (GLUCOPHAGE) 500 MG TABLET    Take 500 mg by mouth 2 (two) times  daily with a meal. Take with food.   NYSTATIN (MYCOSTATIN/NYSTOP) 100000 UNIT/GM POWD    Apply 1 g topically 2 (two) times daily as needed (applied to skin folds twice daily as needed for rash).   NYSTATIN-TRIAMCINOLONE (MYCOLOG II) CREAM    Apply 1 application topically at bedtime. To perineal area   OMEPRAZOLE (PRILOSEC) 20 MG CAPSULE    Take 20 mg by mouth daily.   POLYETHYLENE GLYCOL (MIRALAX / GLYCOLAX) PACKET    Take 17 g by mouth daily.  Fill cap to 17 gm mark, mix with 8 ounces of water and take by mouth every day.   PREDNISONE (DELTASONE) 10 MG TABLET    Take 3 tabs daily for 3 days, then 2 tabs daily for 3 days, then 1 tab daily for 3 days, then stop.   PREGABALIN (LYRICA) 75 MG CAPSULE    Take 75 mg by mouth at bedtime.   SACCHAROMYCES BOULARDII (FLORASTOR) 250 MG CAPSULE    Take 250 mg by mouth 2 (two) times daily.   SENNA-DOCUSATE (SENOKOT-S) 8.6-50 MG PER TABLET    Take 2 tablets by mouth every other day.   SUCRALFATE (CARAFATE) 1 G TABLET    Take 1 g by mouth 4 (four) times daily -  with meals and at bedtime.   VITAMIN B-12 (CYANOCOBALAMIN) 1000 MCG TABLET    Take 1,000 mcg by mouth every evening.  Modified Medications   No medications on file  Discontinued Medications   No medications on file     Physical Exam: Physical Exam  Constitutional: She is oriented to person, place, and time. She appears well-developed and well-nourished.  HENT:  Head: Normocephalic and atraumatic.  Eyes: Conjunctivae and EOM are normal. Pupils are equal, round, and reactive to light.  Neck: Normal range of motion. Neck supple. No JVD present. No thyromegaly present.  Cardiovascular: Normal rate, regular rhythm and normal heart sounds.   No murmur heard. Pulmonary/Chest: Effort normal. No respiratory distress. She has no wheezes. She has rales (bibasilar dry rales. ). She exhibits no tenderness.  Abdominal: Soft. Bowel sounds are normal. She exhibits no distension. There is no tenderness.    Musculoskeletal: Normal range of motion. She exhibits edema (ankle L>R). She exhibits no tenderness.  Lymphadenopathy:    She has no cervical adenopathy.  Neurological: She is alert and oriented to person, place, and time. She has normal reflexes. No cranial nerve deficit. She exhibits abnormal muscle tone (left sided is weaker than the right with muscle strength 5/5). Coordination abnormal.  Left sided weakness even if grip strength 5/5  Skin: No rash noted.  Surgical incision behind left era.  Psychiatric: Thought content normal. Her mood appears anxious. Her affect is angry and inappropriate. Her affect is not blunt and not labile. She is agitated. She is not aggressive, not hyperactive, not slowed, not withdrawn, not actively hallucinating and not combative. Thought content is not paranoid and not delusional. Cognition and memory are impaired. She expresses impulsivity and inappropriate judgment. She does not exhibit a depressed mood. She expresses no homicidal ideation. She expresses no homicidal plans. She exhibits abnormal recent memory. She exhibits normal remote memory. She is attentive.    Filed Vitals:   11/27/13 1712  BP: 114/56  Pulse: 62  Temp: 96.9 F (36.1 C)  TempSrc: Tympanic  Resp: 20      Labs reviewed: Basic Metabolic Panel:  Recent Labs  04/20/13 09/12/13 09/14/13  NA 139 137 142  K 4.1 3.4 3.9  BUN 10 9 12   CREATININE 0.9 0.8 1.0  TSH 2.22 1.39  --    Liver Function Tests:  Recent Labs  04/20/13 09/12/13  AST 11* 9*  ALT 8 8  ALKPHOS 69 68   CBC:  Recent Labs  04/20/13 09/12/13 09/14/13  WBC 10.1 9.8 11.4  HGB 11.3* 10.8* 12.2  HCT 32* 32* 35*  PLT 208 238 254    Assessment/Plan Dementia, in, senility Stabilized on Namenda and prn Ativan since her Prozac was increased in July 2014.  Nausea alone The patient and her husband stated she has hx of nausea and used to be taking Phenergan frequently. Stable since resumed Glipizide and  Metformin, s/p Zofran 4mg  tid for 3 days, also Omeprazole bid and Carafate ac/hs.    Flaccid hemiplegia affecting nondominant side Left sided even if muscle strength 4-5/5   Edema Left ankle> the right, chronic, no significant weight change.     Neuralgia, neuritis, and radiculitis, unspecified Pain is managed with Lyrica 75mg     Unspecified constipation Stable, continue Miralax daily and Senokot S II qod  Reflux esophagitis Choking episode, diet modified to Nectar thick liquid and Purred diet. Better with Omeprazole 20mg  daily and Carafate. Less c/o nausea    Major depressive disorder, single episode, unspecified etter with Prozac 40mg  since 05/01/13 and prn Ativan     Anemia, unspecified Hgb 12.2 09/14/13. Normal Fe 40, Folate 3.1, B12 1246. Continue to monitor the patient as well as lab.     Unspecified hypothyroidism Takes Levothyroxine 42mcg since 03/07/12, TSH 1.973 11/15/12, updated TSH 2.222 04/20/13, 1.386 09/12/13    Hypertension Controlled.    Diabetes mellitus Hgb A1c 6.8 01/17/13 and 6.0 08/15/13. Resumed Glipizide and Metformin-no further c/o nausea.        Family/ Staff Communication: observe the patient  Goals of Care: SNF  Labs/tests ordered: none

## 2013-11-28 NOTE — Assessment & Plan Note (Signed)
The patient and her husband stated she has hx of nausea and used to be taking Phenergan frequently. Stable since resumed Glipizide and Metformin, s/p Zofran 4mg  tid for 3 days, also Omeprazole bid and Carafate ac/hs.

## 2013-11-28 NOTE — Assessment & Plan Note (Signed)
Choking episode, diet modified to Nectar thick liquid and Purred diet. Better with Omeprazole 20mg  daily and Carafate. Less c/o nausea

## 2013-11-28 NOTE — Assessment & Plan Note (Signed)
Takes Levothyroxine 50mcg since 03/07/12, TSH 1.973 11/15/12, updated TSH 2.222 04/20/13, 1.386 09/12/13   

## 2013-11-28 NOTE — Assessment & Plan Note (Signed)
Hgb A1c 6.8 01/17/13 and 6.0 08/15/13. Resumed Glipizide and Metformin-no further c/o nausea.     

## 2013-11-28 NOTE — Assessment & Plan Note (Signed)
Stabilized on Namenda and prn Ativan since her Prozac was increased in July 2014.  

## 2013-11-28 NOTE — Assessment & Plan Note (Signed)
Left ankle> the right, chronic, no significant weight change 

## 2013-11-28 NOTE — Assessment & Plan Note (Signed)
Hgb 12.2 09/14/13. Normal Fe 40, Folate 3.1, B12 1246. Continue to monitor the patient as well as lab.      

## 2013-11-28 NOTE — Assessment & Plan Note (Signed)
Stable, continue Miralax daily and Senokot S II qod  

## 2013-11-28 NOTE — Assessment & Plan Note (Signed)
Left sided even if muscle strength 4-5/5

## 2013-11-28 NOTE — Assessment & Plan Note (Signed)
etter with Prozac 40mg  since 05/01/13 and prn Ativan

## 2013-11-28 NOTE — Assessment & Plan Note (Signed)
Pain is managed with Lyrica 75mg 

## 2013-11-28 NOTE — Assessment & Plan Note (Signed)
Controlled.  

## 2013-12-27 ENCOUNTER — Encounter: Payer: Self-pay | Admitting: Nurse Practitioner

## 2013-12-27 ENCOUNTER — Non-Acute Institutional Stay (SKILLED_NURSING_FACILITY): Payer: Medicare Other | Admitting: Nurse Practitioner

## 2013-12-27 DIAGNOSIS — G8100 Flaccid hemiplegia affecting unspecified side: Secondary | ICD-10-CM

## 2013-12-27 DIAGNOSIS — IMO0002 Reserved for concepts with insufficient information to code with codable children: Secondary | ICD-10-CM

## 2013-12-27 DIAGNOSIS — K2100 Gastro-esophageal reflux disease with esophagitis, without bleeding: Secondary | ICD-10-CM

## 2013-12-27 DIAGNOSIS — R609 Edema, unspecified: Secondary | ICD-10-CM

## 2013-12-27 DIAGNOSIS — E119 Type 2 diabetes mellitus without complications: Secondary | ICD-10-CM

## 2013-12-27 DIAGNOSIS — J209 Acute bronchitis, unspecified: Secondary | ICD-10-CM

## 2013-12-27 DIAGNOSIS — I10 Essential (primary) hypertension: Secondary | ICD-10-CM

## 2013-12-27 DIAGNOSIS — K5900 Constipation, unspecified: Secondary | ICD-10-CM

## 2013-12-27 DIAGNOSIS — D649 Anemia, unspecified: Secondary | ICD-10-CM

## 2013-12-27 DIAGNOSIS — E039 Hypothyroidism, unspecified: Secondary | ICD-10-CM

## 2013-12-27 DIAGNOSIS — F329 Major depressive disorder, single episode, unspecified: Secondary | ICD-10-CM

## 2013-12-27 DIAGNOSIS — F0390 Unspecified dementia without behavioral disturbance: Secondary | ICD-10-CM

## 2013-12-27 DIAGNOSIS — R110 Nausea: Secondary | ICD-10-CM

## 2013-12-27 NOTE — Assessment & Plan Note (Signed)
Stable, continue Miralax daily and Senokot S II qod  

## 2013-12-27 NOTE — Assessment & Plan Note (Signed)
Hgb A1c 6.8 01/17/13 and 6.0 08/15/13. Resumed Glipizide and Metformin-no further c/o nausea.

## 2013-12-27 NOTE — Assessment & Plan Note (Signed)
Takes Levothyroxine 50mcg since 03/07/12, TSH 1.973 11/15/12, updated TSH 2.222 04/20/13, 1.386 09/12/13   

## 2013-12-27 NOTE — Assessment & Plan Note (Signed)
Hgb 12.2 09/14/13. Normal Fe 40, Folate 3.1, B12 1246. Continue to monitor the patient as well as lab.

## 2013-12-27 NOTE — Assessment & Plan Note (Signed)
Choking episode, diet modified to Nectar thick liquid and Purred diet. Better with Omeprazole 20mg daily and Carafate. Less c/o nausea   

## 2013-12-27 NOTE — Assessment & Plan Note (Signed)
Left ankle> the right, chronic, no significant weight change 

## 2013-12-27 NOTE — Assessment & Plan Note (Signed)
better with Prozac 40mg  since 05/01/13 and prn Ativan

## 2013-12-27 NOTE — Progress Notes (Signed)
Patient ID: TORYN DEWALT, female   DOB: 1929/07/10, 80 y.o.   MRN: [id number]   Code Status: DNR  Allergies  Allergen Reactions  . Codeine Phosphate     unknown  . Darvocet [Propoxyphene N-Acetaminophen]     unknown  . Ibuprofen     unknown  . Prozac [Fluoxetine Hcl]     unknown  . Reglan [Metoclopramide]     unknown  . Tofranil [Imipramine Hcl]     unknown    Chief Complaint  Patient presents with  . Medical Managment of Chronic Issues  . Acute Visit    congestive cough 2-3 days.   . Dementia    HPI: Patient is a 80 y.o. female seen in the SNF at [facility] today for evaluation of congestive cough and chronic medical conditions.  Problem List Items Addressed This Visit   Diabetes mellitus (Chronic)     Hgb A1c 6.8 01/17/13 and 6.0 08/15/13. Resumed Glipizide and Metformin-no further c/o nausea.        Hypertension     Controlled.      Unspecified hypothyroidism     Takes Levothyroxine 78mcg since 03/07/12, TSH 1.973 11/15/12, updated TSH 2.222 04/20/13, 1.386 09/12/13      Anemia, unspecified     Hgb 12.2 09/14/13. Normal Fe 40, Folate 3.1, B12 1246. Continue to monitor the patient as well as lab.     Major depressive disorder, single episode, unspecified     better with Prozac 40mg  since 05/01/13 and prn Ativan      Reflux esophagitis     Choking episode, diet modified to Nectar thick liquid and Purred diet. Better with Omeprazole 20mg  daily and Carafate. Less c/o nausea      Unspecified constipation     Stable, continue Miralax daily and Senokot S II qod     Neuralgia, neuritis, and radiculitis, unspecified     Pain is managed with Lyrica 75mg      Edema     Left ankle> the right, chronic, no significant weight change.       Flaccid hemiplegia affecting nondominant side     Left sided even if muscle strength 4-5/5      Dementia, in, senility     Stabilized on Namenda and prn Ativan since her Prozac was increased in July 2014.         Nausea alone - Primary     The patient and her husband stated she has hx of nausea and used to be taking Phenergan frequently. Stable on Omeprazole bid and Carafate ac/hs.       Acute bronchitis     Worsened cough 2-3days, denied chest pain or SOB, left ankle trace edema has no change, afebrile, new coarse rhonchi middle lungs, Avelox 400mg  po daily x 10 days along with FloraStor bid. Neb q6hr x 3 days. Mucinex 600mg  bid x 5 days. CXR to r/u PNA, update CBC and CMP.        Review of Systems:  Review of Systems  Constitutional: Negative for fever, chills, weight loss, malaise/fatigue and diaphoresis.  HENT: Positive for hearing loss. Negative for congestion, ear pain and sore throat.   Eyes: Negative for pain, discharge and redness.  Respiratory: Positive for cough. Negative for sputum production, shortness of breath and wheezing.        Congestive cough 2-3 days.   Cardiovascular: Positive for leg swelling (chronic in ankles, L>R). Negative for chest pain, palpitations, orthopnea, claudication and PND.  Gastrointestinal: Negative for  heartburn, nausea, vomiting, abdominal pain, diarrhea and constipation.  Genitourinary: Positive for frequency (chronic). Negative for dysuria, urgency and flank pain.  Musculoskeletal: Positive for back pain, joint pain and myalgias. Negative for neck pain.  Skin: Negative for itching and rash.  Neurological: Positive for focal weakness (left sided weakness grip strength 4-5/5). Negative for dizziness, tingling, tremors, speech change, seizures, loss of consciousness, weakness and headaches.  Endo/Heme/Allergies: Negative for environmental allergies and polydipsia. Does not bruise/bleed easily.  Psychiatric/Behavioral: Positive for depression and memory loss. Negative for hallucinations. The patient is nervous/anxious. The patient does not have insomnia.      Past Medical History  Diagnosis Date  . Thyroid disease hypotyroidism  . Diabetes  mellitus without complication   . Anemia   . Depression   . Vitamin D deficiency   . Sleep apnea   . Hypertension   . Osteoarthritis   . GERD (gastroesophageal reflux disease)    Past Surgical History  Procedure Laterality Date  . Brain meningioma excision    . Cholecystectomy    . Tonsillectomy    . Tracheostomy     Social History:   reports that she has never smoked. She has never used smokeless tobacco. She reports that she does not drink alcohol or use illicit drugs.  Family History  Problem Relation Age of Onset  . Diabetes Mother     Medications: Patient's Medications  New Prescriptions   No medications on file  Previous Medications   ACETAMINOPHEN (TYLENOL) 325 MG TABLET    Take 650 mg by mouth every 6 (six) hours as needed for pain.   ALBUTEROL (PROVENTIL) (5 MG/ML) 0.5% NEBULIZER SOLUTION    Take 0.5 mLs (2.5 mg total) by nebulization every 2 (two) hours as needed for wheezing or shortness of breath.   ALBUTEROL (PROVENTIL) (5 MG/ML) 0.5% NEBULIZER SOLUTION    Take 0.5 mLs (2.5 mg total) by nebulization every 6 (six) hours.   CALCIUM CARBONATE (TUMS - DOSED IN MG ELEMENTAL CALCIUM) 500 MG CHEWABLE TABLET    Chew 1 tablet by mouth 2 (two) times daily as needed for heartburn.   CHOLECALCIFEROL (VITAMIN D-3) 1000 UNITS CAPS    Take 1,000 Units by mouth daily.   FLUOXETINE (PROZAC) 40 MG CAPSULE    Take 40 mg by mouth daily.   FOLIC ACID (FOLVITE) 1 MG TABLET    Take 1 mg by mouth daily.   GLIPIZIDE (GLUCOTROL) 5 MG TABLET    Take 5 mg by mouth 2 (two) times daily before a meal.   HYDROXYPROPYL METHYLCELLULOSE (ISOPTO TEARS) 2.5 % OPHTHALMIC SOLUTION    Place 1 drop into both eyes 2 (two) times daily.   IPRATROPIUM (ATROVENT) 0.02 % NEBULIZER SOLUTION    Take 2.5 mLs (0.5 mg total) by nebulization every 6 (six) hours.   LEVOFLOXACIN (LEVAQUIN) 500 MG TABLET    Take 1 tablet (500 mg total) by mouth daily.   LEVOTHYROXINE (SYNTHROID, LEVOTHROID) 50 MCG TABLET    Take 50 mcg  by mouth daily. Take 1 tab by mouth every day. * Check pulse weekly on Monday*   LORAZEPAM (ATIVAN) 0.5 MG TABLET    Take 0.5 mg by mouth every 8 (eight) hours as needed for anxiety.   MEMANTINE (NAMENDA) 10 MG TABLET    Take 10 mg by mouth 2 (two) times daily.   METFORMIN (GLUCOPHAGE) 500 MG TABLET    Take 500 mg by mouth 2 (two) times daily with a meal. Take with food.   NYSTATIN (  MYCOSTATIN/NYSTOP) 100000 UNIT/GM POWD    Apply 1 g topically 2 (two) times daily as needed (applied to skin folds twice daily as needed for rash).   NYSTATIN-TRIAMCINOLONE (MYCOLOG II) CREAM    Apply 1 application topically at bedtime. To perineal area   OMEPRAZOLE (PRILOSEC) 20 MG CAPSULE    Take 20 mg by mouth daily.   POLYETHYLENE GLYCOL (MIRALAX / GLYCOLAX) PACKET    Take 17 g by mouth daily. Fill cap to 17 gm mark, mix with 8 ounces of water and take by mouth every day.   PREDNISONE (DELTASONE) 10 MG TABLET    Take 3 tabs daily for 3 days, then 2 tabs daily for 3 days, then 1 tab daily for 3 days, then stop.   PREGABALIN (LYRICA) 75 MG CAPSULE    Take 75 mg by mouth at bedtime.   SACCHAROMYCES BOULARDII (FLORASTOR) 250 MG CAPSULE    Take 250 mg by mouth 2 (two) times daily.   SENNA-DOCUSATE (SENOKOT-S) 8.6-50 MG PER TABLET    Take 2 tablets by mouth every other day.   SUCRALFATE (CARAFATE) 1 G TABLET    Take 1 g by mouth 4 (four) times daily -  with meals and at bedtime.   VITAMIN B-12 (CYANOCOBALAMIN) 1000 MCG TABLET    Take 1,000 mcg by mouth every evening.  Modified Medications   No medications on file  Discontinued Medications   No medications on file     Physical Exam: Physical Exam  Constitutional: She is oriented to person, place, and time. She appears well-developed and well-nourished.  HENT:  Head: Normocephalic and atraumatic.  Eyes: Conjunctivae and EOM are normal. Pupils are equal, round, and reactive to light.  Neck: Normal range of motion. Neck supple. No JVD present. No thyromegaly present.   Cardiovascular: Normal rate, regular rhythm and normal heart sounds.   No murmur heard. Pulmonary/Chest: Effort normal. No respiratory distress. She has no wheezes. She has rales (bibasilar dry rales. ). She exhibits no tenderness.  Coarse rhonchi mild lungs-new.   Abdominal: Soft. Bowel sounds are normal. She exhibits no distension. There is no tenderness.  Musculoskeletal: Normal range of motion. She exhibits edema (ankle L>R). She exhibits no tenderness.  Lymphadenopathy:    She has no cervical adenopathy.  Neurological: She is alert and oriented to person, place, and time. She has normal reflexes. No cranial nerve deficit. She exhibits abnormal muscle tone (left sided is weaker than the right with muscle strength 5/5). Coordination abnormal.  Left sided weakness even if grip strength 5/5  Skin: No rash noted.  Surgical incision behind left era.  Psychiatric: Thought content normal. Her mood appears anxious. Her affect is angry and inappropriate. Her affect is not blunt and not labile. She is agitated. She is not aggressive, not hyperactive, not slowed, not withdrawn, not actively hallucinating and not combative. Thought content is not paranoid and not delusional. Cognition and memory are impaired. She expresses impulsivity and inappropriate judgment. She does not exhibit a depressed mood. She expresses no homicidal ideation. She expresses no homicidal plans. She exhibits abnormal recent memory. She exhibits normal remote memory. She is attentive.    Filed Vitals:   12/27/13 1226  BP: 100/60  Pulse: 62  Temp: 96.9 F (36.1 C)  TempSrc: Tympanic  Resp: 18      Labs reviewed: Basic Metabolic Panel:  Recent Labs  04/20/13 09/12/13 09/14/13  NA 139 137 142  K 4.1 3.4 3.9  BUN 10 9 12   CREATININE 0.9  0.8 1.0  TSH 2.22 1.39  --    Liver Function Tests:  Recent Labs  04/20/13 09/12/13  AST 11* 9*  ALT 8 8  ALKPHOS 69 68   CBC:  Recent Labs  04/20/13 09/12/13 09/14/13   WBC 10.1 9.8 11.4  HGB 11.3* 10.8* 12.2  HCT 32* 32* 35*  PLT 208 238 254    Assessment/Plan Nausea alone The patient and her husband stated she has hx of nausea and used to be taking Phenergan frequently. Stable on Omeprazole bid and Carafate ac/hs.     Dementia, in, senility Stabilized on Namenda and prn Ativan since her Prozac was increased in July 2014.      Flaccid hemiplegia affecting nondominant side Left sided even if muscle strength 4-5/5    Edema Left ankle> the right, chronic, no significant weight change.     Neuralgia, neuritis, and radiculitis, unspecified Pain is managed with Lyrica 75mg    Unspecified constipation Stable, continue Miralax daily and Senokot S II qod   Reflux esophagitis Choking episode, diet modified to Nectar thick liquid and Purred diet. Better with Omeprazole 20mg  daily and Carafate. Less c/o nausea    Major depressive disorder, single episode, unspecified better with Prozac 40mg  since 05/01/13 and prn Ativan    Anemia, unspecified Hgb 12.2 09/14/13. Normal Fe 40, Folate 3.1, B12 1246. Continue to monitor the patient as well as lab.   Unspecified hypothyroidism Takes Levothyroxine 78mcg since 03/07/12, TSH 1.973 11/15/12, updated TSH 2.222 04/20/13, 1.386 09/12/13    Hypertension Controlled.    Diabetes mellitus Hgb A1c 6.8 01/17/13 and 6.0 08/15/13. Resumed Glipizide and Metformin-no further c/o nausea.      Acute bronchitis Worsened cough 2-3days, denied chest pain or SOB, left ankle trace edema has no change, afebrile, new coarse rhonchi middle lungs, Avelox 400mg  po daily x 10 days along with FloraStor bid. Neb q6hr x 3 days. Mucinex 600mg  bid x 5 days. CXR to r/u PNA, update CBC and CMP.     Family/ Staff Communication: observe the patient  Goals of Care: SNF  Labs/tests ordered: CBC, CMP, TSH

## 2013-12-27 NOTE — Assessment & Plan Note (Signed)
Controlled.  

## 2013-12-27 NOTE — Assessment & Plan Note (Signed)
Pain is managed with Lyrica 75mg   

## 2013-12-27 NOTE — Assessment & Plan Note (Signed)
Left sided even if muscle strength 4-5/5

## 2013-12-27 NOTE — Assessment & Plan Note (Addendum)
Worsened cough 2-3days, denied chest pain or SOB, left ankle trace edema has no change, afebrile, new coarse rhonchi middle lungs, Avelox 400mg  po daily x 10 days along with FloraStor bid. Neb q6hr x 3 days. Mucinex 600mg  bid x 5 days. CXR to r/u PNA, update CBC and CMP.

## 2013-12-27 NOTE — Assessment & Plan Note (Signed)
Stabilized on Namenda and prn Ativan since her Prozac was increased in July 2014.  

## 2013-12-27 NOTE — Assessment & Plan Note (Signed)
The patient and her husband stated she has hx of nausea and used to be taking Phenergan frequently. Stable on Omeprazole bid and Carafate ac/hs.

## 2013-12-28 LAB — BASIC METABOLIC PANEL
BUN: 11 mg/dL (ref 4–21)
CREATININE: 0.9 mg/dL (ref 0.5–1.1)
GLUCOSE: 62 mg/dL
POTASSIUM: 3.7 mmol/L (ref 3.4–5.3)
Sodium: 137 mmol/L (ref 137–147)

## 2013-12-28 LAB — HEPATIC FUNCTION PANEL
ALT: 8 U/L (ref 7–35)
AST: 9 U/L — AB (ref 13–35)
Alkaline Phosphatase: 70 U/L (ref 25–125)
Bilirubin, Total: 0.1 mg/dL

## 2013-12-28 LAB — CBC AND DIFFERENTIAL
HCT: 28 % — AB (ref 36–46)
Hemoglobin: 9.6 g/dL — AB (ref 12.0–16.0)
PLATELETS: 256 10*3/uL (ref 150–399)
WBC: 11.1 10*3/mL

## 2014-01-01 ENCOUNTER — Non-Acute Institutional Stay (SKILLED_NURSING_FACILITY): Payer: Medicare Other | Admitting: Nurse Practitioner

## 2014-01-01 ENCOUNTER — Encounter: Payer: Self-pay | Admitting: Nurse Practitioner

## 2014-01-01 DIAGNOSIS — K2100 Gastro-esophageal reflux disease with esophagitis, without bleeding: Secondary | ICD-10-CM

## 2014-01-01 DIAGNOSIS — E119 Type 2 diabetes mellitus without complications: Secondary | ICD-10-CM

## 2014-01-01 DIAGNOSIS — F329 Major depressive disorder, single episode, unspecified: Secondary | ICD-10-CM

## 2014-01-01 DIAGNOSIS — J209 Acute bronchitis, unspecified: Secondary | ICD-10-CM

## 2014-01-01 DIAGNOSIS — G8100 Flaccid hemiplegia affecting unspecified side: Secondary | ICD-10-CM

## 2014-01-01 DIAGNOSIS — F0390 Unspecified dementia without behavioral disturbance: Secondary | ICD-10-CM

## 2014-01-01 DIAGNOSIS — D649 Anemia, unspecified: Secondary | ICD-10-CM

## 2014-01-01 DIAGNOSIS — IMO0002 Reserved for concepts with insufficient information to code with codable children: Secondary | ICD-10-CM

## 2014-01-01 DIAGNOSIS — E039 Hypothyroidism, unspecified: Secondary | ICD-10-CM

## 2014-01-01 DIAGNOSIS — R609 Edema, unspecified: Secondary | ICD-10-CM

## 2014-01-01 DIAGNOSIS — I10 Essential (primary) hypertension: Secondary | ICD-10-CM

## 2014-01-01 DIAGNOSIS — K5900 Constipation, unspecified: Secondary | ICD-10-CM

## 2014-01-01 NOTE — Assessment & Plan Note (Signed)
Stable, continue Miralax daily and Senokot S II qod  

## 2014-01-01 NOTE — Assessment & Plan Note (Signed)
Controlled.  

## 2014-01-01 NOTE — Assessment & Plan Note (Signed)
Takes Levothyroxine 50mcg since 03/07/12, TSH 1.973 11/15/12, updated TSH 2.222 04/20/13, 1.386 09/12/13   

## 2014-01-01 NOTE — Assessment & Plan Note (Signed)
Hgb A1c 6.8 01/17/13 and 6.0 08/15/13. Resumed Glipizide and Metformin-no further c/o nausea. Last fasting blood sugar 62 12/28/13   

## 2014-01-01 NOTE — Assessment & Plan Note (Signed)
Left ankle> the right, chronic, no significant weight change 

## 2014-01-01 NOTE — Assessment & Plan Note (Signed)
Left sided even if muscle strength 4-5/5  

## 2014-01-01 NOTE — Assessment & Plan Note (Signed)
gb 12.2 09/14/13. Normal Fe 40, Folate 3.1, B12 1246. Takes Foic acid and Vit B12. Will check Guaiac stools x3, Iron, Fe Sat, TIBC, retic count, Ferritin.

## 2014-01-01 NOTE — Assessment & Plan Note (Signed)
better with Prozac 40mg since 05/01/13 and prn Ativan   

## 2014-01-01 NOTE — Assessment & Plan Note (Signed)
Improved worsened cough 2-3days, denied chest pain or SOB, left ankle trace edema has no change, afebrile, new coarse rhonchi middle lungs-resolved, Avelox 400mg  po daily x 10 days along with FloraStor bid. Neb q6hr x 3 days. Mucinex 600mg  bid x 5 days. CXR 12/27/13 ruled out PNA

## 2014-01-01 NOTE — Assessment & Plan Note (Signed)
Choking episode, diet modified to Nectar thick liquid and Purred diet. Better with Omeprazole 20mg  daily and Carafate. Less c/o nausea

## 2014-01-01 NOTE — Assessment & Plan Note (Signed)
Pain is managed with Lyrica 75mg 

## 2014-01-01 NOTE — Progress Notes (Signed)
Patient ID: Sonya Garrett, female   DOB: 12/01/1928, 80 y.o.   MRN: [id number]   Code Status: DNR  Allergies  Allergen Reactions  . Codeine Phosphate     unknown  . Darvocet [Propoxyphene N-Acetaminophen]     unknown  . Ibuprofen     unknown  . Prozac [Fluoxetine Hcl]     unknown  . Reglan [Metoclopramide]     unknown  . Tofranil [Imipramine Hcl]     unknown    Chief Complaint  Patient presents with  . Medical Managment of Chronic Issues  . Acute Visit  . Anemia    HPI: Patient is a 80 y.o. female seen in the SNF at [facility] today for evaluation of anemia and chronic medical conditions.  Problem List Items Addressed This Visit   Acute bronchitis     Improved worsened cough 2-3days, denied chest pain or SOB, left ankle trace edema has no change, afebrile, new coarse rhonchi middle lungs-resolved, Avelox 400mg  po daily x 10 days along with FloraStor bid. Neb q6hr x 3 days. Mucinex 600mg  bid x 5 days. CXR 12/27/13 ruled out PNA    Anemia, unspecified - Primary     gb 12.2 09/14/13. Normal Fe 40, Folate 3.1, B12 1246. Takes Foic acid and Vit B12. Will check Guaiac stools x3, Iron, Fe Sat, TIBC, retic count, Ferritin.      Dementia, in, senility     Stabilized on Namenda and prn Ativan since her Prozac was increased in July 2014.      Diabetes mellitus (Chronic)     Hgb A1c 6.8 01/17/13 and 6.0 08/15/13. Resumed Glipizide and Metformin-no further c/o nausea. Last fasting blood sugar 62 12/28/13     Edema     Left ankle> the right, chronic, no significant weight change.      Flaccid hemiplegia affecting nondominant side     Left sided even if muscle strength 4-5/5      Hypertension     Controlled.     Major depressive disorder, single episode, unspecified     better with Prozac 40mg  since 05/01/13 and prn Ativan       Neuralgia, neuritis, and radiculitis, unspecified     Pain is managed with Lyrica 75mg       Reflux esophagitis     Choking  episode, diet modified to Nectar thick liquid and Purred diet. Better with Omeprazole 20mg  daily and Carafate. Less c/o nausea     Unspecified constipation     Stable, continue Miralax daily and Senokot S II qod     Unspecified hypothyroidism     Takes Levothyroxine 109mcg since 03/07/12, TSH 1.973 11/15/12, updated TSH 2.222 04/20/13, 1.386 09/12/13        Review of Systems:  Review of Systems  Constitutional: Negative for fever, chills, weight loss, malaise/fatigue and diaphoresis.  HENT: Positive for hearing loss. Negative for congestion, ear pain and sore throat.   Eyes: Negative for pain, discharge and redness.  Respiratory: Positive for cough. Negative for sputum production, shortness of breath and wheezing.        Congestive cough 2-3 days-improving.   Cardiovascular: Positive for leg swelling (chronic in ankles, L>R). Negative for chest pain, palpitations, orthopnea, claudication and PND.  Gastrointestinal: Negative for heartburn, nausea, vomiting, abdominal pain, diarrhea and constipation.  Genitourinary: Positive for frequency (chronic). Negative for dysuria, urgency and flank pain.  Musculoskeletal: Positive for back pain, joint pain and myalgias. Negative for neck pain.  Skin: Negative for itching  and rash.  Neurological: Positive for focal weakness (left sided weakness grip strength 4-5/5). Negative for dizziness, tingling, tremors, speech change, seizures, loss of consciousness, weakness and headaches.  Endo/Heme/Allergies: Negative for environmental allergies and polydipsia. Does not bruise/bleed easily.  Psychiatric/Behavioral: Positive for depression and memory loss. Negative for hallucinations. The patient is nervous/anxious. The patient does not have insomnia.      Past Medical History  Diagnosis Date  . Thyroid disease hypotyroidism  . Diabetes mellitus without complication   . Anemia   . Depression   . Vitamin D deficiency   . Sleep apnea   . Hypertension   .  Osteoarthritis   . GERD (gastroesophageal reflux disease)    Past Surgical History  Procedure Laterality Date  . Brain meningioma excision    . Cholecystectomy    . Tonsillectomy    . Tracheostomy     Social History:   reports that she has never smoked. She has never used smokeless tobacco. She reports that she does not drink alcohol or use illicit drugs.  Family History  Problem Relation Age of Onset  . Diabetes Mother     Medications: Patient's Medications  New Prescriptions   No medications on file  Previous Medications   ACETAMINOPHEN (TYLENOL) 325 MG TABLET    Take 650 mg by mouth every 6 (six) hours as needed for pain.   ALBUTEROL (PROVENTIL) (5 MG/ML) 0.5% NEBULIZER SOLUTION    Take 0.5 mLs (2.5 mg total) by nebulization every 2 (two) hours as needed for wheezing or shortness of breath.   ALBUTEROL (PROVENTIL) (5 MG/ML) 0.5% NEBULIZER SOLUTION    Take 0.5 mLs (2.5 mg total) by nebulization every 6 (six) hours.   CALCIUM CARBONATE (TUMS - DOSED IN MG ELEMENTAL CALCIUM) 500 MG CHEWABLE TABLET    Chew 1 tablet by mouth 2 (two) times daily as needed for heartburn.   CHOLECALCIFEROL (VITAMIN D-3) 1000 UNITS CAPS    Take 1,000 Units by mouth daily.   FLUOXETINE (PROZAC) 40 MG CAPSULE    Take 40 mg by mouth daily.   FOLIC ACID (FOLVITE) 1 MG TABLET    Take 1 mg by mouth daily.   GLIPIZIDE (GLUCOTROL) 5 MG TABLET    Take 5 mg by mouth 2 (two) times daily before a meal.   HYDROXYPROPYL METHYLCELLULOSE (ISOPTO TEARS) 2.5 % OPHTHALMIC SOLUTION    Place 1 drop into both eyes 2 (two) times daily.   IPRATROPIUM (ATROVENT) 0.02 % NEBULIZER SOLUTION    Take 2.5 mLs (0.5 mg total) by nebulization every 6 (six) hours.   LEVOFLOXACIN (LEVAQUIN) 500 MG TABLET    Take 1 tablet (500 mg total) by mouth daily.   LEVOTHYROXINE (SYNTHROID, LEVOTHROID) 50 MCG TABLET    Take 50 mcg by mouth daily. Take 1 tab by mouth every day. * Check pulse weekly on Monday*   LORAZEPAM (ATIVAN) 0.5 MG TABLET    Take  0.5 mg by mouth every 8 (eight) hours as needed for anxiety.   MEMANTINE (NAMENDA) 10 MG TABLET    Take 10 mg by mouth 2 (two) times daily.   METFORMIN (GLUCOPHAGE) 500 MG TABLET    Take 500 mg by mouth 2 (two) times daily with a meal. Take with food.   NYSTATIN (MYCOSTATIN/NYSTOP) 100000 UNIT/GM POWD    Apply 1 g topically 2 (two) times daily as needed (applied to skin folds twice daily as needed for rash).   NYSTATIN-TRIAMCINOLONE (MYCOLOG II) CREAM    Apply 1 application topically  at bedtime. To perineal area   OMEPRAZOLE (PRILOSEC) 20 MG CAPSULE    Take 20 mg by mouth daily.   POLYETHYLENE GLYCOL (MIRALAX / GLYCOLAX) PACKET    Take 17 g by mouth daily. Fill cap to 17 gm mark, mix with 8 ounces of water and take by mouth every day.   PREDNISONE (DELTASONE) 10 MG TABLET    Take 3 tabs daily for 3 days, then 2 tabs daily for 3 days, then 1 tab daily for 3 days, then stop.   PREGABALIN (LYRICA) 75 MG CAPSULE    Take 75 mg by mouth at bedtime.   SACCHAROMYCES BOULARDII (FLORASTOR) 250 MG CAPSULE    Take 250 mg by mouth 2 (two) times daily.   SENNA-DOCUSATE (SENOKOT-S) 8.6-50 MG PER TABLET    Take 2 tablets by mouth every other day.   SUCRALFATE (CARAFATE) 1 G TABLET    Take 1 g by mouth 4 (four) times daily -  with meals and at bedtime.   VITAMIN B-12 (CYANOCOBALAMIN) 1000 MCG TABLET    Take 1,000 mcg by mouth every evening.  Modified Medications   No medications on file  Discontinued Medications   No medications on file     Physical Exam: Physical Exam  Constitutional: She is oriented to person, place, and time. She appears well-developed and well-nourished.  HENT:  Head: Normocephalic and atraumatic.  Eyes: Conjunctivae and EOM are normal. Pupils are equal, round, and reactive to light.  Neck: Normal range of motion. Neck supple. No JVD present. No thyromegaly present.  Cardiovascular: Normal rate, regular rhythm and normal heart sounds.   No murmur heard. Pulmonary/Chest: Effort  normal. No respiratory distress. She has no wheezes. She has rales (bibasilar dry rales. ). She exhibits no tenderness.  Coarse rhonchi mild lungs-new-resolved.  Abdominal: Soft. Bowel sounds are normal. She exhibits no distension. There is no tenderness.  Musculoskeletal: Normal range of motion. She exhibits edema (ankle L>R). She exhibits no tenderness.  Lymphadenopathy:    She has no cervical adenopathy.  Neurological: She is alert and oriented to person, place, and time. She has normal reflexes. No cranial nerve deficit. She exhibits abnormal muscle tone (left sided is weaker than the right with muscle strength 5/5). Coordination abnormal.  Left sided weakness even if grip strength 5/5  Skin: No rash noted.  Surgical incision behind left era.  Psychiatric: Thought content normal. Her mood appears anxious. Her affect is angry and inappropriate. Her affect is not blunt and not labile. She is agitated. She is not aggressive, not hyperactive, not slowed, not withdrawn, not actively hallucinating and not combative. Thought content is not paranoid and not delusional. Cognition and memory are impaired. She expresses impulsivity and inappropriate judgment. She does not exhibit a depressed mood. She expresses no homicidal ideation. She expresses no homicidal plans. She exhibits abnormal recent memory. She exhibits normal remote memory. She is attentive.    Filed Vitals:   01/01/14 1357  Temp: 98.6 F (37 C)  TempSrc: Tympanic   Labs reviewed: Basic Metabolic Panel:  Recent Labs  04/20/13 09/12/13 09/14/13 12/28/13  NA 139 137 142 137  K 4.1 3.4 3.9 3.7  BUN 10 9 12 11   CREATININE 0.9 0.8 1.0 0.9  TSH 2.22 1.39  --   --    Liver Function Tests:  Recent Labs  04/20/13 09/12/13 12/28/13  AST 11* 9* 9*  ALT 8 8 8   ALKPHOS 69 68 70   CBC:  Recent Labs  09/12/13 09/14/13  12/28/13  WBC 9.8 11.4 11.1  HGB 10.8* 12.2 9.6*  HCT 32* 35* 28*  PLT 238 254 256   Past  Procedures:  12/27/13 CXR no cardiomegaly, pulmonary vascular congestion or pleural effusion, patchy left basilar atelectasis or pneumonia unchanged.   Assessment/Plan Anemia, unspecified gb 12.2 09/14/13. Normal Fe 40, Folate 3.1, B12 1246. Takes Foic acid and Vit B12. Will check Guaiac stools x3, Iron, Fe Sat, TIBC, retic count, Ferritin.    Acute bronchitis Improved worsened cough 2-3days, denied chest pain or SOB, left ankle trace edema has no change, afebrile, new coarse rhonchi middle lungs-resolved, Avelox 400mg  po daily x 10 days along with FloraStor bid. Neb q6hr x 3 days. Mucinex 600mg  bid x 5 days. CXR 12/27/13 ruled out PNA  Dementia, in, senility Stabilized on Namenda and prn Ativan since her Prozac was increased in July 2014.    Diabetes mellitus Hgb A1c 6.8 01/17/13 and 6.0 08/15/13. Resumed Glipizide and Metformin-no further c/o nausea. Last fasting blood sugar 62 12/28/13   Edema Left ankle> the right, chronic, no significant weight change.    Flaccid hemiplegia affecting nondominant side Left sided even if muscle strength 4-5/5    Hypertension Controlled.   Major depressive disorder, single episode, unspecified better with Prozac 40mg  since 05/01/13 and prn Ativan     Neuralgia, neuritis, and radiculitis, unspecified Pain is managed with Lyrica 75mg     Reflux esophagitis Choking episode, diet modified to Nectar thick liquid and Purred diet. Better with Omeprazole 20mg  daily and Carafate. Less c/o nausea   Unspecified constipation Stable, continue Miralax daily and Senokot S II qod   Unspecified hypothyroidism Takes Levothyroxine 71mcg since 03/07/12, TSH 1.973 11/15/12, updated TSH 2.222 04/20/13, 1.386 09/12/13     Family/ Staff Communication: observe the patient  Goals of Care: SNF  Labs/tests ordered: anemia panel+Guaiac stools x3

## 2014-01-01 NOTE — Assessment & Plan Note (Signed)
Stabilized on Namenda and prn Ativan since her Prozac was increased in July 2014.  

## 2014-01-02 LAB — CBC AND DIFFERENTIAL
HEMATOCRIT: 32 % — AB (ref 36–46)
HEMOGLOBIN: 10.9 g/dL — AB (ref 12.0–16.0)
PLATELETS: 265 10*3/uL (ref 150–399)
WBC: 11.6 10*3/mL

## 2014-01-22 ENCOUNTER — Non-Acute Institutional Stay (SKILLED_NURSING_FACILITY): Payer: Medicare Other | Admitting: Nurse Practitioner

## 2014-01-22 ENCOUNTER — Encounter: Payer: Self-pay | Admitting: Nurse Practitioner

## 2014-01-22 DIAGNOSIS — F329 Major depressive disorder, single episode, unspecified: Secondary | ICD-10-CM

## 2014-01-22 DIAGNOSIS — K2100 Gastro-esophageal reflux disease with esophagitis, without bleeding: Secondary | ICD-10-CM

## 2014-01-22 DIAGNOSIS — F0390 Unspecified dementia without behavioral disturbance: Secondary | ICD-10-CM

## 2014-01-22 DIAGNOSIS — I10 Essential (primary) hypertension: Secondary | ICD-10-CM

## 2014-01-22 DIAGNOSIS — G8100 Flaccid hemiplegia affecting unspecified side: Secondary | ICD-10-CM

## 2014-01-22 DIAGNOSIS — R609 Edema, unspecified: Secondary | ICD-10-CM

## 2014-01-22 DIAGNOSIS — E119 Type 2 diabetes mellitus without complications: Secondary | ICD-10-CM

## 2014-01-22 DIAGNOSIS — K5900 Constipation, unspecified: Secondary | ICD-10-CM

## 2014-01-22 DIAGNOSIS — J209 Acute bronchitis, unspecified: Secondary | ICD-10-CM

## 2014-01-22 DIAGNOSIS — E039 Hypothyroidism, unspecified: Secondary | ICD-10-CM

## 2014-01-22 DIAGNOSIS — D649 Anemia, unspecified: Secondary | ICD-10-CM

## 2014-01-22 NOTE — Assessment & Plan Note (Signed)
Hgb A1c 6.8 01/17/13 and 6.0 08/15/13. Resumed Glipizide and Metformin-no further c/o nausea. Last fasting blood sugar 62 12/28/13   

## 2014-01-22 NOTE — Assessment & Plan Note (Signed)
hoking episode, diet modified to Nectar thick liquid and Purred diet. Better with Omeprazole 20mg  daily and Carafate. Less c/o nausea

## 2014-01-22 NOTE — Assessment & Plan Note (Signed)
Normal Iron 48 01/02/14. Hgb 10.9 01/02/14. Continue to observe. Continue B12 and Folate.    

## 2014-01-22 NOTE — Assessment & Plan Note (Signed)
Left ankle> the right, chronic, no significant weight change 

## 2014-01-22 NOTE — Progress Notes (Signed)
Patient ID: Sonya Garrett, female   DOB: 1928/11/16, 80 y.o.   MRN: [id number]   Code Status: DNR  Allergies  Allergen Reactions  . Codeine Phosphate     unknown  . Darvocet [Propoxyphene N-Acetaminophen]     unknown  . Ibuprofen     unknown  . Prozac [Fluoxetine Hcl]     unknown  . Reglan [Metoclopramide]     unknown  . Tofranil [Imipramine Hcl]     unknown    Chief Complaint  Patient presents with  . Medical Management of Chronic Issues    HPI: Patient is a 80 y.o. female seen in the SNF at [facility] today for evaluation of chronic medical conditions.  Problem List Items Addressed This Visit   Acute bronchitis     Healed. Completed 10 day course of Avelox.  CXR 12/27/13 ruled out PNA     Anemia, unspecified     Normal Iron 48 01/02/14. Hgb 10.9 01/02/14. Continue to observe. Continue B12 and Folate.     Dementia, in, senility     Stabilized on Namenda and prn Ativan since her Prozac was increased in July 2014.       Diabetes mellitus (Chronic)     Hgb A1c 6.8 01/17/13 and 6.0 08/15/13. Resumed Glipizide and Metformin-no further c/o nausea. Last fasting blood sugar 62 12/28/13     Edema     Left ankle> the right, chronic, no significant weight change.       Flaccid hemiplegia affecting nondominant side     Left sided even if muscle strength 4-5/5     Hypertension - Primary     Controlled.      Major depressive disorder, single episode, unspecified     better with Prozac 40mg  since 05/01/13 and prn Ativan       Reflux esophagitis     hoking episode, diet modified to Nectar thick liquid and Purred diet. Better with Omeprazole 20mg  daily and Carafate. Less c/o nausea     Unspecified constipation     Stable, continue Miralax daily and Senokot S II qod     Unspecified hypothyroidism     Takes Levothyroxine 34mcg since 03/07/12, TSH 1.973 11/15/12, updated TSH 2.222 04/20/13, 1.386 09/12/13         Review of Systems:  Review of Systems    Constitutional: Negative for fever, chills, weight loss, malaise/fatigue and diaphoresis.  HENT: Positive for hearing loss. Negative for congestion, ear pain and sore throat.   Eyes: Negative for pain, discharge and redness.  Respiratory: Negative for cough, sputum production, shortness of breath and wheezing.   Cardiovascular: Positive for leg swelling (chronic in ankles, L>R). Negative for chest pain, palpitations, orthopnea, claudication and PND.  Gastrointestinal: Negative for heartburn, nausea, vomiting, abdominal pain, diarrhea and constipation.  Genitourinary: Positive for frequency (chronic). Negative for dysuria, urgency and flank pain.  Musculoskeletal: Positive for back pain, joint pain and myalgias. Negative for neck pain.  Skin: Negative for itching and rash.  Neurological: Positive for focal weakness (left sided weakness grip strength 4-5/5). Negative for dizziness, tingling, tremors, speech change, seizures, loss of consciousness, weakness and headaches.  Endo/Heme/Allergies: Negative for environmental allergies and polydipsia. Does not bruise/bleed easily.  Psychiatric/Behavioral: Positive for depression and memory loss. Negative for hallucinations. The patient is nervous/anxious. The patient does not have insomnia.      Past Medical History  Diagnosis Date  . Thyroid disease hypotyroidism  . Diabetes mellitus without complication   . Anemia   .  Depression   . Vitamin D deficiency   . Sleep apnea   . Hypertension   . Osteoarthritis   . GERD (gastroesophageal reflux disease)    Past Surgical History  Procedure Laterality Date  . Brain meningioma excision    . Cholecystectomy    . Tonsillectomy    . Tracheostomy     Social History:   reports that she has never smoked. She has never used smokeless tobacco. She reports that she does not drink alcohol or use illicit drugs.  Family History  Problem Relation Age of Onset  . Diabetes Mother      Medications: Patient's Medications  New Prescriptions   No medications on file  Previous Medications   ACETAMINOPHEN (TYLENOL) 325 MG TABLET    Take 650 mg by mouth every 6 (six) hours as needed for pain.   ALBUTEROL (PROVENTIL) (5 MG/ML) 0.5% NEBULIZER SOLUTION    Take 0.5 mLs (2.5 mg total) by nebulization every 2 (two) hours as needed for wheezing or shortness of breath.   ALBUTEROL (PROVENTIL) (5 MG/ML) 0.5% NEBULIZER SOLUTION    Take 0.5 mLs (2.5 mg total) by nebulization every 6 (six) hours.   CALCIUM CARBONATE (TUMS - DOSED IN MG ELEMENTAL CALCIUM) 500 MG CHEWABLE TABLET    Chew 1 tablet by mouth 2 (two) times daily as needed for heartburn.   CHOLECALCIFEROL (VITAMIN D-3) 1000 UNITS CAPS    Take 1,000 Units by mouth daily.   FLUOXETINE (PROZAC) 40 MG CAPSULE    Take 40 mg by mouth daily.   FOLIC ACID (FOLVITE) 1 MG TABLET    Take 1 mg by mouth daily.   GLIPIZIDE (GLUCOTROL) 5 MG TABLET    Take 5 mg by mouth 2 (two) times daily before a meal.   HYDROXYPROPYL METHYLCELLULOSE (ISOPTO TEARS) 2.5 % OPHTHALMIC SOLUTION    Place 1 drop into both eyes 2 (two) times daily.   IPRATROPIUM (ATROVENT) 0.02 % NEBULIZER SOLUTION    Take 2.5 mLs (0.5 mg total) by nebulization every 6 (six) hours.   LEVOFLOXACIN (LEVAQUIN) 500 MG TABLET    Take 1 tablet (500 mg total) by mouth daily.   LEVOTHYROXINE (SYNTHROID, LEVOTHROID) 50 MCG TABLET    Take 50 mcg by mouth daily. Take 1 tab by mouth every day. * Check pulse weekly on Monday*   LORAZEPAM (ATIVAN) 0.5 MG TABLET    Take 0.5 mg by mouth every 8 (eight) hours as needed for anxiety.   MEMANTINE (NAMENDA) 10 MG TABLET    Take 10 mg by mouth 2 (two) times daily.   METFORMIN (GLUCOPHAGE) 500 MG TABLET    Take 500 mg by mouth 2 (two) times daily with a meal. Take with food.   NYSTATIN (MYCOSTATIN/NYSTOP) 100000 UNIT/GM POWD    Apply 1 g topically 2 (two) times daily as needed (applied to skin folds twice daily as needed for rash).   NYSTATIN-TRIAMCINOLONE  (MYCOLOG II) CREAM    Apply 1 application topically at bedtime. To perineal area   OMEPRAZOLE (PRILOSEC) 20 MG CAPSULE    Take 20 mg by mouth daily.   POLYETHYLENE GLYCOL (MIRALAX / GLYCOLAX) PACKET    Take 17 g by mouth daily. Fill cap to 17 gm mark, mix with 8 ounces of water and take by mouth every day.   PREDNISONE (DELTASONE) 10 MG TABLET    Take 3 tabs daily for 3 days, then 2 tabs daily for 3 days, then 1 tab daily for 3 days, then stop.  PREGABALIN (LYRICA) 75 MG CAPSULE    Take 75 mg by mouth at bedtime.   SACCHAROMYCES BOULARDII (FLORASTOR) 250 MG CAPSULE    Take 250 mg by mouth 2 (two) times daily.   SENNA-DOCUSATE (SENOKOT-S) 8.6-50 MG PER TABLET    Take 2 tablets by mouth every other day.   SUCRALFATE (CARAFATE) 1 G TABLET    Take 1 g by mouth 4 (four) times daily -  with meals and at bedtime.   VITAMIN B-12 (CYANOCOBALAMIN) 1000 MCG TABLET    Take 1,000 mcg by mouth every evening.  Modified Medications   No medications on file  Discontinued Medications   No medications on file     Physical Exam: Physical Exam  Constitutional: She is oriented to person, place, and time. She appears well-developed and well-nourished.  HENT:  Head: Normocephalic and atraumatic.  Eyes: Conjunctivae and EOM are normal. Pupils are equal, round, and reactive to light.  Neck: Normal range of motion. Neck supple. No JVD present. No thyromegaly present.  Cardiovascular: Normal rate, regular rhythm and normal heart sounds.   No murmur heard. Pulmonary/Chest: Effort normal. No respiratory distress. She has no wheezes. She has rales (bibasilar dry rales. ). She exhibits no tenderness.  Abdominal: Soft. Bowel sounds are normal. She exhibits no distension. There is no tenderness.  Musculoskeletal: Normal range of motion. She exhibits edema (ankle L>R). She exhibits no tenderness.  Lymphadenopathy:    She has no cervical adenopathy.  Neurological: She is alert and oriented to person, place, and time. She  has normal reflexes. No cranial nerve deficit. She exhibits abnormal muscle tone (left sided is weaker than the right with muscle strength 5/5). Coordination abnormal.  Left sided weakness even if grip strength 5/5  Skin: No rash noted.  Surgical incision behind left era.  Psychiatric: Thought content normal. Her mood appears anxious. Her affect is angry and inappropriate. Her affect is not blunt and not labile. She is agitated. She is not aggressive, not hyperactive, not slowed, not withdrawn, not actively hallucinating and not combative. Thought content is not paranoid and not delusional. Cognition and memory are impaired. She expresses impulsivity and inappropriate judgment. She does not exhibit a depressed mood. She expresses no homicidal ideation. She expresses no homicidal plans. She exhibits abnormal recent memory. She exhibits normal remote memory. She is attentive.    Filed Vitals:   01/22/14 1330  BP: 130/70  Pulse: 69  Temp: 97.4 F (36.3 C)  TempSrc: Tympanic  Resp: 16   Labs reviewed: Basic Metabolic Panel:  Recent Labs  04/20/13 09/12/13 09/14/13 12/28/13  NA 139 137 142 137  K 4.1 3.4 3.9 3.7  BUN 10 9 12 11   CREATININE 0.9 0.8 1.0 0.9  TSH 2.22 1.39  --   --    Liver Function Tests:  Recent Labs  04/20/13 09/12/13 12/28/13  AST 11* 9* 9*  ALT 8 8 8   ALKPHOS 69 68 70   CBC:  Recent Labs  09/14/13 12/28/13 01/02/14  WBC 11.4 11.1 11.6  HGB 12.2 9.6* 10.9*  HCT 35* 28* 32*  PLT 254 256 265   Past Procedures:  12/27/13 CXR no cardiomegaly, pulmonary vascular congestion or pleural effusion, patchy left basilar atelectasis or pneumonia unchanged.   Assessment/Plan Acute bronchitis Healed. Completed 10 day course of Avelox.  CXR 12/27/13 ruled out PNA   Anemia, unspecified Normal Iron 48 01/02/14. Hgb 10.9 01/02/14. Continue to observe. Continue B12 and Folate.   Dementia, in, senility Stabilized on Namenda  and prn Ativan since her Prozac was increased in  July 2014.     Diabetes mellitus Hgb A1c 6.8 01/17/13 and 6.0 08/15/13. Resumed Glipizide and Metformin-no further c/o nausea. Last fasting blood sugar 62 12/28/13   Edema Left ankle> the right, chronic, no significant weight change.     Flaccid hemiplegia affecting nondominant side Left sided even if muscle strength 4-5/5   Hypertension Controlled.    Major depressive disorder, single episode, unspecified better with Prozac 40mg  since 05/01/13 and prn Ativan     Reflux esophagitis hoking episode, diet modified to Nectar thick liquid and Purred diet. Better with Omeprazole 20mg  daily and Carafate. Less c/o nausea   Unspecified constipation Stable, continue Miralax daily and Senokot S II qod   Unspecified hypothyroidism Takes Levothyroxine 28mcg since 03/07/12, TSH 1.973 11/15/12, updated TSH 2.222 04/20/13, 1.386 09/12/13      Family/ Staff Communication: observe the patient  Goals of Care: SNF  Labs/tests ordered: none

## 2014-01-22 NOTE — Assessment & Plan Note (Signed)
Healed. Completed 10 day course of Avelox.  CXR 12/27/13 ruled out PNA

## 2014-01-22 NOTE — Assessment & Plan Note (Signed)
Left sided even if muscle strength 4-5/5  

## 2014-01-22 NOTE — Assessment & Plan Note (Signed)
Takes Levothyroxine 50mcg since 03/07/12, TSH 1.973 11/15/12, updated TSH 2.222 04/20/13, 1.386 09/12/13   

## 2014-01-22 NOTE — Assessment & Plan Note (Signed)
better with Prozac 40mg since 05/01/13 and prn Ativan   

## 2014-01-22 NOTE — Assessment & Plan Note (Signed)
Stable, continue Miralax daily and Senokot S II qod  

## 2014-01-22 NOTE — Assessment & Plan Note (Signed)
Controlled.  

## 2014-01-22 NOTE — Assessment & Plan Note (Signed)
Stabilized on Namenda and prn Ativan since her Prozac was increased in July 2014.  

## 2014-02-08 ENCOUNTER — Other Ambulatory Visit: Payer: Self-pay | Admitting: Nurse Practitioner

## 2014-02-14 ENCOUNTER — Other Ambulatory Visit: Payer: Self-pay | Admitting: Nurse Practitioner

## 2014-02-14 DIAGNOSIS — K2100 Gastro-esophageal reflux disease with esophagitis, without bleeding: Secondary | ICD-10-CM

## 2014-02-14 MED ORDER — SUCRALFATE 1 G PO TABS: 1.0000 g | ORAL_TABLET | Freq: Two times a day (BID) | ORAL | Status: AC

## 2014-02-19 ENCOUNTER — Non-Acute Institutional Stay (SKILLED_NURSING_FACILITY): Payer: Medicare Other | Admitting: Nurse Practitioner

## 2014-02-19 ENCOUNTER — Encounter: Payer: Self-pay | Admitting: Nurse Practitioner

## 2014-02-19 DIAGNOSIS — E119 Type 2 diabetes mellitus without complications: Secondary | ICD-10-CM

## 2014-02-19 DIAGNOSIS — K2100 Gastro-esophageal reflux disease with esophagitis, without bleeding: Secondary | ICD-10-CM

## 2014-02-19 DIAGNOSIS — E039 Hypothyroidism, unspecified: Secondary | ICD-10-CM

## 2014-02-19 DIAGNOSIS — D649 Anemia, unspecified: Secondary | ICD-10-CM

## 2014-02-19 DIAGNOSIS — J209 Acute bronchitis, unspecified: Secondary | ICD-10-CM

## 2014-02-19 DIAGNOSIS — K5900 Constipation, unspecified: Secondary | ICD-10-CM

## 2014-02-19 DIAGNOSIS — F0390 Unspecified dementia without behavioral disturbance: Secondary | ICD-10-CM

## 2014-02-19 DIAGNOSIS — IMO0002 Reserved for concepts with insufficient information to code with codable children: Secondary | ICD-10-CM

## 2014-02-19 DIAGNOSIS — F329 Major depressive disorder, single episode, unspecified: Secondary | ICD-10-CM

## 2014-02-19 NOTE — Assessment & Plan Note (Signed)
Resolved

## 2014-02-19 NOTE — Assessment & Plan Note (Signed)
better with Prozac 40mg since 05/01/13 and prn Ativan 0.5mg q8hr  

## 2014-02-19 NOTE — Assessment & Plan Note (Signed)
Normal Iron 48 01/02/14. Hgb 10.9 01/02/14. Continue to observe. Continue B12 and Folate.    

## 2014-02-19 NOTE — Assessment & Plan Note (Signed)
Stabilized on Namenda and prn Ativan since her Prozac was increased in July 2014.  

## 2014-02-19 NOTE — Assessment & Plan Note (Signed)
diet modified to Nectar thick liquid and Purred diet. Better with Omeprazole 20mg daily and Carafate. Less c/o nausea   

## 2014-02-19 NOTE — Assessment & Plan Note (Signed)
Takes Levothyroxine 50mcg since 03/07/12, TSH 1.973 11/15/12, updated TSH 2.222 04/20/13, 1.386 09/12/13   

## 2014-02-19 NOTE — Assessment & Plan Note (Signed)
Pain is managed with Lyrica 75mg nightly.      

## 2014-02-19 NOTE — Assessment & Plan Note (Signed)
Stable, continue Miralax daily and Senokot S II qod  

## 2014-02-19 NOTE — Progress Notes (Signed)
Patient ID: Sonya Garrett, female   DOB: [birth year], 80 y.o.   MRN: [id number]   Code Status: DNR  Allergies  Allergen Reactions  . Codeine Phosphate     unknown  . Darvocet [Propoxyphene N-Acetaminophen]     unknown  . Ibuprofen     unknown  . Prozac [Fluoxetine Hcl]     unknown  . Reglan [Metoclopramide]     unknown  . Tofranil [Imipramine Hcl]     unknown    Chief Complaint  Patient presents with  . Medical Management of Chronic Issues    HPI: Patient is a 80 y.o. female seen in the SNF at [facility] today for evaluation of chronic medical conditions.  Problem List Items Addressed This Visit   Acute bronchitis - Primary     Resolved.     Anemia, unspecified     Normal Iron 48 01/02/14. Hgb 10.9 01/02/14. Continue to observe. Continue B12 and Folate.      Dementia, in, senility     Stabilized on Namenda and prn Ativan since her Prozac was increased in July 2014.       Diabetes mellitus (Chronic)     Hgb A1c 6.8 01/17/13 and 6.0 08/15/13. Resumed Glipizide and Metformin-no further c/o nausea. Last fasting blood sugar 62 12/28/13      Major depressive disorder, single episode, unspecified     better with Prozac 40mg  since 05/01/13 and prn Ativan 0.5mg  q8hr    Neuralgia, neuritis, and radiculitis, unspecified     Pain is managed with Lyrica 75mg  nightly.        Reflux esophagitis     diet modified to Nectar thick liquid and Purred diet. Better with Omeprazole 20mg  daily and Carafate. Less c/o nausea     Unspecified constipation     Stable, continue Miralax daily and Senokot S II qod      Unspecified hypothyroidism     Takes Levothyroxine 67mcg since 03/07/12, TSH 1.973 11/15/12, updated TSH 2.222 04/20/13, 1.386 09/12/13         Review of Systems:  Review of Systems  Constitutional: Negative for fever, chills, weight loss, malaise/fatigue and diaphoresis.  HENT: Positive for hearing loss. Negative for congestion, ear pain and sore throat.     Eyes: Negative for pain, discharge and redness.  Respiratory: Negative for cough, sputum production, shortness of breath and wheezing.   Cardiovascular: Positive for leg swelling (chronic in ankles, L>R). Negative for chest pain, palpitations, orthopnea, claudication and PND.  Gastrointestinal: Negative for heartburn, nausea, vomiting, abdominal pain, diarrhea and constipation.  Genitourinary: Positive for frequency (chronic). Negative for dysuria, urgency and flank pain.  Musculoskeletal: Positive for back pain, joint pain and myalgias. Negative for neck pain.  Skin: Negative for itching and rash.  Neurological: Positive for focal weakness (left sided weakness grip strength 4-5/5). Negative for dizziness, tingling, tremors, speech change, seizures, loss of consciousness, weakness and headaches.  Endo/Heme/Allergies: Negative for environmental allergies and polydipsia. Does not bruise/bleed easily.  Psychiatric/Behavioral: Positive for depression and memory loss. Negative for hallucinations. The patient is nervous/anxious. The patient does not have insomnia.      Past Medical History  Diagnosis Date  . Thyroid disease hypotyroidism  . Diabetes mellitus without complication   . Anemia   . Depression   . Vitamin D deficiency   . Sleep apnea   . Hypertension   . Osteoarthritis   . GERD (gastroesophageal reflux disease)    Past Surgical History  Procedure Laterality Date  .  Brain meningioma excision    . Cholecystectomy    . Tonsillectomy    . Tracheostomy     Social History:   reports that she has never smoked. She has never used smokeless tobacco. She reports that she does not drink alcohol or use illicit drugs.  Family History  Problem Relation Age of Onset  . Diabetes Mother     Medications: Patient's Medications  New Prescriptions   No medications on file  Previous Medications   ACETAMINOPHEN (TYLENOL) 325 MG TABLET    Take 650 mg by mouth every 6 (six) hours as needed  for pain.   ALBUTEROL (PROVENTIL) (5 MG/ML) 0.5% NEBULIZER SOLUTION    Take 0.5 mLs (2.5 mg total) by nebulization every 2 (two) hours as needed for wheezing or shortness of breath.   ALBUTEROL (PROVENTIL) (5 MG/ML) 0.5% NEBULIZER SOLUTION    Take 0.5 mLs (2.5 mg total) by nebulization every 6 (six) hours.   CALCIUM CARBONATE (TUMS - DOSED IN MG ELEMENTAL CALCIUM) 500 MG CHEWABLE TABLET    Chew 1 tablet by mouth 2 (two) times daily as needed for heartburn.   CHOLECALCIFEROL (VITAMIN D-3) 1000 UNITS CAPS    Take 1,000 Units by mouth daily.   FLUOXETINE (PROZAC) 40 MG CAPSULE    Take 40 mg by mouth daily.   FOLIC ACID (FOLVITE) 1 MG TABLET    Take 1 mg by mouth daily.   GLIPIZIDE (GLUCOTROL) 5 MG TABLET    Take 5 mg by mouth 2 (two) times daily before a meal.   HYDROXYPROPYL METHYLCELLULOSE (ISOPTO TEARS) 2.5 % OPHTHALMIC SOLUTION    Place 1 drop into both eyes 2 (two) times daily.   IPRATROPIUM (ATROVENT) 0.02 % NEBULIZER SOLUTION    Take 2.5 mLs (0.5 mg total) by nebulization every 6 (six) hours.   LEVOFLOXACIN (LEVAQUIN) 500 MG TABLET    Take 1 tablet (500 mg total) by mouth daily.   LEVOTHYROXINE (SYNTHROID, LEVOTHROID) 50 MCG TABLET    Take 50 mcg by mouth daily. Take 1 tab by mouth every day. * Check pulse weekly on Monday*   LORAZEPAM (ATIVAN) 0.5 MG TABLET    Take 0.5 mg by mouth every 8 (eight) hours as needed for anxiety.   MEMANTINE (NAMENDA) 10 MG TABLET    Take 10 mg by mouth 2 (two) times daily.   METFORMIN (GLUCOPHAGE) 500 MG TABLET    Take 500 mg by mouth 2 (two) times daily with a meal. Take with food.   NYSTATIN (MYCOSTATIN/NYSTOP) 100000 UNIT/GM POWD    Apply 1 g topically 2 (two) times daily as needed (applied to skin folds twice daily as needed for rash).   NYSTATIN-TRIAMCINOLONE (MYCOLOG II) CREAM    Apply 1 application topically at bedtime. To perineal area   OMEPRAZOLE (PRILOSEC) 20 MG CAPSULE    Take 20 mg by mouth daily.   POLYETHYLENE GLYCOL (MIRALAX / GLYCOLAX) PACKET     Take 17 g by mouth daily. Fill cap to 17 gm mark, mix with 8 ounces of water and take by mouth every day.   PREDNISONE (DELTASONE) 10 MG TABLET    Take 3 tabs daily for 3 days, then 2 tabs daily for 3 days, then 1 tab daily for 3 days, then stop.   PREGABALIN (LYRICA) 75 MG CAPSULE    Take 75 mg by mouth at bedtime.   SACCHAROMYCES BOULARDII (FLORASTOR) 250 MG CAPSULE    Take 250 mg by mouth 2 (two) times daily.   SENNA-DOCUSATE (  SENOKOT-S) 8.6-50 MG PER TABLET    Take 2 tablets by mouth every other day.   SUCRALFATE (CARAFATE) 1 G TABLET    Take 1 tablet (1 g total) by mouth 2 (two) times daily.   VITAMIN B-12 (CYANOCOBALAMIN) 1000 MCG TABLET    Take 1,000 mcg by mouth every evening.  Modified Medications   No medications on file  Discontinued Medications   No medications on file     Physical Exam: Physical Exam  Constitutional: She is oriented to person, place, and time. She appears well-developed and well-nourished.  HENT:  Head: Normocephalic and atraumatic.  Eyes: Conjunctivae and EOM are normal. Pupils are equal, round, and reactive to light.  Neck: Normal range of motion. Neck supple. No JVD present. No thyromegaly present.  Cardiovascular: Normal rate, regular rhythm and normal heart sounds.   No murmur heard. Pulmonary/Chest: Effort normal. No respiratory distress. She has no wheezes. She has rales (bibasilar dry rales. ). She exhibits no tenderness.  Abdominal: Soft. Bowel sounds are normal. She exhibits no distension. There is no tenderness.  Musculoskeletal: Normal range of motion. She exhibits edema (ankle L>R). She exhibits no tenderness.  Lymphadenopathy:    She has no cervical adenopathy.  Neurological: She is alert and oriented to person, place, and time. She has normal reflexes. No cranial nerve deficit. She exhibits abnormal muscle tone (left sided is weaker than the right with muscle strength 5/5). Coordination abnormal.  Left sided weakness even if grip strength 5/5    Skin: No rash noted.  Surgical incision behind left era.  Psychiatric: Thought content normal. Her mood appears anxious. Her affect is angry and inappropriate. Her affect is not blunt and not labile. She is agitated. She is not aggressive, not hyperactive, not slowed, not withdrawn, not actively hallucinating and not combative. Thought content is not paranoid and not delusional. Cognition and memory are impaired. She expresses impulsivity and inappropriate judgment. She does not exhibit a depressed mood. She expresses no homicidal ideation. She expresses no homicidal plans. She exhibits abnormal recent memory. She exhibits normal remote memory. She is attentive.    Filed Vitals:   02/19/14 1342  BP: 118/70  Pulse: 71  Temp: 97.4 F (36.3 C)  TempSrc: Tympanic  Resp: 17   Labs reviewed: Basic Metabolic Panel:  Recent Labs  04/20/13 09/12/13 09/14/13 12/28/13  NA 139 137 142 137  K 4.1 3.4 3.9 3.7  BUN 10 9 12 11   CREATININE 0.9 0.8 1.0 0.9  TSH 2.22 1.39  --   --    Liver Function Tests:  Recent Labs  04/20/13 09/12/13 12/28/13  AST 11* 9* 9*  ALT 8 8 8   ALKPHOS 69 68 70   CBC:  Recent Labs  09/14/13 12/28/13 01/02/14  WBC 11.4 11.1 11.6  HGB 12.2 9.6* 10.9*  HCT 35* 28* 32*  PLT 254 256 265   Past Procedures:  12/27/13 CXR no cardiomegaly, pulmonary vascular congestion or pleural effusion, patchy left basilar atelectasis or pneumonia unchanged.   Assessment/Plan Acute bronchitis Resolved.   Anemia, unspecified Normal Iron 48 01/02/14. Hgb 10.9 01/02/14. Continue to observe. Continue B12 and Folate.    Neuralgia, neuritis, and radiculitis, unspecified Pain is managed with Lyrica 75mg  nightly.      Diabetes mellitus Hgb A1c 6.8 01/17/13 and 6.0 08/15/13. Resumed Glipizide and Metformin-no further c/o nausea. Last fasting blood sugar 62 12/28/13    Unspecified constipation Stable, continue Miralax daily and Senokot S II qod    Reflux esophagitis  diet  modified to Nectar thick liquid and Purred diet. Better with Omeprazole 20mg  daily and Carafate. Less c/o nausea   Dementia, in, senility Stabilized on Namenda and prn Ativan since her Prozac was increased in July 2014.     Major depressive disorder, single episode, unspecified better with Prozac 40mg  since 05/01/13 and prn Ativan 0.5mg  q8hr  Unspecified hypothyroidism Takes Levothyroxine 74mcg since 03/07/12, TSH 1.973 11/15/12, updated TSH 2.222 04/20/13, 1.386 09/12/13      Family/ Staff Communication: observe the patient  Goals of Care: SNF  Labs/tests ordered: none

## 2014-02-19 NOTE — Assessment & Plan Note (Signed)
Hgb A1c 6.8 01/17/13 and 6.0 08/15/13. Resumed Glipizide and Metformin-no further c/o nausea. Last fasting blood sugar 62 12/28/13   

## 2014-03-16 ENCOUNTER — Other Ambulatory Visit: Payer: Self-pay

## 2014-03-16 MED ORDER — LORAZEPAM 0.5 MG PO TABS: 0.5000 mg | ORAL_TABLET | Freq: Three times a day (TID) | ORAL | Status: DC | PRN

## 2014-03-16 NOTE — Telephone Encounter (Signed)
RX sent to [facility] @ [phone], phone number [phone]. This is a nursing home refill request.   

## 2014-03-16 NOTE — Telephone Encounter (Signed)
Duplicate refill request was received

## 2014-03-19 ENCOUNTER — Non-Acute Institutional Stay (SKILLED_NURSING_FACILITY): Payer: Medicare Other | Admitting: Nurse Practitioner

## 2014-03-19 ENCOUNTER — Encounter: Payer: Self-pay | Admitting: Nurse Practitioner

## 2014-03-19 DIAGNOSIS — K649 Unspecified hemorrhoids: Secondary | ICD-10-CM

## 2014-03-19 DIAGNOSIS — K2100 Gastro-esophageal reflux disease with esophagitis, without bleeding: Secondary | ICD-10-CM

## 2014-03-19 DIAGNOSIS — K5900 Constipation, unspecified: Secondary | ICD-10-CM

## 2014-03-19 DIAGNOSIS — I10 Essential (primary) hypertension: Secondary | ICD-10-CM

## 2014-03-19 DIAGNOSIS — F329 Major depressive disorder, single episode, unspecified: Secondary | ICD-10-CM

## 2014-03-19 DIAGNOSIS — D649 Anemia, unspecified: Secondary | ICD-10-CM

## 2014-03-19 DIAGNOSIS — M1990 Unspecified osteoarthritis, unspecified site: Secondary | ICD-10-CM

## 2014-03-19 DIAGNOSIS — E119 Type 2 diabetes mellitus without complications: Secondary | ICD-10-CM

## 2014-03-19 DIAGNOSIS — F0390 Unspecified dementia without behavioral disturbance: Secondary | ICD-10-CM

## 2014-03-19 DIAGNOSIS — R609 Edema, unspecified: Secondary | ICD-10-CM

## 2014-03-19 DIAGNOSIS — E039 Hypothyroidism, unspecified: Secondary | ICD-10-CM

## 2014-03-19 NOTE — Assessment & Plan Note (Signed)
Hgb A1c 6.8 01/17/13 and 6.0 08/15/13. Resumed Glipizide and Metformin-no further c/o nausea. Last fasting blood sugar 62 12/28/13

## 2014-03-19 NOTE — Assessment & Plan Note (Signed)
Preparation H nightly

## 2014-03-19 NOTE — Assessment & Plan Note (Signed)
Stable, continue Miralax daily and Senokot S II qod  

## 2014-03-19 NOTE — Assessment & Plan Note (Signed)
Takes Levothyroxine 44mcg since 03/07/12, TSH 1.973 11/15/12, updated TSH 2.222 04/20/13, 1.386 09/12/13

## 2014-03-19 NOTE — Assessment & Plan Note (Signed)
diet modified to Nectar thick liquid and Purred diet. Better with Omeprazole 20mg  daily and Carafate. Less c/o nausea

## 2014-03-19 NOTE — Assessment & Plan Note (Signed)
Controlled.  

## 2014-03-19 NOTE — Assessment & Plan Note (Signed)
Normal Iron 48 01/02/14. Hgb 10.9 01/02/14. Continue to observe. Continue B12 and Folate.

## 2014-03-19 NOTE — Assessment & Plan Note (Signed)
Left ankle> the right, chronic, no significant weight change 

## 2014-03-19 NOTE — Assessment & Plan Note (Signed)
Stabilized on Namenda and prn Ativan since her Prozac was increased in July 2014.

## 2014-03-19 NOTE — Assessment & Plan Note (Signed)
better with Prozac 40mg  since 05/01/13 and prn Ativan 0.5mg  q8hr

## 2014-03-19 NOTE — Progress Notes (Signed)
Patient ID: Sonya Garrett, female   DOB: [birth year], 80 y.o.   MRN: [id number]   Code Status: DNR  Allergies  Allergen Reactions  . Codeine Phosphate     unknown  . Darvocet [Propoxyphene N-Acetaminophen]     unknown  . Ibuprofen     unknown  . Prozac [Fluoxetine Hcl]     unknown  . Reglan [Metoclopramide]     unknown  . Tofranil [Imipramine Hcl]     unknown    Chief Complaint  Patient presents with  . Medical Management of Chronic Issues  . Acute Visit    hemorrhoids    HPI: Patient is a 80 y.o. female seen in the SNF at [facility] today for evaluation of chronic medical conditions.  Problem List Items Addressed This Visit   Unspecified hypothyroidism     Takes Levothyroxine 72mcg since 03/07/12, TSH 1.973 11/15/12, updated TSH 2.222 04/20/13, 1.386 09/12/13      Unspecified constipation     Stable, continue Miralax daily and Senokot S II qod      Reflux esophagitis     diet modified to Nectar thick liquid and Purred diet. Better with Omeprazole 20mg  daily and Carafate. Less c/o nausea      Osteoarthrosis, unspecified whether generalized or localized, unspecified site - Primary     Left shoulder reduced ROM, left leg/knee weaker the the right with decreased ROM, pain with weight bearing, multiples steroids inj --no longer effective per Dr. Wynelle Link. Pain is managed with Lyrica 75mg     Major depressive disorder, single episode, unspecified     better with Prozac 40mg  since 05/01/13 and prn Ativan 0.5mg  q8hr     Hypertension     Controlled.      Hemorrhoids     Preparation H nightly     Edema     Left ankle> the right, chronic, no significant weight change.        Diabetes mellitus (Chronic)     Hgb A1c 6.8 01/17/13 and 6.0 08/15/13. Resumed Glipizide and Metformin-no further c/o nausea. Last fasting blood sugar 62 12/28/13      Dementia, in, senility     Stabilized on Namenda and prn Ativan since her Prozac was increased in July 2014.     Anemia, unspecified     Normal Iron 48 01/02/14. Hgb 10.9 01/02/14. Continue to observe. Continue B12 and Folate.          Review of Systems:  Review of Systems  Constitutional: Negative for fever, chills, weight loss, malaise/fatigue and diaphoresis.  HENT: Positive for hearing loss. Negative for congestion, ear pain and sore throat.   Eyes: Negative for pain, discharge and redness.  Respiratory: Negative for cough, sputum production, shortness of breath and wheezing.   Cardiovascular: Positive for leg swelling (chronic in ankles, L>R). Negative for chest pain, palpitations, orthopnea, claudication and PND.  Gastrointestinal: Negative for heartburn, nausea, vomiting, abdominal pain, diarrhea and constipation.       Hemorrhoids-irritated  Genitourinary: Positive for frequency (chronic). Negative for dysuria, urgency and flank pain.  Musculoskeletal: Positive for back pain, joint pain and myalgias. Negative for neck pain.  Skin: Negative for itching and rash.  Neurological: Positive for focal weakness (left sided weakness grip strength 4-5/5). Negative for dizziness, tingling, tremors, speech change, seizures, loss of consciousness, weakness and headaches.  Endo/Heme/Allergies: Negative for environmental allergies and polydipsia. Does not bruise/bleed easily.  Psychiatric/Behavioral: Positive for depression and memory loss. Negative for hallucinations. The patient is nervous/anxious. The patient  does not have insomnia.      Past Medical History  Diagnosis Date  . Thyroid disease hypotyroidism  . Diabetes mellitus without complication   . Anemia   . Depression   . Vitamin D deficiency   . Sleep apnea   . Hypertension   . Osteoarthritis   . GERD (gastroesophageal reflux disease)    Past Surgical History  Procedure Laterality Date  . Brain meningioma excision    . Cholecystectomy    . Tonsillectomy    . Tracheostomy     Social History:   reports that she has never smoked. She  has never used smokeless tobacco. She reports that she does not drink alcohol or use illicit drugs.  Family History  Problem Relation Age of Onset  . Diabetes Mother     Medications: Patient's Medications  New Prescriptions   No medications on file  Previous Medications   ACETAMINOPHEN (TYLENOL) 325 MG TABLET    Take 650 mg by mouth every 6 (six) hours as needed for pain.   ALBUTEROL (PROVENTIL) (5 MG/ML) 0.5% NEBULIZER SOLUTION    Take 0.5 mLs (2.5 mg total) by nebulization every 2 (two) hours as needed for wheezing or shortness of breath.   ALBUTEROL (PROVENTIL) (5 MG/ML) 0.5% NEBULIZER SOLUTION    Take 0.5 mLs (2.5 mg total) by nebulization every 6 (six) hours.   CALCIUM CARBONATE (TUMS - DOSED IN MG ELEMENTAL CALCIUM) 500 MG CHEWABLE TABLET    Chew 1 tablet by mouth 2 (two) times daily as needed for heartburn.   CHOLECALCIFEROL (VITAMIN D-3) 1000 UNITS CAPS    Take 1,000 Units by mouth daily.   FLUOXETINE (PROZAC) 40 MG CAPSULE    Take 40 mg by mouth daily.   FOLIC ACID (FOLVITE) 1 MG TABLET    Take 1 mg by mouth daily.   GLIPIZIDE (GLUCOTROL) 5 MG TABLET    Take 5 mg by mouth 2 (two) times daily before a meal.   HYDROXYPROPYL METHYLCELLULOSE (ISOPTO TEARS) 2.5 % OPHTHALMIC SOLUTION    Place 1 drop into both eyes 2 (two) times daily.   IPRATROPIUM (ATROVENT) 0.02 % NEBULIZER SOLUTION    Take 2.5 mLs (0.5 mg total) by nebulization every 6 (six) hours.   LEVOFLOXACIN (LEVAQUIN) 500 MG TABLET    Take 1 tablet (500 mg total) by mouth daily.   LEVOTHYROXINE (SYNTHROID, LEVOTHROID) 50 MCG TABLET    Take 50 mcg by mouth daily. Take 1 tab by mouth every day. * Check pulse weekly on Monday*   LORAZEPAM (ATIVAN) 0.5 MG TABLET    Take 1 tablet (0.5 mg total) by mouth every 8 (eight) hours as needed for anxiety (For Anxiety).   MEMANTINE (NAMENDA) 10 MG TABLET    Take 10 mg by mouth 2 (two) times daily.   METFORMIN (GLUCOPHAGE) 500 MG TABLET    Take 500 mg by mouth 2 (two) times daily with a meal.  Take with food.   NYSTATIN (MYCOSTATIN/NYSTOP) 100000 UNIT/GM POWD    Apply 1 g topically 2 (two) times daily as needed (applied to skin folds twice daily as needed for rash).   NYSTATIN-TRIAMCINOLONE (MYCOLOG II) CREAM    Apply 1 application topically at bedtime. To perineal area   OMEPRAZOLE (PRILOSEC) 20 MG CAPSULE    Take 20 mg by mouth daily.   POLYETHYLENE GLYCOL (MIRALAX / GLYCOLAX) PACKET    Take 17 g by mouth daily. Fill cap to 17 gm mark, mix with 8 ounces of water and take  by mouth every day.   PREDNISONE (DELTASONE) 10 MG TABLET    Take 3 tabs daily for 3 days, then 2 tabs daily for 3 days, then 1 tab daily for 3 days, then stop.   PREGABALIN (LYRICA) 75 MG CAPSULE    Take 75 mg by mouth at bedtime.   SACCHAROMYCES BOULARDII (FLORASTOR) 250 MG CAPSULE    Take 250 mg by mouth 2 (two) times daily.   SENNA-DOCUSATE (SENOKOT-S) 8.6-50 MG PER TABLET    Take 2 tablets by mouth every other day.   SUCRALFATE (CARAFATE) 1 G TABLET    Take 1 tablet (1 g total) by mouth 2 (two) times daily.   VITAMIN B-12 (CYANOCOBALAMIN) 1000 MCG TABLET    Take 1,000 mcg by mouth every evening.  Modified Medications   No medications on file  Discontinued Medications   No medications on file     Physical Exam: Physical Exam  Constitutional: She is oriented to person, place, and time. She appears well-developed and well-nourished.  HENT:  Head: Normocephalic and atraumatic.  Eyes: Conjunctivae and EOM are normal. Pupils are equal, round, and reactive to light.  Neck: Normal range of motion. Neck supple. No JVD present. No thyromegaly present.  Cardiovascular: Normal rate, regular rhythm and normal heart sounds.   No murmur heard. Pulmonary/Chest: Effort normal. No respiratory distress. She has no wheezes. She has rales (bibasilar dry rales. ). She exhibits no tenderness.  Abdominal: Soft. Bowel sounds are normal. She exhibits no distension. There is no tenderness.  Musculoskeletal: Normal range of  motion. She exhibits edema (ankle L>R). She exhibits no tenderness.  Lymphadenopathy:    She has no cervical adenopathy.  Neurological: She is alert and oriented to person, place, and time. She has normal reflexes. No cranial nerve deficit. She exhibits abnormal muscle tone (left sided is weaker than the right with muscle strength 5/5). Coordination abnormal.  Left sided weakness even if grip strength 5/5  Skin: No rash noted.  Surgical incision behind left ear  Psychiatric: Thought content normal. Her mood appears anxious. Her affect is angry and inappropriate. Her affect is not blunt and not labile. She is agitated. She is not aggressive, not hyperactive, not slowed, not withdrawn, not actively hallucinating and not combative. Thought content is not paranoid and not delusional. Cognition and memory are impaired. She expresses impulsivity and inappropriate judgment. She does not exhibit a depressed mood. She expresses no homicidal ideation. She expresses no homicidal plans. She exhibits abnormal recent memory. She exhibits normal remote memory. She is attentive.    Filed Vitals:   03/19/14 1437  BP: 128/78  Pulse: 86  Temp: 98.2 F (36.8 C)  TempSrc: Tympanic  Resp: 18   Labs reviewed: Basic Metabolic Panel:  Recent Labs  04/20/13 09/12/13 09/14/13 12/28/13  NA 139 137 142 137  K 4.1 3.4 3.9 3.7  BUN 10 9 12 11   CREATININE 0.9 0.8 1.0 0.9  TSH 2.22 1.39  --   --    Liver Function Tests:  Recent Labs  04/20/13 09/12/13 12/28/13  AST 11* 9* 9*  ALT 8 8 8   ALKPHOS 69 68 70   CBC:  Recent Labs  09/14/13 12/28/13 01/02/14  WBC 11.4 11.1 11.6  HGB 12.2 9.6* 10.9*  HCT 35* 28* 32*  PLT 254 256 265   Past Procedures:  12/27/13 CXR no cardiomegaly, pulmonary vascular congestion or pleural effusion, patchy left basilar atelectasis or pneumonia unchanged.   Assessment/Plan Unspecified hypothyroidism Takes Levothyroxine 75mcg since  03/07/12, TSH 1.973 11/15/12, updated TSH 2.222  04/20/13, 1.386 09/12/13    Unspecified constipation Stable, continue Miralax daily and Senokot S II qod    Reflux esophagitis diet modified to Nectar thick liquid and Purred diet. Better with Omeprazole 20mg  daily and Carafate. Less c/o nausea    Osteoarthrosis, unspecified whether generalized or localized, unspecified site Left shoulder reduced ROM, left leg/knee weaker the the right with decreased ROM, pain with weight bearing, multiples steroids inj --no longer effective per Dr. Wynelle Link. Pain is managed with Lyrica 75mg   Major depressive disorder, single episode, unspecified better with Prozac 40mg  since 05/01/13 and prn Ativan 0.5mg  q8hr   Hypertension Controlled.    Edema Left ankle> the right, chronic, no significant weight change.      Diabetes mellitus Hgb A1c 6.8 01/17/13 and 6.0 08/15/13. Resumed Glipizide and Metformin-no further c/o nausea. Last fasting blood sugar 62 12/28/13    Dementia, in, senility Stabilized on Namenda and prn Ativan since her Prozac was increased in July 2014.   Anemia, unspecified Normal Iron 48 01/02/14. Hgb 10.9 01/02/14. Continue to observe. Continue B12 and Folate.     Hemorrhoids Preparation H nightly     Family/ Staff Communication: observe the patient  Goals of Care: SNF  Labs/tests ordered: none

## 2014-03-19 NOTE — Assessment & Plan Note (Signed)
Left shoulder reduced ROM, left leg/knee weaker the the right with decreased ROM, pain with weight bearing, multiples steroids inj --no longer effective per Dr. Wynelle Link. Pain is managed with Lyrica 75mg 

## 2014-03-24 IMAGING — CR DG CHEST 2V
1 series · 1 of 1 positions shown · non-contrast
Comparison: None.

CLINICAL DATA: Fever, nausea and headache; urinary tract infection.
History of diabetes.

CHEST - 2 VIEW

[view not recorded]
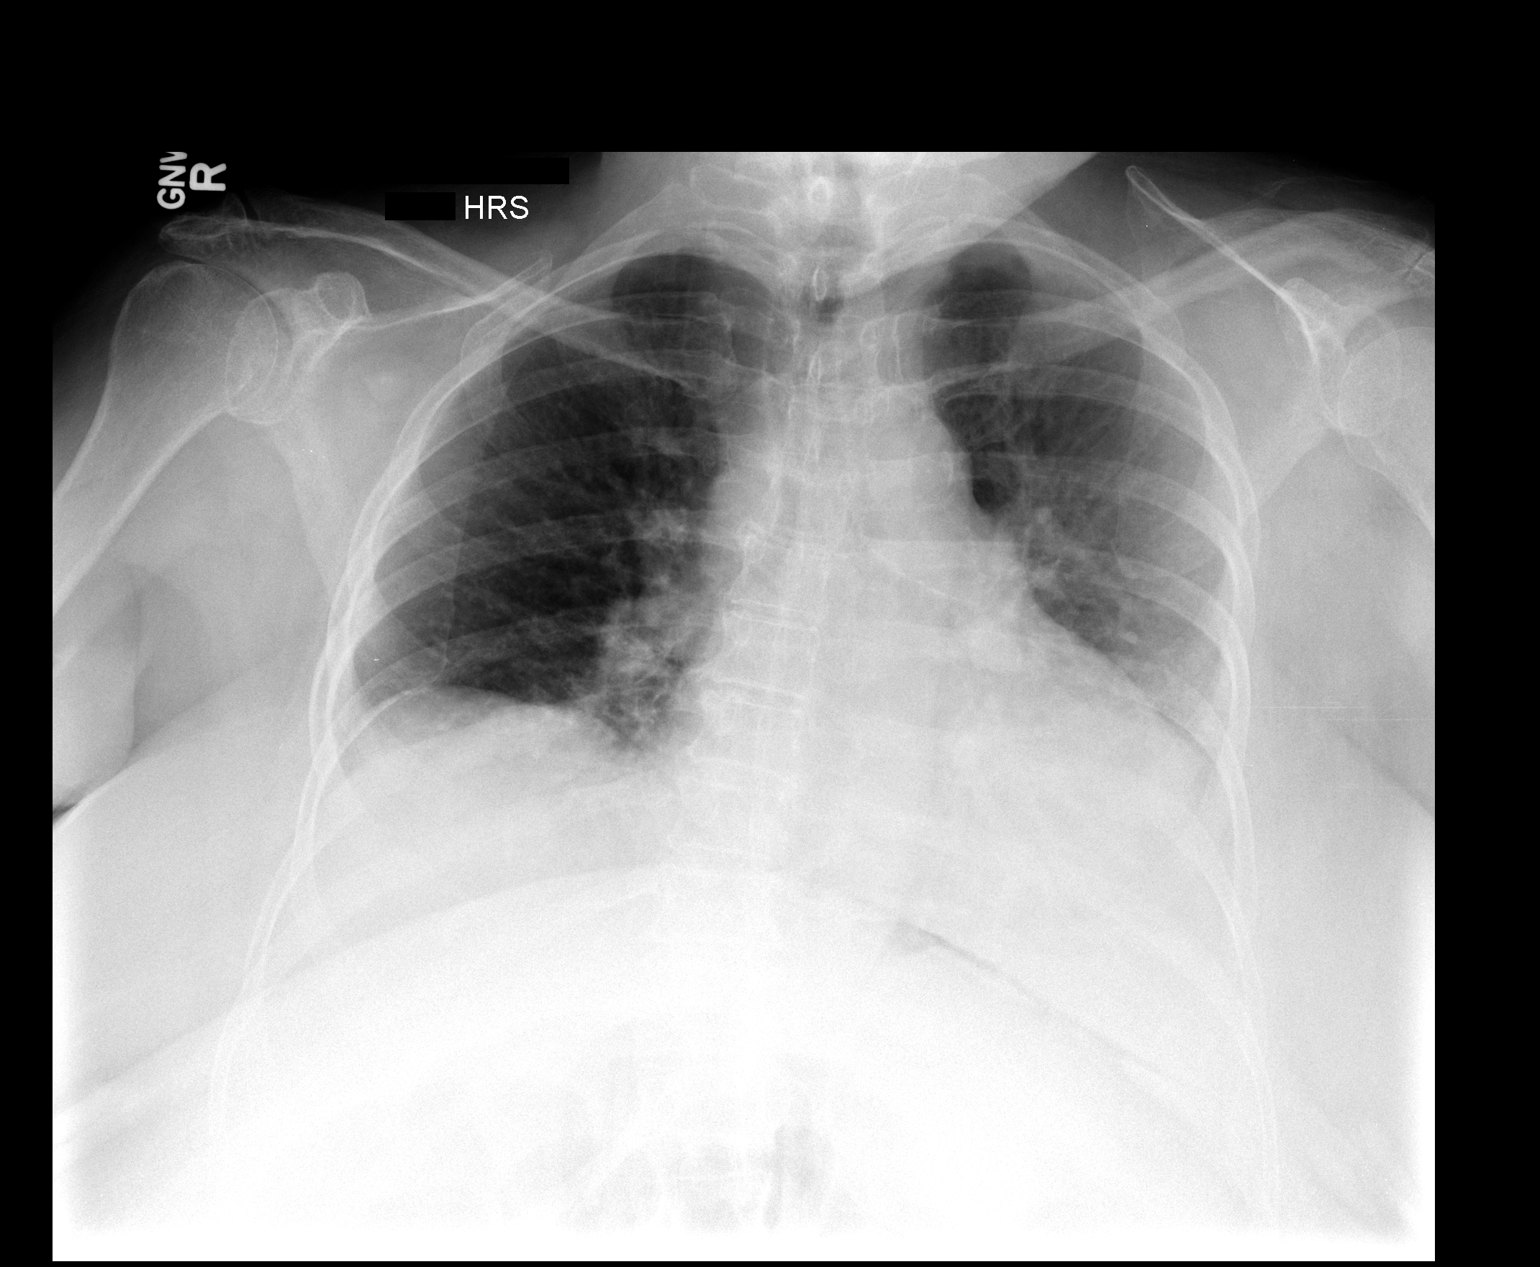

[1 of 1 positions shown; findings below may reference images not displayed]

FINDINGS: The lungs are well expanded.  Left basilar airspace
opacification raises concern for pneumonia.  Mild underlying
vascular congestion is seen.  A small left pleural effusion is
suspected.  No pneumothorax is seen.

The heart is borderline normal in size; calcification is noted
within the aortic arch.  No acute osseous abnormalities are seen.
IMPRESSION: Left basilar airspace opacification raises concern for pneumonia;
suspect small left pleural effusion.  Mild underlying vascular
congestion seen.

## 2014-03-27 IMAGING — CT CT CHEST W/O CM
2 of 4 series · 15 of 36 positions shown, 18 images · non-contrast
Comparison: No priors.

CLINICAL DATA: Shortness of breath.  Wheezing.  Evaluate for
pneumonia.

CT CHEST WITHOUT CONTRAST
TECHNIQUE: Multidetector CT imaging of the chest was performed
following the standard protocol without IV contrast.

[Series 2: routine chest 5.0 st · axial · 0.60mm/px · z∈[-216,+4]mm · 12 of 50 slices shown, 15 images]
[im 3/50  mediastinal]
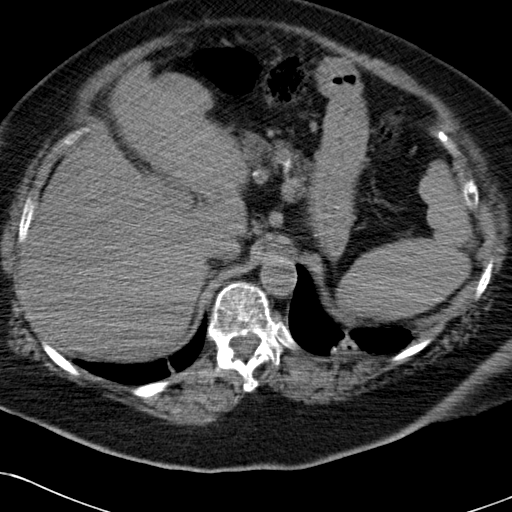
[im 3/50  lung]
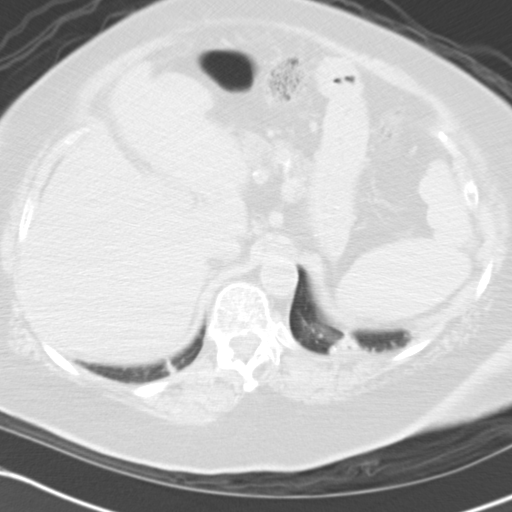
[im 7/50  lung]
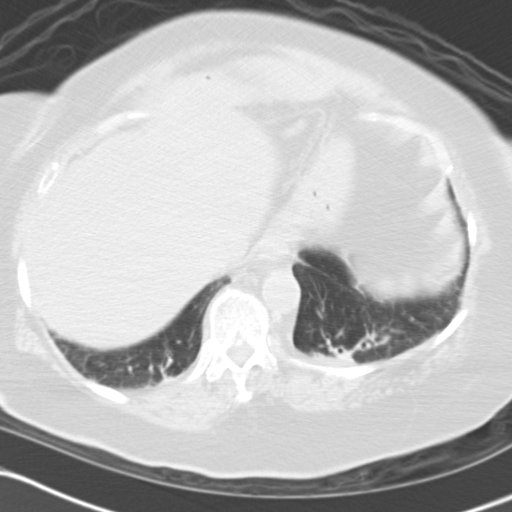
[im 11/50  lung]
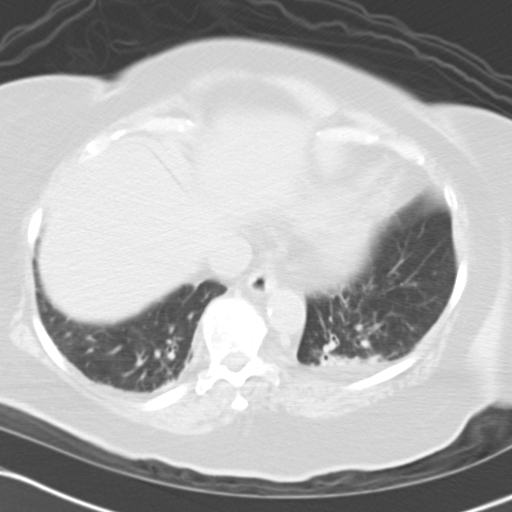
[im 15/50  lung]
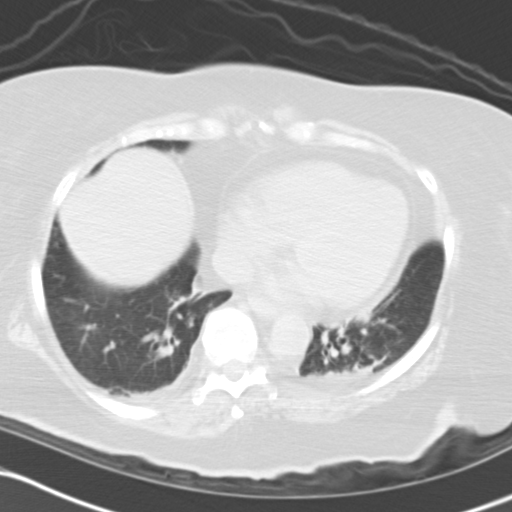
[im 19/50  mediastinal]
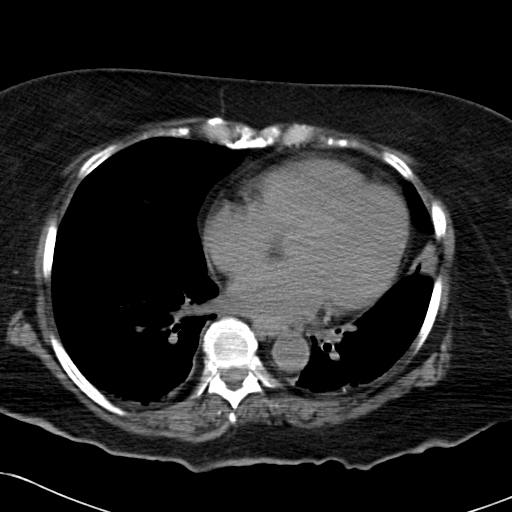
[im 19/50  lung]
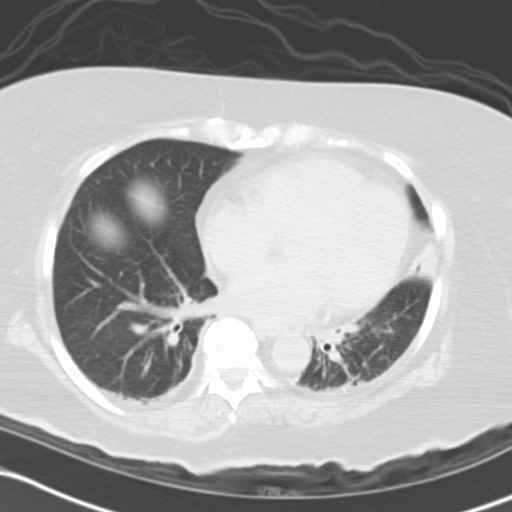
[im 23/50  lung]
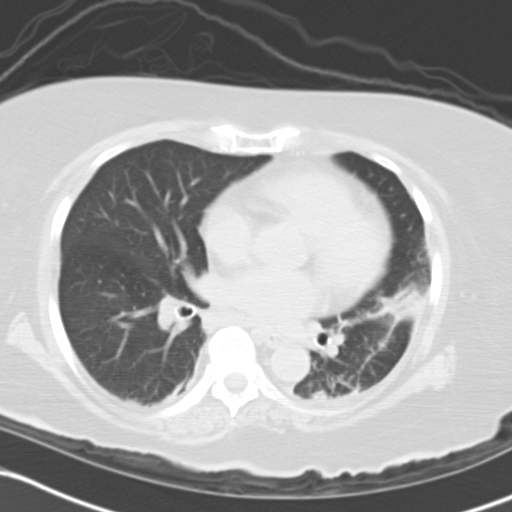
[im 27/50  lung]
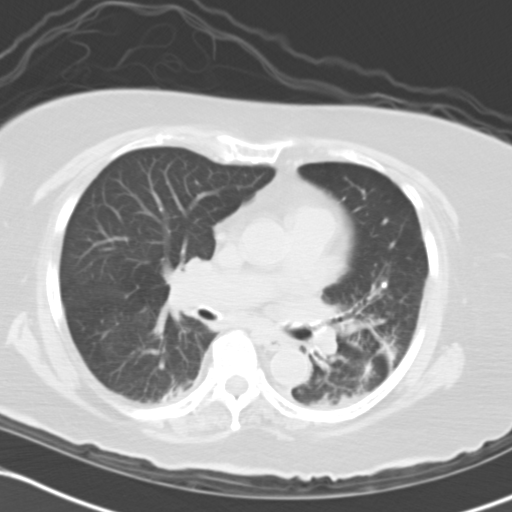
[im 31/50  lung]
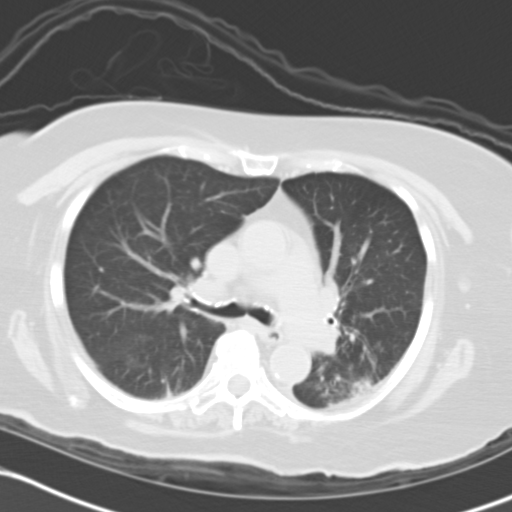
[im 35/50  mediastinal]
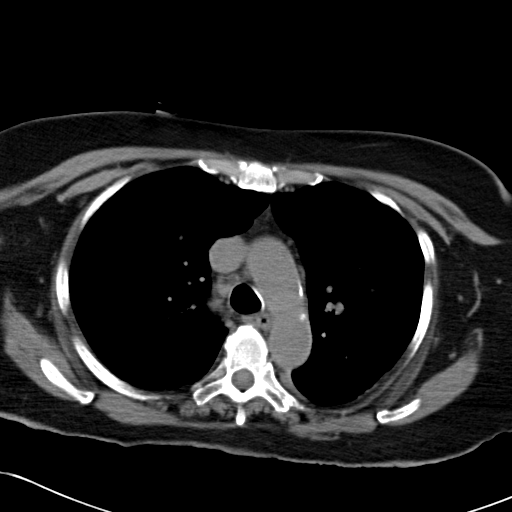
[im 35/50  lung]
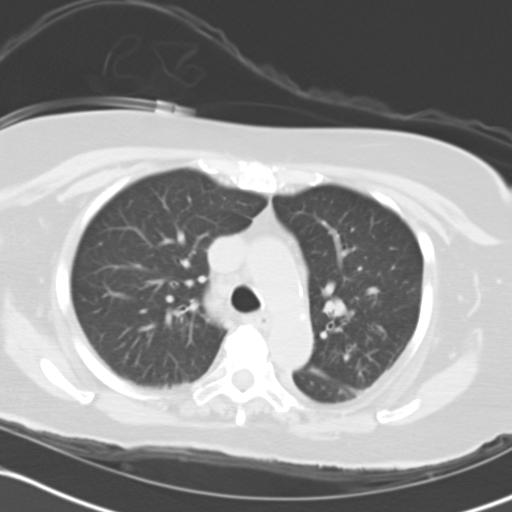
[im 39/50  lung]
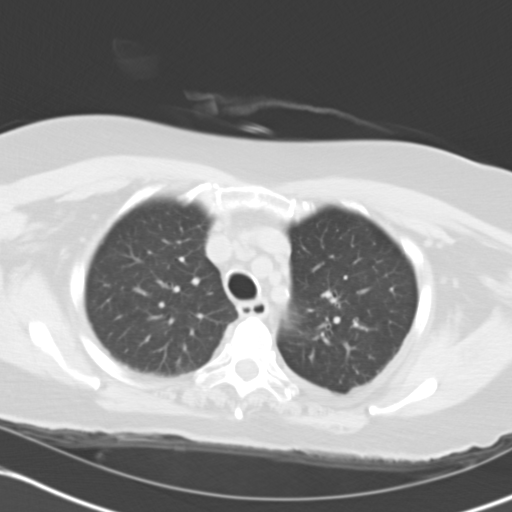
[im 43/50  lung]
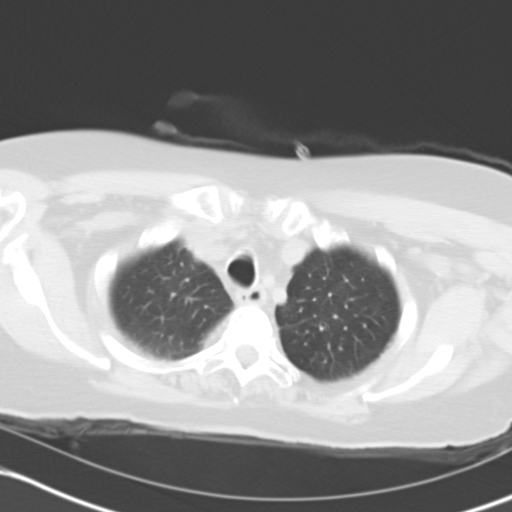
[im 47/50  lung]
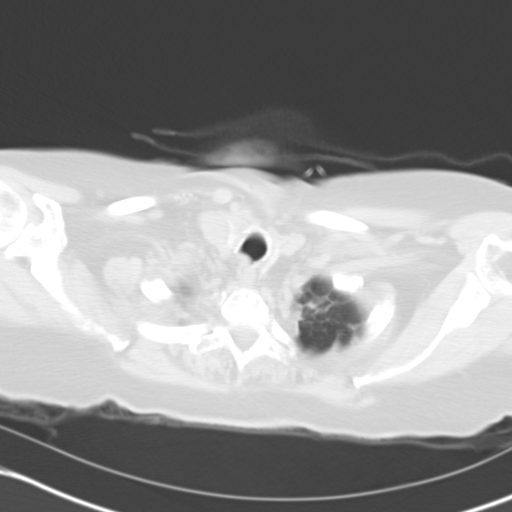

[Series 602: coronals · coronal · 0.60mm/px · 3 of 87 slices shown]
[im 18/87  lung]
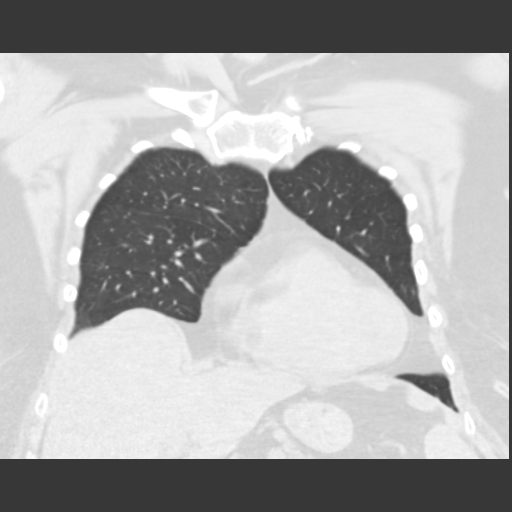
[im 35/87  lung]
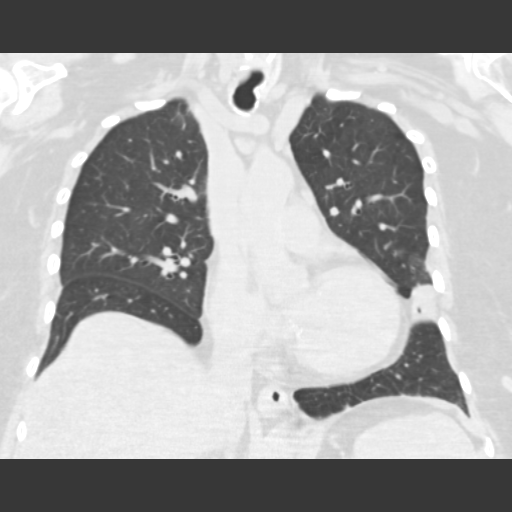
[im 52/87  lung]
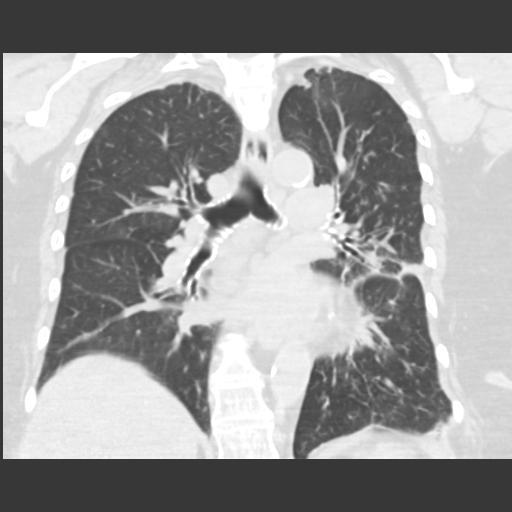

[15 of 36 positions shown; findings below may reference images not displayed]

FINDINGS: Mediastinum: Heart size is borderline enlarged. There is no
significant pericardial fluid, thickening or pericardial
calcification. There is atherosclerosis of the thoracic aorta, the
great vessels of the mediastinum and the coronary arteries,
including calcified atherosclerotic plaque in the left anterior
descending and left circumflex coronary arteries. No pathologically
enlarged mediastinal or hilar lymph nodes. Please note that
accurate exclusion of hilar adenopathy is limited on noncontrast CT
scans.  Multiple borderline enlarged mediastinal lymph nodes are
noted, largest of which measures 9 mm in short axis in the right
paratracheal station.  The esophagus is unremarkable in appearance.

Lungs/Pleura: Throughout the posterior aspect of the left upper
lobe and the entire left lower lobe there is peribronchovascular
interstitial thickening, septal thickening, peribronchovascular
ground-glass attenuation micronodularity, volume loss and extensive
architectural distortion.  Some mild cylindrical bronchiectasis is
also noted throughout these regions, in addition to the inferior
aspect of the right lower lobe.  Overall, the findings are favored
to represent a resolving multilobar bronchopneumonia.  No
significant pleural effusions.

Upper Abdomen: Unremarkable.

Musculoskeletal: There are no aggressive appearing lytic or blastic
lesions noted in the visualized portions of the skeleton.
IMPRESSION: 1.  The findings in the left lung and to a lesser extent in the
posterior aspect of the right lower lobe are favored to reflect a
resolving multilobar bronchopneumonia.
2. Atherosclerosis, including two-vessel coronary artery disease.
Assessment for potential risk factor modification, dietary therapy
or pharmacologic therapy may be warranted, if clinically indicated.

## 2014-03-28 ENCOUNTER — Other Ambulatory Visit: Payer: Self-pay | Admitting: Nurse Practitioner

## 2014-04-16 ENCOUNTER — Encounter: Payer: Self-pay | Admitting: Nurse Practitioner

## 2014-04-16 ENCOUNTER — Non-Acute Institutional Stay (SKILLED_NURSING_FACILITY): Payer: Medicare Other | Admitting: Nurse Practitioner

## 2014-04-16 DIAGNOSIS — E039 Hypothyroidism, unspecified: Secondary | ICD-10-CM

## 2014-04-16 DIAGNOSIS — K5900 Constipation, unspecified: Secondary | ICD-10-CM

## 2014-04-16 DIAGNOSIS — IMO0002 Reserved for concepts with insufficient information to code with codable children: Secondary | ICD-10-CM

## 2014-04-16 DIAGNOSIS — R609 Edema, unspecified: Secondary | ICD-10-CM

## 2014-04-16 DIAGNOSIS — K2100 Gastro-esophageal reflux disease with esophagitis, without bleeding: Secondary | ICD-10-CM

## 2014-04-16 DIAGNOSIS — F0390 Unspecified dementia without behavioral disturbance: Secondary | ICD-10-CM

## 2014-04-16 DIAGNOSIS — F329 Major depressive disorder, single episode, unspecified: Secondary | ICD-10-CM

## 2014-04-16 NOTE — Assessment & Plan Note (Signed)
diet modified to Nectar thick liquid and Purred diet. Better with Omeprazole 20mg  daily and Carafate. Less c/o nausea

## 2014-04-16 NOTE — Assessment & Plan Note (Signed)
Hgb A1c 6.8 01/17/13 and 6.0 08/15/13. Resumed Glipizide and Metformin-no further c/o nausea. Last fasting blood sugar 62 12/28/13

## 2014-04-16 NOTE — Assessment & Plan Note (Signed)
Left ankle> the right, chronic, no significant weight change 

## 2014-04-16 NOTE — Progress Notes (Signed)
Patient ID: Sonya Garrett, female   DOB: 10-16-1928, 80 y.o.   MRN: [id number]   Code Status: DNR  Allergies  Allergen Reactions  . Codeine Phosphate     unknown  . Darvocet [Propoxyphene N-Acetaminophen]     unknown  . Ibuprofen     unknown  . Prozac [Fluoxetine Hcl]     unknown  . Reglan [Metoclopramide]     unknown  . Tofranil [Imipramine Hcl]     unknown    Chief Complaint  Patient presents with  . Medical Management of Chronic Issues    HPI: Patient is a 80 y.o. female seen in the SNF at [facility] today for evaluation of chronic medical conditions.  Problem List Items Addressed This Visit   Unspecified hypothyroidism     Takes Levothyroxine 52mcg since 03/07/12, TSH 1.973 11/15/12, updated TSH 2.222 04/20/13, 1.386 09/12/13      Major depressive disorder, single episode, unspecified - Primary     better with Prozac 40mg  since 05/01/13 and off prn Ativan 0.5mg  q8hr      Reflux esophagitis     diet modified to Nectar thick liquid and Purred diet. Better with Omeprazole 20mg  daily and Carafate. Less c/o nausea      Unspecified constipation     Stable, continue Miralax daily and Senokot S II qod     Neuralgia, neuritis, and radiculitis, unspecified     Pain is managed with Lyrica 75mg  nightly.         Edema     Left ankle> the right, chronic, no significant weight change.       Dementia, in, senility     Stabilized on Namenda         Review of Systems:  Review of Systems  Constitutional: Negative for fever, chills, weight loss, malaise/fatigue and diaphoresis.  HENT: Positive for hearing loss. Negative for congestion, ear pain and sore throat.   Eyes: Negative for pain, discharge and redness.  Respiratory: Negative for cough, sputum production, shortness of breath and wheezing.   Cardiovascular: Positive for leg swelling (chronic in ankles, L>R). Negative for chest pain, palpitations, orthopnea, claudication and PND.  Gastrointestinal:  Negative for heartburn, nausea, vomiting, abdominal pain, diarrhea and constipation.       Hemorrhoids-irritated  Genitourinary: Positive for frequency (chronic). Negative for dysuria, urgency and flank pain.  Musculoskeletal: Positive for back pain, joint pain and myalgias. Negative for neck pain.  Skin: Negative for itching and rash.  Neurological: Positive for focal weakness (left sided weakness grip strength 4-5/5). Negative for dizziness, tingling, tremors, speech change, seizures, loss of consciousness, weakness and headaches.  Endo/Heme/Allergies: Negative for environmental allergies and polydipsia. Does not bruise/bleed easily.  Psychiatric/Behavioral: Positive for depression and memory loss. Negative for hallucinations. The patient is nervous/anxious. The patient does not have insomnia.      Past Medical History  Diagnosis Date  . Thyroid disease hypotyroidism  . Diabetes mellitus without complication   . Anemia   . Depression   . Vitamin D deficiency   . Sleep apnea   . Hypertension   . Osteoarthritis   . GERD (gastroesophageal reflux disease)    Past Surgical History  Procedure Laterality Date  . Brain meningioma excision    . Cholecystectomy    . Tonsillectomy    . Tracheostomy     Social History:   reports that she has never smoked. She has never used smokeless tobacco. She reports that she does not drink alcohol or use illicit  drugs.  Family History  Problem Relation Age of Onset  . Diabetes Mother     Medications: Patient's Medications  New Prescriptions   No medications on file  Previous Medications   ACETAMINOPHEN (TYLENOL) 325 MG TABLET    Take 650 mg by mouth every 6 (six) hours as needed for pain.   ALBUTEROL (PROVENTIL) (5 MG/ML) 0.5% NEBULIZER SOLUTION    Take 0.5 mLs (2.5 mg total) by nebulization every 2 (two) hours as needed for wheezing or shortness of breath.   ALBUTEROL (PROVENTIL) (5 MG/ML) 0.5% NEBULIZER SOLUTION    Take 0.5 mLs (2.5 mg  total) by nebulization every 6 (six) hours.   CALCIUM CARBONATE (TUMS - DOSED IN MG ELEMENTAL CALCIUM) 500 MG CHEWABLE TABLET    Chew 1 tablet by mouth 2 (two) times daily as needed for heartburn.   CHOLECALCIFEROL (VITAMIN D-3) 1000 UNITS CAPS    Take 1,000 Units by mouth daily.   FLUOXETINE (PROZAC) 40 MG CAPSULE    Take 40 mg by mouth daily.   FOLIC ACID (FOLVITE) 1 MG TABLET    Take 1 mg by mouth daily.   GLIPIZIDE (GLUCOTROL) 5 MG TABLET    Take 5 mg by mouth 2 (two) times daily before a meal.   HYDROXYPROPYL METHYLCELLULOSE (ISOPTO TEARS) 2.5 % OPHTHALMIC SOLUTION    Place 1 drop into both eyes 2 (two) times daily.   IPRATROPIUM (ATROVENT) 0.02 % NEBULIZER SOLUTION    Take 2.5 mLs (0.5 mg total) by nebulization every 6 (six) hours.   LEVOFLOXACIN (LEVAQUIN) 500 MG TABLET    Take 1 tablet (500 mg total) by mouth daily.   LEVOTHYROXINE (SYNTHROID, LEVOTHROID) 50 MCG TABLET    Take 50 mcg by mouth daily. Take 1 tab by mouth every day. * Check pulse weekly on Monday*   MEMANTINE (NAMENDA) 10 MG TABLET    Take 10 mg by mouth 2 (two) times daily.   METFORMIN (GLUCOPHAGE) 500 MG TABLET    Take 500 mg by mouth 2 (two) times daily with a meal. Take with food.   NYSTATIN (MYCOSTATIN/NYSTOP) 100000 UNIT/GM POWD    Apply 1 g topically 2 (two) times daily as needed (applied to skin folds twice daily as needed for rash).   NYSTATIN-TRIAMCINOLONE (MYCOLOG II) CREAM    Apply 1 application topically at bedtime. To perineal area   OMEPRAZOLE (PRILOSEC) 20 MG CAPSULE    Take 20 mg by mouth daily.   POLYETHYLENE GLYCOL (MIRALAX / GLYCOLAX) PACKET    Take 17 g by mouth daily. Fill cap to 17 gm mark, mix with 8 ounces of water and take by mouth every day.   PREDNISONE (DELTASONE) 10 MG TABLET    Take 3 tabs daily for 3 days, then 2 tabs daily for 3 days, then 1 tab daily for 3 days, then stop.   PREGABALIN (LYRICA) 75 MG CAPSULE    Take 75 mg by mouth at bedtime.   SACCHAROMYCES BOULARDII (FLORASTOR) 250 MG  CAPSULE    Take 250 mg by mouth 2 (two) times daily.   SENNA-DOCUSATE (SENOKOT-S) 8.6-50 MG PER TABLET    Take 2 tablets by mouth every other day.   SUCRALFATE (CARAFATE) 1 G TABLET    Take 1 tablet (1 g total) by mouth 2 (two) times daily.   VITAMIN B-12 (CYANOCOBALAMIN) 1000 MCG TABLET    Take 1,000 mcg by mouth every evening.  Modified Medications   No medications on file  Discontinued Medications   No medications  on file     Physical Exam: Physical Exam  Constitutional: She is oriented to person, place, and time. She appears well-developed and well-nourished.  HENT:  Head: Normocephalic and atraumatic.  Eyes: Conjunctivae and EOM are normal. Pupils are equal, round, and reactive to light.  Neck: Normal range of motion. Neck supple. No JVD present. No thyromegaly present.  Cardiovascular: Normal rate, regular rhythm and normal heart sounds.   No murmur heard. Pulmonary/Chest: Effort normal. No respiratory distress. She has no wheezes. She has rales (bibasilar dry rales. ). She exhibits no tenderness.  Abdominal: Soft. Bowel sounds are normal. She exhibits no distension. There is no tenderness.  Musculoskeletal: Normal range of motion. She exhibits edema (ankle L>R). She exhibits no tenderness.  Lymphadenopathy:    She has no cervical adenopathy.  Neurological: She is alert and oriented to person, place, and time. She has normal reflexes. No cranial nerve deficit. She exhibits abnormal muscle tone (left sided is weaker than the right with muscle strength 5/5). Coordination abnormal.  Left sided weakness even if grip strength 5/5  Skin: No rash noted.  Surgical incision behind left ear  Psychiatric: Thought content normal. Her mood appears anxious. Her affect is angry and inappropriate. Her affect is not blunt and not labile. She is agitated. She is not aggressive, not hyperactive, not slowed, not withdrawn, not actively hallucinating and not combative. Thought content is not paranoid  and not delusional. Cognition and memory are impaired. She expresses impulsivity and inappropriate judgment. She does not exhibit a depressed mood. She expresses no homicidal ideation. She expresses no homicidal plans. She exhibits abnormal recent memory. She exhibits normal remote memory. She is attentive.    Filed Vitals:   04/16/14 1740  BP: 138/76  Pulse: 64  Temp: 98.6 F (37 C)  TempSrc: Tympanic  Resp: 18   Labs reviewed: Basic Metabolic Panel:  Recent Labs  04/20/13 09/12/13 09/14/13 12/28/13  NA 139 137 142 137  K 4.1 3.4 3.9 3.7  BUN 10 9 12 11   CREATININE 0.9 0.8 1.0 0.9  TSH 2.22 1.39  --   --    Liver Function Tests:  Recent Labs  04/20/13 09/12/13 12/28/13  AST 11* 9* 9*  ALT 8 8 8   ALKPHOS 69 68 70   CBC:  Recent Labs  09/14/13 12/28/13 01/02/14  WBC 11.4 11.1 11.6  HGB 12.2 9.6* 10.9*  HCT 35* 28* 32*  PLT 254 256 265   Past Procedures:  12/27/13 CXR no cardiomegaly, pulmonary vascular congestion or pleural effusion, patchy left basilar atelectasis or pneumonia unchanged.   Assessment/Plan Diabetes mellitus Hgb A1c 6.8 01/17/13 and 6.0 08/15/13. Resumed Glipizide and Metformin-no further c/o nausea. Last fasting blood sugar 62 12/28/13     Major depressive disorder, single episode, unspecified better with Prozac 40mg  since 05/01/13 and off prn Ativan 0.5mg  q8hr    Neuralgia, neuritis, and radiculitis, unspecified Pain is managed with Lyrica 75mg  nightly.       Unspecified constipation Stable, continue Miralax daily and Senokot S II qod   Reflux esophagitis diet modified to Nectar thick liquid and Purred diet. Better with Omeprazole 20mg  daily and Carafate. Less c/o nausea    Edema Left ankle> the right, chronic, no significant weight change.     Unspecified hypothyroidism Takes Levothyroxine 42mcg since 03/07/12, TSH 1.973 11/15/12, updated TSH 2.222 04/20/13, 1.386 09/12/13    Dementia, in, senility Stabilized on Namenda       Family/ Staff Communication: observe the patient  Goals  of Care: SNF  Labs/tests ordered: none

## 2014-04-16 NOTE — Assessment & Plan Note (Signed)
better with Prozac 40mg since 05/01/13 and off prn Ativan 0.5mg q8hr     

## 2014-04-16 NOTE — Assessment & Plan Note (Signed)
Takes Levothyroxine 51mcg since 03/07/12, TSH 1.973 11/15/12, updated TSH 2.222 04/20/13, 1.386 09/12/13

## 2014-04-16 NOTE — Assessment & Plan Note (Signed)
Stable, continue Miralax daily and Senokot S II qod  

## 2014-04-16 NOTE — Assessment & Plan Note (Signed)
Stabilized on Namenda   

## 2014-04-16 NOTE — Assessment & Plan Note (Signed)
Pain is managed with Lyrica 75mg  nightly.

## 2014-05-01 ENCOUNTER — Encounter: Payer: Self-pay | Admitting: *Deleted

## 2014-05-14 ENCOUNTER — Encounter: Payer: Self-pay | Admitting: Nurse Practitioner

## 2014-05-14 ENCOUNTER — Non-Acute Institutional Stay (SKILLED_NURSING_FACILITY): Payer: Medicare Other | Admitting: Nurse Practitioner

## 2014-05-14 DIAGNOSIS — IMO0002 Reserved for concepts with insufficient information to code with codable children: Secondary | ICD-10-CM

## 2014-05-14 DIAGNOSIS — K2100 Gastro-esophageal reflux disease with esophagitis, without bleeding: Secondary | ICD-10-CM

## 2014-05-14 DIAGNOSIS — K5900 Constipation, unspecified: Secondary | ICD-10-CM

## 2014-05-14 DIAGNOSIS — F329 Major depressive disorder, single episode, unspecified: Secondary | ICD-10-CM

## 2014-05-14 DIAGNOSIS — E039 Hypothyroidism, unspecified: Secondary | ICD-10-CM

## 2014-05-14 DIAGNOSIS — F0390 Unspecified dementia without behavioral disturbance: Secondary | ICD-10-CM

## 2014-05-14 DIAGNOSIS — E119 Type 2 diabetes mellitus without complications: Secondary | ICD-10-CM

## 2014-05-14 NOTE — Progress Notes (Signed)
Patient ID: Sonya Garrett, female   DOB: [birth year], 80 y.o.   MRN: [id number]   Code Status: DNR  Allergies  Allergen Reactions  . Codeine Phosphate     unknown  . Darvocet [Propoxyphene N-Acetaminophen]     unknown  . Ibuprofen     unknown  . Prozac [Fluoxetine Hcl]     unknown  . Reglan [Metoclopramide]     unknown  . Tofranil [Imipramine Hcl]     unknown    Chief Complaint  Patient presents with  . Medical Management of Chronic Issues    HPI: Patient is a 80 y.o. female seen in the SNF at [facility] today for evaluation of chronic medical conditions.  Problem List Items Addressed This Visit   Diabetes mellitus - Primary (Chronic)     Controlled, Hgb A1c 6.0 08/15/13. Continue Metformin 500mg  bid.     Unspecified hypothyroidism     Takes Levothyroxine 67mcg since 03/07/12, TSH 1.973 11/15/12, updated TSH 2.222 04/20/13, 1.386 09/12/13       Major depressive disorder, single episode, unspecified     better with Prozac 40mg  since 05/01/13 and off prn Ativan 0.5mg  q8hr      Reflux esophagitis     diet modified to Nectar thick liquid and Purred diet. Better with Omeprazole 20mg  daily and Carafate. Less c/o nausea      Unspecified constipation     Stable, continue Miralax daily and Senokot S II qod      Neuralgia, neuritis, and radiculitis, unspecified     Pain is managed with Lyrica 75mg  nightly.       Dementia, in, senility     Stabilized on Namenda          Review of Systems:  Review of Systems  Constitutional: Negative for fever, chills, weight loss, malaise/fatigue and diaphoresis.  HENT: Positive for hearing loss. Negative for congestion, ear pain and sore throat.   Eyes: Negative for pain, discharge and redness.  Respiratory: Negative for cough, sputum production, shortness of breath and wheezing.   Cardiovascular: Positive for leg swelling (chronic in ankles, L>R). Negative for chest pain, palpitations, orthopnea, claudication and  PND.  Gastrointestinal: Negative for heartburn, nausea, vomiting, abdominal pain, diarrhea and constipation.       Hemorrhoids-irritated  Genitourinary: Positive for frequency (chronic). Negative for dysuria, urgency and flank pain.  Musculoskeletal: Positive for back pain, joint pain and myalgias. Negative for neck pain.  Skin: Negative for itching and rash.  Neurological: Positive for focal weakness (left sided weakness grip strength 4-5/5). Negative for dizziness, tingling, tremors, speech change, seizures, loss of consciousness, weakness and headaches.  Endo/Heme/Allergies: Negative for environmental allergies and polydipsia. Does not bruise/bleed easily.  Psychiatric/Behavioral: Positive for depression and memory loss. Negative for hallucinations. The patient is nervous/anxious. The patient does not have insomnia.      Past Medical History  Diagnosis Date  . Thyroid disease hypotyroidism  . Diabetes mellitus without complication   . Anemia   . Depression   . Vitamin D deficiency   . Sleep apnea   . Hypertension   . Osteoarthritis   . GERD (gastroesophageal reflux disease)    Past Surgical History  Procedure Laterality Date  . Brain meningioma excision    . Cholecystectomy    . Tonsillectomy    . Tracheostomy     Social History:   reports that she has never smoked. She has never used smokeless tobacco. She reports that she does not drink alcohol or use  illicit drugs.  Family History  Problem Relation Age of Onset  . Diabetes Mother     Medications: Patient's Medications  New Prescriptions   No medications on file  Previous Medications   ACETAMINOPHEN (TYLENOL) 325 MG TABLET    Take 650 mg by mouth every 6 (six) hours as needed for pain.   ALBUTEROL (PROVENTIL) (5 MG/ML) 0.5% NEBULIZER SOLUTION    Take 0.5 mLs (2.5 mg total) by nebulization every 2 (two) hours as needed for wheezing or shortness of breath.   ALBUTEROL (PROVENTIL) (5 MG/ML) 0.5% NEBULIZER SOLUTION     Take 0.5 mLs (2.5 mg total) by nebulization every 6 (six) hours.   CALCIUM CARBONATE (TUMS - DOSED IN MG ELEMENTAL CALCIUM) 500 MG CHEWABLE TABLET    Chew 1 tablet by mouth 2 (two) times daily as needed for heartburn.   CHOLECALCIFEROL (VITAMIN D-3) 1000 UNITS CAPS    Take 1,000 Units by mouth daily.   FLUOXETINE (PROZAC) 40 MG CAPSULE    Take 40 mg by mouth daily.   FOLIC ACID (FOLVITE) 1 MG TABLET    Take 1 mg by mouth daily.   GLIPIZIDE (GLUCOTROL) 5 MG TABLET    Take 5 mg by mouth 2 (two) times daily before a meal.   HYDROXYPROPYL METHYLCELLULOSE (ISOPTO TEARS) 2.5 % OPHTHALMIC SOLUTION    Place 1 drop into both eyes 2 (two) times daily.   IPRATROPIUM (ATROVENT) 0.02 % NEBULIZER SOLUTION    Take 2.5 mLs (0.5 mg total) by nebulization every 6 (six) hours.   LEVOFLOXACIN (LEVAQUIN) 500 MG TABLET    Take 1 tablet (500 mg total) by mouth daily.   LEVOTHYROXINE (SYNTHROID, LEVOTHROID) 50 MCG TABLET    Take 50 mcg by mouth daily. Take 1 tab by mouth every day. * Check pulse weekly on Monday*   MEMANTINE (NAMENDA) 10 MG TABLET    Take 10 mg by mouth 2 (two) times daily.   METFORMIN (GLUCOPHAGE) 500 MG TABLET    Take 500 mg by mouth 2 (two) times daily with a meal. Take with food.   NYSTATIN (MYCOSTATIN/NYSTOP) 100000 UNIT/GM POWD    Apply 1 g topically 2 (two) times daily as needed (applied to skin folds twice daily as needed for rash).   NYSTATIN-TRIAMCINOLONE (MYCOLOG II) CREAM    Apply 1 application topically at bedtime. To perineal area   OMEPRAZOLE (PRILOSEC) 20 MG CAPSULE    Take 20 mg by mouth daily.   POLYETHYLENE GLYCOL (MIRALAX / GLYCOLAX) PACKET    Take 17 g by mouth daily. Fill cap to 17 gm mark, mix with 8 ounces of water and take by mouth every day.   PREDNISONE (DELTASONE) 10 MG TABLET    Take 3 tabs daily for 3 days, then 2 tabs daily for 3 days, then 1 tab daily for 3 days, then stop.   PREGABALIN (LYRICA) 75 MG CAPSULE    Take 75 mg by mouth at bedtime.   SACCHAROMYCES BOULARDII  (FLORASTOR) 250 MG CAPSULE    Take 250 mg by mouth 2 (two) times daily.   SENNA-DOCUSATE (SENOKOT-S) 8.6-50 MG PER TABLET    Take 2 tablets by mouth every other day.   SUCRALFATE (CARAFATE) 1 G TABLET    Take 1 tablet (1 g total) by mouth 2 (two) times daily.   VITAMIN B-12 (CYANOCOBALAMIN) 1000 MCG TABLET    Take 1,000 mcg by mouth every evening.  Modified Medications   No medications on file  Discontinued Medications   No  medications on file     Physical Exam: Physical Exam  Constitutional: She is oriented to person, place, and time. She appears well-developed and well-nourished.  HENT:  Head: Normocephalic and atraumatic.  Eyes: Conjunctivae and EOM are normal. Pupils are equal, round, and reactive to light.  Neck: Normal range of motion. Neck supple. No JVD present. No thyromegaly present.  Cardiovascular: Normal rate, regular rhythm and normal heart sounds.   No murmur heard. Pulmonary/Chest: Effort normal. No respiratory distress. She has no wheezes. She has rales (bibasilar dry rales. ). She exhibits no tenderness.  Abdominal: Soft. Bowel sounds are normal. She exhibits no distension. There is no tenderness.  Musculoskeletal: Normal range of motion. She exhibits edema (ankle L>R). She exhibits no tenderness.  Lymphadenopathy:    She has no cervical adenopathy.  Neurological: She is alert and oriented to person, place, and time. She has normal reflexes. No cranial nerve deficit. She exhibits abnormal muscle tone (left sided is weaker than the right with muscle strength 5/5). Coordination abnormal.  Left sided weakness even if grip strength 5/5  Skin: No rash noted.  Surgical incision behind left ear  Psychiatric: Thought content normal. Her mood appears anxious. Her affect is angry and inappropriate. Her affect is not blunt and not labile. She is agitated. She is not aggressive, not hyperactive, not slowed, not withdrawn, not actively hallucinating and not combative. Thought  content is not paranoid and not delusional. Cognition and memory are impaired. She expresses impulsivity and inappropriate judgment. She does not exhibit a depressed mood. She expresses no homicidal ideation. She expresses no homicidal plans. She exhibits abnormal recent memory. She exhibits normal remote memory. She is attentive.    Filed Vitals:   05/14/14 1657  BP: 109/55  Pulse: 64  Temp: 98.1 F (36.7 C)  TempSrc: Tympanic  Resp: 88   Labs reviewed: Basic Metabolic Panel:  Recent Labs  09/12/13 09/14/13 12/28/13  NA 137 142 137  K 3.4 3.9 3.7  BUN 9 12 11   CREATININE 0.8 1.0 0.9  TSH 1.39  --   --    Liver Function Tests:  Recent Labs  09/12/13 12/28/13  AST 9* 9*  ALT 8 8  ALKPHOS 68 70   CBC:  Recent Labs  09/14/13 12/28/13 01/02/14  WBC 11.4 11.1 11.6  HGB 12.2 9.6* 10.9*  HCT 35* 28* 32*  PLT 254 256 265   Past Procedures:  12/27/13 CXR no cardiomegaly, pulmonary vascular congestion or pleural effusion, patchy left basilar atelectasis or pneumonia unchanged.   Assessment/Plan Diabetes mellitus Controlled, Hgb A1c 6.0 08/15/13. Continue Metformin 500mg  bid.   Unspecified constipation Stable, continue Miralax daily and Senokot S II qod    Reflux esophagitis diet modified to Nectar thick liquid and Purred diet. Better with Omeprazole 20mg  daily and Carafate. Less c/o nausea    Dementia, in, senility Stabilized on Namenda     Major depressive disorder, single episode, unspecified better with Prozac 40mg  since 05/01/13 and off prn Ativan 0.5mg  q8hr    Unspecified hypothyroidism Takes Levothyroxine 24mcg since 03/07/12, TSH 1.973 11/15/12, updated TSH 2.222 04/20/13, 1.386 09/12/13     Neuralgia, neuritis, and radiculitis, unspecified Pain is managed with Lyrica 75mg  nightly.       Family/ Staff Communication: observe the patient  Goals of Care: SNF  Labs/tests ordered: none

## 2014-05-14 NOTE — Assessment & Plan Note (Signed)
Stabilized on Namenda   

## 2014-05-14 NOTE — Assessment & Plan Note (Signed)
Pain is managed with Lyrica 75mg  nightly.

## 2014-05-14 NOTE — Assessment & Plan Note (Addendum)
Controlled, Hgb A1c 6.0 08/15/13. Continue Metformin 500mg  bid.

## 2014-05-14 NOTE — Assessment & Plan Note (Signed)
diet modified to Nectar thick liquid and Purred diet. Better with Omeprazole 20mg  daily and Carafate. Less c/o nausea

## 2014-05-14 NOTE — Assessment & Plan Note (Signed)
better with Prozac 40mg since 05/01/13 and off prn Ativan 0.5mg q8hr     

## 2014-05-14 NOTE — Assessment & Plan Note (Signed)
Stable, continue Miralax daily and Senokot S II qod  

## 2014-05-14 NOTE — Assessment & Plan Note (Signed)
Takes Levothyroxine 76mcg since 03/07/12, TSH 1.973 11/15/12, updated TSH 2.222 04/20/13, 1.386 09/12/13

## 2014-05-15 LAB — HEMOGLOBIN A1C: HEMOGLOBIN A1C: 6 % (ref 4.0–6.0)

## 2014-05-16 ENCOUNTER — Other Ambulatory Visit: Payer: Self-pay | Admitting: Nurse Practitioner

## 2014-06-06 ENCOUNTER — Non-Acute Institutional Stay (SKILLED_NURSING_FACILITY): Payer: Medicare Other | Admitting: Nurse Practitioner

## 2014-06-06 ENCOUNTER — Encounter: Payer: Self-pay | Admitting: Nurse Practitioner

## 2014-06-06 DIAGNOSIS — R609 Edema, unspecified: Secondary | ICD-10-CM

## 2014-06-06 DIAGNOSIS — K2100 Gastro-esophageal reflux disease with esophagitis, without bleeding: Secondary | ICD-10-CM

## 2014-06-06 DIAGNOSIS — F329 Major depressive disorder, single episode, unspecified: Secondary | ICD-10-CM

## 2014-06-06 DIAGNOSIS — E119 Type 2 diabetes mellitus without complications: Secondary | ICD-10-CM

## 2014-06-06 DIAGNOSIS — F0390 Unspecified dementia without behavioral disturbance: Secondary | ICD-10-CM

## 2014-06-06 DIAGNOSIS — IMO0002 Reserved for concepts with insufficient information to code with codable children: Secondary | ICD-10-CM

## 2014-06-06 DIAGNOSIS — K5900 Constipation, unspecified: Secondary | ICD-10-CM

## 2014-06-06 DIAGNOSIS — E039 Hypothyroidism, unspecified: Secondary | ICD-10-CM

## 2014-06-06 DIAGNOSIS — D649 Anemia, unspecified: Secondary | ICD-10-CM

## 2014-06-06 NOTE — Assessment & Plan Note (Signed)
better with Prozac 40mg since 05/01/13 and off prn Ativan 0.5mg q8hr     

## 2014-06-06 NOTE — Assessment & Plan Note (Signed)
Stable, continue Miralax daily and Senokot S II qod  

## 2014-06-06 NOTE — Assessment & Plan Note (Signed)
Takes Levothyroxine 35mcg since 03/07/12, TSH 1.973 11/15/12, updated TSH 2.222 04/20/13, 1.386 09/12/13. Update TSH

## 2014-06-06 NOTE — Assessment & Plan Note (Signed)
diet modified to Nectar thick liquid and Purred diet. Better with Omeprazole 20mg  daily and Carafate bid. Less c/o nausea

## 2014-06-06 NOTE — Progress Notes (Signed)
Patient ID: Sonya Garrett, female   DOB: 10-30-1928, 80 y.o.   MRN: [id number]   Code Status: DNR  Allergies  Allergen Reactions  . Codeine Phosphate     unknown  . Darvocet [Propoxyphene N-Acetaminophen]     unknown  . Ibuprofen     unknown  . Prozac [Fluoxetine Hcl]     unknown  . Reglan [Metoclopramide]     unknown  . Tofranil [Imipramine Hcl]     unknown    Chief Complaint  Patient presents with  . Medical Management of Chronic Issues    HPI: Patient is a 80 y.o. female seen in the SNF at [facility] today for evaluation of chronic medical conditions.  Problem List Items Addressed This Visit   Anemia, unspecified     Normal Iron 48 01/02/14. Hgb 10.9 01/02/14. Continue to observe. Continue B12 and Folate. Update CBC     Dementia, in, senility     Stabilized on Namenda       Diabetes mellitus (Chronic)     Controlled, Hgb A1c 6.0 08/15/13 and 05/15/14. Continue Metformin 500mg  bid and Glipizide 5mg  bid.      Edema     Left ankle> the right, chronic, no significant weight change in the past 6 months. Update CMP      Major depressive disorder, single episode, unspecified     better with Prozac 40mg  since 05/01/13 and off prn Ativan 0.5mg  q8hr      Neuralgia, neuritis, and radiculitis, unspecified     Pain is managed with Lyrica 75mg  nightly. Mainly in her leg leg.       Reflux esophagitis     diet modified to Nectar thick liquid and Purred diet. Better with Omeprazole 20mg  daily and Carafate bid. Less c/o nausea       Unspecified constipation     Stable, continue Miralax daily and Senokot S II qod      Unspecified hypothyroidism - Primary     Takes Levothyroxine 38mcg since 03/07/12, TSH 1.973 11/15/12, updated TSH 2.222 04/20/13, 1.386 09/12/13. Update TSH          Review of Systems:  Review of Systems  Constitutional: Negative for fever, chills, weight loss, malaise/fatigue and diaphoresis.  HENT: Positive for hearing loss. Negative  for congestion, ear pain and sore throat.   Eyes: Negative for pain, discharge and redness.  Respiratory: Negative for cough, sputum production, shortness of breath and wheezing.   Cardiovascular: Positive for leg swelling (chronic in ankles, L>R). Negative for chest pain, palpitations, orthopnea, claudication and PND.  Gastrointestinal: Negative for heartburn, nausea, vomiting, abdominal pain, diarrhea and constipation.       Hemorrhoids-irritated  Genitourinary: Positive for frequency (chronic). Negative for dysuria, urgency and flank pain.  Musculoskeletal: Positive for back pain, joint pain and myalgias. Negative for neck pain.  Skin: Negative for itching and rash.  Neurological: Positive for focal weakness (left sided weakness grip strength 4-5/5). Negative for dizziness, tingling, tremors, speech change, seizures, loss of consciousness, weakness and headaches.  Endo/Heme/Allergies: Negative for environmental allergies and polydipsia. Does not bruise/bleed easily.  Psychiatric/Behavioral: Positive for depression and memory loss. Negative for hallucinations. The patient is nervous/anxious. The patient does not have insomnia.      Past Medical History  Diagnosis Date  . Thyroid disease hypotyroidism  . Diabetes mellitus without complication   . Anemia   . Depression   . Vitamin D deficiency   . Sleep apnea   . Hypertension   .  Osteoarthritis   . GERD (gastroesophageal reflux disease)    Past Surgical History  Procedure Laterality Date  . Brain meningioma excision    . Cholecystectomy    . Tonsillectomy    . Tracheostomy     Social History:   reports that she has never smoked. She has never used smokeless tobacco. She reports that she does not drink alcohol or use illicit drugs.  Family History  Problem Relation Age of Onset  . Diabetes Mother     Medications: Patient's Medications  New Prescriptions   No medications on file  Previous Medications   ACETAMINOPHEN  (TYLENOL) 325 MG TABLET    Take 650 mg by mouth every 6 (six) hours as needed for pain.   ALBUTEROL (PROVENTIL) (5 MG/ML) 0.5% NEBULIZER SOLUTION    Take 0.5 mLs (2.5 mg total) by nebulization every 2 (two) hours as needed for wheezing or shortness of breath.   ALBUTEROL (PROVENTIL) (5 MG/ML) 0.5% NEBULIZER SOLUTION    Take 0.5 mLs (2.5 mg total) by nebulization every 6 (six) hours.   CALCIUM CARBONATE (TUMS - DOSED IN MG ELEMENTAL CALCIUM) 500 MG CHEWABLE TABLET    Chew 1 tablet by mouth 2 (two) times daily as needed for heartburn.   CHOLECALCIFEROL (VITAMIN D-3) 1000 UNITS CAPS    Take 1,000 Units by mouth daily.   FLUOXETINE (PROZAC) 40 MG CAPSULE    Take 40 mg by mouth daily.   FOLIC ACID (FOLVITE) 1 MG TABLET    Take 1 mg by mouth daily.   GLIPIZIDE (GLUCOTROL) 5 MG TABLET    Take 5 mg by mouth 2 (two) times daily before a meal.   HYDROXYPROPYL METHYLCELLULOSE (ISOPTO TEARS) 2.5 % OPHTHALMIC SOLUTION    Place 1 drop into both eyes 2 (two) times daily.   IPRATROPIUM (ATROVENT) 0.02 % NEBULIZER SOLUTION    Take 2.5 mLs (0.5 mg total) by nebulization every 6 (six) hours.   LEVOFLOXACIN (LEVAQUIN) 500 MG TABLET    Take 1 tablet (500 mg total) by mouth daily.   LEVOTHYROXINE (SYNTHROID, LEVOTHROID) 50 MCG TABLET    Take 50 mcg by mouth daily. Take 1 tab by mouth every day. * Check pulse weekly on Monday*   MEMANTINE (NAMENDA) 10 MG TABLET    Take 10 mg by mouth 2 (two) times daily.   METFORMIN (GLUCOPHAGE) 500 MG TABLET    Take 500 mg by mouth 2 (two) times daily with a meal. Take with food.   NYSTATIN (MYCOSTATIN/NYSTOP) 100000 UNIT/GM POWD    Apply 1 g topically 2 (two) times daily as needed (applied to skin folds twice daily as needed for rash).   NYSTATIN-TRIAMCINOLONE (MYCOLOG II) CREAM    Apply 1 application topically at bedtime. To perineal area   OMEPRAZOLE (PRILOSEC) 20 MG CAPSULE    Take 20 mg by mouth daily.   POLYETHYLENE GLYCOL (MIRALAX / GLYCOLAX) PACKET    Take 17 g by mouth daily.  Fill cap to 17 gm mark, mix with 8 ounces of water and take by mouth every day.   PREDNISONE (DELTASONE) 10 MG TABLET    Take 3 tabs daily for 3 days, then 2 tabs daily for 3 days, then 1 tab daily for 3 days, then stop.   PREGABALIN (LYRICA) 75 MG CAPSULE    Take 75 mg by mouth at bedtime.   SACCHAROMYCES BOULARDII (FLORASTOR) 250 MG CAPSULE    Take 250 mg by mouth 2 (two) times daily.   SENNA-DOCUSATE (SENOKOT-S) 8.6-50 MG  PER TABLET    Take 2 tablets by mouth every other day.   SUCRALFATE (CARAFATE) 1 G TABLET    Take 1 tablet (1 g total) by mouth 2 (two) times daily.   VITAMIN B-12 (CYANOCOBALAMIN) 1000 MCG TABLET    Take 1,000 mcg by mouth every evening.  Modified Medications   No medications on file  Discontinued Medications   No medications on file     Physical Exam: Physical Exam  Constitutional: She is oriented to person, place, and time. She appears well-developed and well-nourished.  HENT:  Head: Normocephalic and atraumatic.  Eyes: Conjunctivae and EOM are normal. Pupils are equal, round, and reactive to light.  Neck: Normal range of motion. Neck supple. No JVD present. No thyromegaly present.  Cardiovascular: Normal rate, regular rhythm and normal heart sounds.   No murmur heard. Pulmonary/Chest: Effort normal. No respiratory distress. She has no wheezes. She has rales (bibasilar dry rales. ). She exhibits no tenderness.  Abdominal: Soft. Bowel sounds are normal. She exhibits no distension. There is no tenderness.  Musculoskeletal: Normal range of motion. She exhibits edema (ankle L>R). She exhibits no tenderness.  Lymphadenopathy:    She has no cervical adenopathy.  Neurological: She is alert and oriented to person, place, and time. She has normal reflexes. No cranial nerve deficit. She exhibits abnormal muscle tone (left sided is weaker than the right with muscle strength 5/5). Coordination abnormal.  Left sided weakness even if grip strength 5/5  Skin: No rash noted.    Surgical incision behind left ear  Psychiatric: Thought content normal. Her mood appears anxious. Her affect is angry and inappropriate. Her affect is not blunt and not labile. She is agitated. She is not aggressive, not hyperactive, not slowed, not withdrawn, not actively hallucinating and not combative. Thought content is not paranoid and not delusional. Cognition and memory are impaired. She expresses impulsivity and inappropriate judgment. She does not exhibit a depressed mood. She expresses no homicidal ideation. She expresses no homicidal plans. She exhibits abnormal recent memory. She exhibits normal remote memory. She is attentive.    Filed Vitals:   06/06/14 1058  BP: 130/78  Pulse: 68  Temp: 98 F (36.7 C)  TempSrc: Tympanic  Resp: 18   Labs reviewed: Basic Metabolic Panel:  Recent Labs  09/12/13 09/14/13 12/28/13  NA 137 142 137  K 3.4 3.9 3.7  BUN 9 12 11   CREATININE 0.8 1.0 0.9  TSH 1.39  --   --    Liver Function Tests:  Recent Labs  09/12/13 12/28/13  AST 9* 9*  ALT 8 8  ALKPHOS 68 70   CBC:  Recent Labs  09/14/13 12/28/13 01/02/14  WBC 11.4 11.1 11.6  HGB 12.2 9.6* 10.9*  HCT 35* 28* 32*  PLT 254 256 265   Past Procedures:  12/27/13 CXR no cardiomegaly, pulmonary vascular congestion or pleural effusion, patchy left basilar atelectasis or pneumonia unchanged.   Assessment/Plan Diabetes mellitus Controlled, Hgb A1c 6.0 08/15/13 and 05/15/14. Continue Metformin 500mg  bid and Glipizide 5mg  bid.    Unspecified constipation Stable, continue Miralax daily and Senokot S II qod    Dementia, in, senility Stabilized on Namenda     Reflux esophagitis diet modified to Nectar thick liquid and Purred diet. Better with Omeprazole 20mg  daily and Carafate bid. Less c/o nausea     Unspecified hypothyroidism Takes Levothyroxine 36mcg since 03/07/12, TSH 1.973 11/15/12, updated TSH 2.222 04/20/13, 1.386 09/12/13. Update TSH     Major depressive disorder,  single episode, unspecified better with Prozac 40mg  since 05/01/13 and off prn Ativan 0.5mg  q8hr    Neuralgia, neuritis, and radiculitis, unspecified Pain is managed with Lyrica 75mg  nightly. Mainly in her leg leg.     Anemia, unspecified Normal Iron 48 01/02/14. Hgb 10.9 01/02/14. Continue to observe. Continue B12 and Folate. Update CBC   Edema Left ankle> the right, chronic, no significant weight change in the past 6 months. Update CMP      Family/ Staff Communication: observe the patient  Goals of Care: SNF  Labs/tests ordered: CBC, CMP, TSH.

## 2014-06-06 NOTE — Assessment & Plan Note (Signed)
Normal Iron 48 01/02/14. Hgb 10.9 01/02/14. Continue to observe. Continue B12 and Folate. Update CBC

## 2014-06-06 NOTE — Assessment & Plan Note (Addendum)
Pain is managed with Lyrica 75mg  nightly. Mainly in her leg leg.

## 2014-06-06 NOTE — Assessment & Plan Note (Addendum)
Controlled, Hgb A1c 6.0 08/15/13 and 05/15/14. Continue Metformin 500mg  bid and Glipizide 5mg  bid.

## 2014-06-06 NOTE — Assessment & Plan Note (Signed)
Stabilized on Namenda   

## 2014-06-06 NOTE — Assessment & Plan Note (Signed)
Left ankle> the right, chronic, no significant weight change in the past 6 months. Update CMP

## 2014-06-07 LAB — CBC AND DIFFERENTIAL
HCT: 32 % — AB (ref 36–46)
HEMOGLOBIN: 11.1 g/dL — AB (ref 12.0–16.0)
Platelets: 233 10*3/uL (ref 150–399)
WBC: 7.6 10^3/mL

## 2014-06-07 LAB — BASIC METABOLIC PANEL
BUN: 11 mg/dL (ref 4–21)
CREATININE: 0.8 mg/dL (ref 0.5–1.1)
Glucose: 82 mg/dL
POTASSIUM: 3.6 mmol/L (ref 3.4–5.3)
SODIUM: 138 mmol/L (ref 137–147)

## 2014-06-07 LAB — TSH: TSH: 1.3 u[IU]/mL (ref 0.41–5.90)

## 2014-06-07 LAB — HEPATIC FUNCTION PANEL
ALT: 10 U/L (ref 7–35)
AST: 12 U/L — AB (ref 13–35)
Alkaline Phosphatase: 93 U/L (ref 25–125)
Bilirubin, Total: 0.2 mg/dL

## 2014-06-14 ENCOUNTER — Other Ambulatory Visit: Payer: Self-pay | Admitting: Nurse Practitioner

## 2014-06-14 DIAGNOSIS — D649 Anemia, unspecified: Secondary | ICD-10-CM

## 2014-06-14 DIAGNOSIS — E039 Hypothyroidism, unspecified: Secondary | ICD-10-CM

## 2014-08-02 ENCOUNTER — Non-Acute Institutional Stay (SKILLED_NURSING_FACILITY): Payer: Medicare Other | Admitting: Nurse Practitioner

## 2014-08-02 ENCOUNTER — Encounter: Payer: Self-pay | Admitting: Nurse Practitioner

## 2014-08-02 DIAGNOSIS — K2100 Gastro-esophageal reflux disease with esophagitis, without bleeding: Secondary | ICD-10-CM

## 2014-08-02 DIAGNOSIS — F324 Major depressive disorder, single episode, in partial remission: Secondary | ICD-10-CM

## 2014-08-02 DIAGNOSIS — F0390 Unspecified dementia without behavioral disturbance: Secondary | ICD-10-CM

## 2014-08-02 DIAGNOSIS — K5900 Constipation, unspecified: Secondary | ICD-10-CM

## 2014-08-02 DIAGNOSIS — E039 Hypothyroidism, unspecified: Secondary | ICD-10-CM

## 2014-08-02 DIAGNOSIS — R609 Edema, unspecified: Secondary | ICD-10-CM

## 2014-08-02 DIAGNOSIS — M792 Neuralgia and neuritis, unspecified: Secondary | ICD-10-CM

## 2014-08-02 DIAGNOSIS — D649 Anemia, unspecified: Secondary | ICD-10-CM

## 2014-08-02 NOTE — Progress Notes (Signed)
Patient ID: Sonya Garrett, female   DOB: [birth year], 80 y.o.   MRN: [id number]   Code Status: DNR    Chief Complaint  Patient presents with  . Medical Management of Chronic Issues    HPI: Patient is a 80 y.o. female seen in the SNF at [facility] today for evaluation of chronic medical conditions.  Problem List Items Addressed This Visit    Reflux esophagitis    diet modified to Nectar thick liquid and Purred diet. Better with Omeprazole 20mg  daily and Carafate bid. Less c/o nausea        Neuralgia and neuritis    Pain is managed with Lyrica 75mg  nightly. Mainly in her leg leg.        Major depressive disorder, single episode    better with Prozac 40mg  since 05/01/13 and off prn Ativan 0.5mg  q8hr      Hypothyroidism - Primary    TSH 1.386 09/12/13 06/07/14 TSH 1.302       Edema    Left ankle> the right, chronic, no significant weight change in the past 6 months.    Dementia, in, senility    Stabilized on Namenda       Constipation    Stable, continue Miralax daily and Senokot S II qod       Anemia    06/07/14 Hgb 11.1        Review of Systems:  Review of Systems  Constitutional: Negative for fever, chills, weight loss, malaise/fatigue and diaphoresis.  HENT: Positive for hearing loss. Negative for congestion, ear pain and sore throat.   Eyes: Negative for pain, discharge and redness.  Respiratory: Negative for cough, sputum production, shortness of breath and wheezing.   Cardiovascular: Positive for leg swelling (chronic in ankles, L>R). Negative for chest pain, palpitations, orthopnea, claudication and PND.  Gastrointestinal: Negative for heartburn, nausea, vomiting, abdominal pain, diarrhea and constipation.       Hemorrhoids-irritated  Genitourinary: Positive for frequency (chronic). Negative for dysuria, urgency and flank pain.  Musculoskeletal: Positive for back pain, joint pain and myalgias. Negative for neck pain.  Skin: Negative for  itching and rash.  Neurological: Positive for focal weakness (left sided weakness grip strength 4-5/5). Negative for dizziness, tingling, tremors, speech change, seizures, loss of consciousness, weakness and headaches.  Endo/Heme/Allergies: Negative for environmental allergies and polydipsia. Does not bruise/bleed easily.  Psychiatric/Behavioral: Positive for depression and memory loss. Negative for hallucinations. The patient is nervous/anxious. The patient does not have insomnia.      Past Medical History  Diagnosis Date  . Thyroid disease hypotyroidism  . Diabetes mellitus without complication   . Anemia   . Depression   . Vitamin D deficiency   . Sleep apnea   . Hypertension   . Osteoarthritis   . GERD (gastroesophageal reflux disease)    Past Surgical History  Procedure Laterality Date  . Brain meningioma excision    . Cholecystectomy    . Tonsillectomy    . Tracheostomy     Social History:   reports that she has never smoked. She has never used smokeless tobacco. She reports that she does not drink alcohol or use illicit drugs.  Family History  Problem Relation Age of Onset  . Diabetes Mother     Medications: Patient's Medications  New Prescriptions   No medications on file  Previous Medications   ACETAMINOPHEN (TYLENOL) 325 MG TABLET    Take 650 mg by mouth every 6 (six) hours as needed for pain.  ALBUTEROL (PROVENTIL) (5 MG/ML) 0.5% NEBULIZER SOLUTION    Take 0.5 mLs (2.5 mg total) by nebulization every 2 (two) hours as needed for wheezing or shortness of breath.   ALBUTEROL (PROVENTIL) (5 MG/ML) 0.5% NEBULIZER SOLUTION    Take 0.5 mLs (2.5 mg total) by nebulization every 6 (six) hours.   CALCIUM CARBONATE (TUMS - DOSED IN MG ELEMENTAL CALCIUM) 500 MG CHEWABLE TABLET    Chew 1 tablet by mouth 2 (two) times daily as needed for heartburn.   CHOLECALCIFEROL (VITAMIN D-3) 1000 UNITS CAPS    Take 1,000 Units by mouth daily.   FLUOXETINE (PROZAC) 40 MG CAPSULE    Take  40 mg by mouth daily.   FOLIC ACID (FOLVITE) 1 MG TABLET    Take 1 mg by mouth daily.   GLIPIZIDE (GLUCOTROL) 5 MG TABLET    Take 5 mg by mouth 2 (two) times daily before a meal.   HYDROXYPROPYL METHYLCELLULOSE (ISOPTO TEARS) 2.5 % OPHTHALMIC SOLUTION    Place 1 drop into both eyes 2 (two) times daily.   IPRATROPIUM (ATROVENT) 0.02 % NEBULIZER SOLUTION    Take 2.5 mLs (0.5 mg total) by nebulization every 6 (six) hours.   LEVOFLOXACIN (LEVAQUIN) 500 MG TABLET    Take 1 tablet (500 mg total) by mouth daily.   LEVOTHYROXINE (SYNTHROID, LEVOTHROID) 50 MCG TABLET    Take 50 mcg by mouth daily. Take 1 tab by mouth every day. * Check pulse weekly on Monday*   MEMANTINE (NAMENDA) 10 MG TABLET    Take 10 mg by mouth 2 (two) times daily.   METFORMIN (GLUCOPHAGE) 500 MG TABLET    Take 500 mg by mouth 2 (two) times daily with a meal. Take with food.   NYSTATIN (MYCOSTATIN/NYSTOP) 100000 UNIT/GM POWD    Apply 1 g topically 2 (two) times daily as needed (applied to skin folds twice daily as needed for rash).   NYSTATIN-TRIAMCINOLONE (MYCOLOG II) CREAM    Apply 1 application topically at bedtime. To perineal area   OMEPRAZOLE (PRILOSEC) 20 MG CAPSULE    Take 20 mg by mouth daily.   POLYETHYLENE GLYCOL (MIRALAX / GLYCOLAX) PACKET    Take 17 g by mouth daily. Fill cap to 17 gm mark, mix with 8 ounces of water and take by mouth every day.   PREDNISONE (DELTASONE) 10 MG TABLET    Take 3 tabs daily for 3 days, then 2 tabs daily for 3 days, then 1 tab daily for 3 days, then stop.   PREGABALIN (LYRICA) 75 MG CAPSULE    Take 75 mg by mouth at bedtime.   SACCHAROMYCES BOULARDII (FLORASTOR) 250 MG CAPSULE    Take 250 mg by mouth 2 (two) times daily.   SENNA-DOCUSATE (SENOKOT-S) 8.6-50 MG PER TABLET    Take 2 tablets by mouth every other day.   SUCRALFATE (CARAFATE) 1 G TABLET    Take 1 tablet (1 g total) by mouth 2 (two) times daily.   VITAMIN B-12 (CYANOCOBALAMIN) 1000 MCG TABLET    Take 1,000 mcg by mouth every evening.    Modified Medications   No medications on file  Discontinued Medications   No medications on file     Physical Exam: Physical Exam  Constitutional: She is oriented to person, place, and time. She appears well-developed and well-nourished.  HENT:  Head: Normocephalic and atraumatic.  Eyes: Conjunctivae and EOM are normal. Pupils are equal, round, and reactive to light.  Neck: Normal range of motion. Neck supple. No JVD  present. No thyromegaly present.  Cardiovascular: Normal rate, regular rhythm and normal heart sounds.   No murmur heard. Pulmonary/Chest: Effort normal. No respiratory distress. She has no wheezes. She has rales (bibasilar dry rales. ). She exhibits no tenderness.  Abdominal: Soft. Bowel sounds are normal. She exhibits no distension. There is no tenderness.  Musculoskeletal: Normal range of motion. She exhibits edema (ankle L>R). She exhibits no tenderness.  Lymphadenopathy:    She has no cervical adenopathy.  Neurological: She is alert and oriented to person, place, and time. She has normal reflexes. No cranial nerve deficit. She exhibits abnormal muscle tone (left sided is weaker than the right with muscle strength 5/5). Coordination abnormal.  Left sided weakness even if grip strength 5/5  Skin: No rash noted.  Surgical incision behind left ear  Psychiatric: Thought content normal. Her mood appears anxious. Her affect is angry and inappropriate. Her affect is not blunt and not labile. She is agitated. She is not aggressive, not hyperactive, not slowed, not withdrawn, not actively hallucinating and not combative. Thought content is not paranoid and not delusional. Cognition and memory are impaired. She expresses impulsivity and inappropriate judgment. She does not exhibit a depressed mood. She expresses no homicidal ideation. She expresses no homicidal plans. She exhibits abnormal recent memory. She exhibits normal remote memory. She is attentive.    Filed Vitals:    08/02/14 1639  BP: 153/79  Pulse: 73  Temp: 96 F (35.6 C)  TempSrc: Tympanic  Resp: 16   Labs reviewed: Basic Metabolic Panel:  Recent Labs  09/12/13 09/14/13 12/28/13 06/07/14  NA 137 142 137 138  K 3.4 3.9 3.7 3.6  BUN 9 12 11 11   CREATININE 0.8 1.0 0.9 0.8  TSH 1.39  --   --  1.30   Liver Function Tests:  Recent Labs  09/12/13 12/28/13 06/07/14  AST 9* 9* 12*  ALT 8 8 10   ALKPHOS 68 70 93   CBC:  Recent Labs  12/28/13 01/02/14 06/07/14  WBC 11.1 11.6 7.6  HGB 9.6* 10.9* 11.1*  HCT 28* 32* 32*  PLT 256 265 233   Past Procedures:  12/27/13 CXR no cardiomegaly, pulmonary vascular congestion or pleural effusion, patchy left basilar atelectasis or pneumonia unchanged.   Assessment/Plan Hypothyroidism TSH 1.386 09/12/13 06/07/14 TSH 1.302     Major depressive disorder, single episode better with Prozac 40mg  since 05/01/13 and off prn Ativan 0.5mg  q8hr    Anemia 06/07/14 Hgb 11.1   Dementia, in, senility Stabilized on Namenda     Diabetes mellitus Hgb A1c 6.0 08/15/13 05/15/14 Hgb A1c 6.0 Continue Metformin 500mg  bid and Glipizide 5mg  bid.     Edema Left ankle> the right, chronic, no significant weight change in the past 6 months.  Neuralgia and neuritis Pain is managed with Lyrica 75mg  nightly. Mainly in her leg leg.      Reflux esophagitis diet modified to Nectar thick liquid and Purred diet. Better with Omeprazole 20mg  daily and Carafate bid. Less c/o nausea      Constipation Stable, continue Miralax daily and Senokot S II qod       Family/ Staff Communication: observe the patient  Goals of Care: SNF  Labs/tests ordered: none

## 2014-08-06 NOTE — Assessment & Plan Note (Signed)
Hgb A1c 6.0 08/15/13 05/15/14 Hgb A1c 6.0 Continue Metformin 500mg bid and Glipizide 5mg bid.   

## 2014-08-06 NOTE — Assessment & Plan Note (Signed)
diet modified to Nectar thick liquid and Purred diet. Better with Omeprazole 20mg  daily and Carafate bid. Less c/o nausea

## 2014-08-06 NOTE — Assessment & Plan Note (Signed)
TSH 1.386 09/12/13 06/07/14 TSH 1.302

## 2014-08-06 NOTE — Assessment & Plan Note (Signed)
Stable, continue Miralax daily and Senokot S II qod  

## 2014-08-06 NOTE — Assessment & Plan Note (Signed)
better with Prozac 40mg since 05/01/13 and off prn Ativan 0.5mg q8hr     

## 2014-08-06 NOTE — Assessment & Plan Note (Signed)
Left ankle> the right, chronic, no significant weight change in the past 6 months.

## 2014-08-06 NOTE — Assessment & Plan Note (Signed)
Stabilized on Namenda   

## 2014-08-06 NOTE — Assessment & Plan Note (Signed)
Pain is managed with Lyrica 75mg  nightly. Mainly in her leg leg.

## 2014-08-06 NOTE — Assessment & Plan Note (Signed)
06/07/14 Hgb 11.1

## 2014-08-28 ENCOUNTER — Other Ambulatory Visit: Payer: Self-pay

## 2014-08-28 MED ORDER — PREGABALIN 75 MG PO CAPS: 75.0000 mg | ORAL_CAPSULE | Freq: Every day | ORAL | Status: DC

## 2014-08-28 NOTE — Telephone Encounter (Signed)
Manually faxed to [facility]

## 2014-09-03 ENCOUNTER — Encounter: Payer: Self-pay | Admitting: Nurse Practitioner

## 2014-09-03 ENCOUNTER — Non-Acute Institutional Stay (SKILLED_NURSING_FACILITY): Payer: Medicare Other | Admitting: Nurse Practitioner

## 2014-09-03 DIAGNOSIS — D649 Anemia, unspecified: Secondary | ICD-10-CM

## 2014-09-03 DIAGNOSIS — R609 Edema, unspecified: Secondary | ICD-10-CM

## 2014-09-03 DIAGNOSIS — M792 Neuralgia and neuritis, unspecified: Secondary | ICD-10-CM

## 2014-09-03 DIAGNOSIS — F324 Major depressive disorder, single episode, in partial remission: Secondary | ICD-10-CM

## 2014-09-03 DIAGNOSIS — K2100 Gastro-esophageal reflux disease with esophagitis, without bleeding: Secondary | ICD-10-CM

## 2014-09-03 DIAGNOSIS — E039 Hypothyroidism, unspecified: Secondary | ICD-10-CM

## 2014-09-03 DIAGNOSIS — F0390 Unspecified dementia without behavioral disturbance: Secondary | ICD-10-CM

## 2014-09-03 DIAGNOSIS — K5900 Constipation, unspecified: Secondary | ICD-10-CM

## 2014-09-03 NOTE — Progress Notes (Signed)
Patient ID: Sonya Garrett, female   DOB: 06-18-1929, 80 y.o.   MRN: [id number]   Code Status: DNR    Chief Complaint  Patient presents with  . Medical Management of Chronic Issues    HPI: Patient is a 80 y.o. female seen in the SNF at [facility] today for evaluation of chronic medical conditions.  Problem List Items Addressed This Visit    Reflux esophagitis - Primary    diet modified to Nectar thick liquid and Purred diet. Better with Omeprazole 20mg  daily and Carafate bid. Less c/o nausea         Neuralgia and neuritis    Pain is managed with Lyrica 75mg  nightly. Mainly in her leg leg.       Major depressive disorder, single episode    better with Prozac 40mg  since 05/01/13 and off prn Ativan 0.5mg  q8hr       Hypothyroidism    TSH 1.386 09/12/13 06/07/14 TSH 1.302 Takes Levothyroxine 4mcg po daily.        Edema    Left ankle> the right, chronic, no significant weight change in the past 6 months.     Dementia, in, senility    Stabilized on Namenda       Constipation    Stable, continue Miralax daily and Senokot S II qod        Anemia    06/07/14 Hgb 11.1         Review of Systems:  Review of Systems  Constitutional: Negative for fever, chills, weight loss, malaise/fatigue and diaphoresis.  HENT: Positive for hearing loss. Negative for congestion, ear pain and sore throat.   Eyes: Negative for pain, discharge and redness.  Respiratory: Negative for cough, sputum production, shortness of breath and wheezing.   Cardiovascular: Positive for leg swelling (chronic in ankles, L>R). Negative for chest pain, palpitations, orthopnea, claudication and PND.  Gastrointestinal: Negative for heartburn, nausea, vomiting, abdominal pain, diarrhea and constipation.       Hemorrhoids-irritated  Genitourinary: Positive for frequency (chronic). Negative for dysuria, urgency and flank pain.  Musculoskeletal: Positive for back pain, joint pain and myalgias.  Negative for neck pain.  Skin: Negative for itching and rash.  Neurological: Positive for focal weakness (left sided weakness grip strength 4-5/5). Negative for dizziness, tingling, tremors, speech change, seizures, loss of consciousness, weakness and headaches.  Endo/Heme/Allergies: Negative for environmental allergies and polydipsia. Does not bruise/bleed easily.  Psychiatric/Behavioral: Positive for depression and memory loss. Negative for hallucinations. The patient is nervous/anxious. The patient does not have insomnia.      Past Medical History  Diagnosis Date  . Thyroid disease hypotyroidism  . Diabetes mellitus without complication   . Anemia   . Depression   . Vitamin D deficiency   . Sleep apnea   . Hypertension   . Osteoarthritis   . GERD (gastroesophageal reflux disease)    Past Surgical History  Procedure Laterality Date  . Brain meningioma excision    . Cholecystectomy    . Tonsillectomy    . Tracheostomy     Social History:   reports that she has never smoked. She has never used smokeless tobacco. She reports that she does not drink alcohol or use illicit drugs.  Family History  Problem Relation Age of Onset  . Diabetes Mother     Medications: Patient's Medications  New Prescriptions   No medications on file  Previous Medications   ACETAMINOPHEN (TYLENOL) 325 MG TABLET    Take 650 mg  by mouth every 6 (six) hours as needed for pain.   ALBUTEROL (PROVENTIL) (5 MG/ML) 0.5% NEBULIZER SOLUTION    Take 0.5 mLs (2.5 mg total) by nebulization every 2 (two) hours as needed for wheezing or shortness of breath.   ALBUTEROL (PROVENTIL) (5 MG/ML) 0.5% NEBULIZER SOLUTION    Take 0.5 mLs (2.5 mg total) by nebulization every 6 (six) hours.   CALCIUM CARBONATE (TUMS - DOSED IN MG ELEMENTAL CALCIUM) 500 MG CHEWABLE TABLET    Chew 1 tablet by mouth 2 (two) times daily as needed for heartburn.   CHOLECALCIFEROL (VITAMIN D-3) 1000 UNITS CAPS    Take 1,000 Units by mouth daily.     FLUOXETINE (PROZAC) 40 MG CAPSULE    Take 40 mg by mouth daily.   FOLIC ACID (FOLVITE) 1 MG TABLET    Take 1 mg by mouth daily.   GLIPIZIDE (GLUCOTROL) 5 MG TABLET    Take 5 mg by mouth 2 (two) times daily before a meal.   HYDROXYPROPYL METHYLCELLULOSE (ISOPTO TEARS) 2.5 % OPHTHALMIC SOLUTION    Place 1 drop into both eyes 2 (two) times daily.   IPRATROPIUM (ATROVENT) 0.02 % NEBULIZER SOLUTION    Take 2.5 mLs (0.5 mg total) by nebulization every 6 (six) hours.   LEVOFLOXACIN (LEVAQUIN) 500 MG TABLET    Take 1 tablet (500 mg total) by mouth daily.   LEVOTHYROXINE (SYNTHROID, LEVOTHROID) 50 MCG TABLET    Take 50 mcg by mouth daily. Take 1 tab by mouth every day. * Check pulse weekly on Monday*   MEMANTINE (NAMENDA) 10 MG TABLET    Take 10 mg by mouth 2 (two) times daily.   METFORMIN (GLUCOPHAGE) 500 MG TABLET    Take 500 mg by mouth 2 (two) times daily with a meal. Take with food.   NYSTATIN (MYCOSTATIN/NYSTOP) 100000 UNIT/GM POWD    Apply 1 g topically 2 (two) times daily as needed (applied to skin folds twice daily as needed for rash).   NYSTATIN-TRIAMCINOLONE (MYCOLOG II) CREAM    Apply 1 application topically at bedtime. To perineal area   OMEPRAZOLE (PRILOSEC) 20 MG CAPSULE    Take 20 mg by mouth daily.   POLYETHYLENE GLYCOL (MIRALAX / GLYCOLAX) PACKET    Take 17 g by mouth daily. Fill cap to 17 gm mark, mix with 8 ounces of water and take by mouth every day.   PREDNISONE (DELTASONE) 10 MG TABLET    Take 3 tabs daily for 3 days, then 2 tabs daily for 3 days, then 1 tab daily for 3 days, then stop.   PREGABALIN (LYRICA) 75 MG CAPSULE    Take 1 capsule (75 mg total) by mouth at bedtime.   SACCHAROMYCES BOULARDII (FLORASTOR) 250 MG CAPSULE    Take 250 mg by mouth 2 (two) times daily.   SENNA-DOCUSATE (SENOKOT-S) 8.6-50 MG PER TABLET    Take 2 tablets by mouth every other day.   SUCRALFATE (CARAFATE) 1 G TABLET    Take 1 tablet (1 g total) by mouth 2 (two) times daily.   VITAMIN B-12  (CYANOCOBALAMIN) 1000 MCG TABLET    Take 1,000 mcg by mouth every evening.  Modified Medications   No medications on file  Discontinued Medications   No medications on file     Physical Exam: Physical Exam  Constitutional: She is oriented to person, place, and time. She appears well-developed and well-nourished.  HENT:  Head: Normocephalic and atraumatic.  Eyes: Conjunctivae and EOM are normal. Pupils are equal,  round, and reactive to light.  Neck: Normal range of motion. Neck supple. No JVD present. No thyromegaly present.  Cardiovascular: Normal rate, regular rhythm and normal heart sounds.   No murmur heard. Pulmonary/Chest: Effort normal. No respiratory distress. She has no wheezes. She has rales (bibasilar dry rales. ). She exhibits no tenderness.  Abdominal: Soft. Bowel sounds are normal. She exhibits no distension. There is no tenderness.  Musculoskeletal: Normal range of motion. She exhibits edema (ankle L>R). She exhibits no tenderness.  Lymphadenopathy:    She has no cervical adenopathy.  Neurological: She is alert and oriented to person, place, and time. She has normal reflexes. No cranial nerve deficit. She exhibits abnormal muscle tone (left sided is weaker than the right with muscle strength 5/5). Coordination abnormal.  Left sided weakness even if grip strength 5/5  Skin: No rash noted.  Surgical incision behind left ear  Psychiatric: Thought content normal. Her mood appears anxious. Her affect is angry and inappropriate. Her affect is not blunt and not labile. She is agitated. She is not aggressive, not hyperactive, not slowed, not withdrawn, not actively hallucinating and not combative. Thought content is not paranoid and not delusional. Cognition and memory are impaired. She expresses impulsivity and inappropriate judgment. She does not exhibit a depressed mood. She expresses no homicidal ideation. She expresses no homicidal plans. She exhibits abnormal recent memory. She  exhibits normal remote memory. She is attentive.    Filed Vitals:   09/03/14 1406  BP: 130/70  Pulse: 80  Temp: 97.3 F (36.3 C)  TempSrc: Tympanic  Resp: 20   Labs reviewed: Basic Metabolic Panel:  Recent Labs  09/12/13 09/14/13 12/28/13 06/07/14  NA 137 142 137 138  K 3.4 3.9 3.7 3.6  BUN 9 12 11 11   CREATININE 0.8 1.0 0.9 0.8  TSH 1.39  --   --  1.30   Liver Function Tests:  Recent Labs  09/12/13 12/28/13 06/07/14  AST 9* 9* 12*  ALT 8 8 10   ALKPHOS 68 70 93   CBC:  Recent Labs  12/28/13 01/02/14 06/07/14  WBC 11.1 11.6 7.6  HGB 9.6* 10.9* 11.1*  HCT 28* 32* 32*  PLT 256 265 233   Past Procedures:  12/27/13 CXR no cardiomegaly, pulmonary vascular congestion or pleural effusion, patchy left basilar atelectasis or pneumonia unchanged.   Assessment/Plan Reflux esophagitis diet modified to Nectar thick liquid and Purred diet. Better with Omeprazole 20mg  daily and Carafate bid. Less c/o nausea       Neuralgia and neuritis Pain is managed with Lyrica 75mg  nightly. Mainly in her leg leg.     Major depressive disorder, single episode better with Prozac 40mg  since 05/01/13 and off prn Ativan 0.5mg  q8hr     Hypothyroidism TSH 1.386 09/12/13 06/07/14 TSH 1.302 Takes Levothyroxine 62mcg po daily.      Edema Left ankle> the right, chronic, no significant weight change in the past 6 months.   Diabetes mellitus Hgb A1c 6.0 08/15/13 05/15/14 Hgb A1c 6.0 Continue Metformin 500mg  bid and Glipizide 5mg  bid.      Dementia, in, senility Stabilized on Namenda     Constipation Stable, continue Miralax daily and Senokot S II qod      Anemia 06/07/14 Hgb 11.1      Family/ Staff Communication: observe the patient  Goals of Care: SNF  Labs/tests ordered: none

## 2014-09-03 NOTE — Assessment & Plan Note (Signed)
Stabilized on Namenda   

## 2014-09-03 NOTE — Assessment & Plan Note (Signed)
Pain is managed with Lyrica 75mg  nightly. Mainly in her leg leg.

## 2014-09-03 NOTE — Assessment & Plan Note (Signed)
Left ankle> the right, chronic, no significant weight change in the past 6 months.

## 2014-09-03 NOTE — Assessment & Plan Note (Signed)
Hgb A1c 6.0 08/15/13 05/15/14 Hgb A1c 6.0 Continue Metformin 500mg bid and Glipizide 5mg bid.   

## 2014-09-03 NOTE — Assessment & Plan Note (Signed)
06/07/14 Hgb 11.1

## 2014-09-03 NOTE — Assessment & Plan Note (Signed)
TSH 1.386 09/12/13 06/07/14 TSH 1.302 Takes Levothyroxine 50mcg po daily.    

## 2014-09-03 NOTE — Assessment & Plan Note (Signed)
diet modified to Nectar thick liquid and Purred diet. Better with Omeprazole 20mg  daily and Carafate bid. Less c/o nausea

## 2014-09-03 NOTE — Assessment & Plan Note (Signed)
Stable, continue Miralax daily and Senokot S II qod  

## 2014-09-03 NOTE — Assessment & Plan Note (Signed)
better with Prozac 40mg since 05/01/13 and off prn Ativan 0.5mg q8hr     

## 2014-10-01 ENCOUNTER — Non-Acute Institutional Stay (SKILLED_NURSING_FACILITY): Payer: Medicare Other | Admitting: Nurse Practitioner

## 2014-10-01 ENCOUNTER — Encounter: Payer: Self-pay | Admitting: Nurse Practitioner

## 2014-10-01 DIAGNOSIS — E1142 Type 2 diabetes mellitus with diabetic polyneuropathy: Secondary | ICD-10-CM

## 2014-10-01 DIAGNOSIS — M792 Neuralgia and neuritis, unspecified: Secondary | ICD-10-CM

## 2014-10-01 DIAGNOSIS — F0390 Unspecified dementia without behavioral disturbance: Secondary | ICD-10-CM

## 2014-10-01 DIAGNOSIS — K2100 Gastro-esophageal reflux disease with esophagitis, without bleeding: Secondary | ICD-10-CM

## 2014-10-01 DIAGNOSIS — F324 Major depressive disorder, single episode, in partial remission: Secondary | ICD-10-CM

## 2014-10-01 DIAGNOSIS — R609 Edema, unspecified: Secondary | ICD-10-CM

## 2014-10-01 DIAGNOSIS — K5900 Constipation, unspecified: Secondary | ICD-10-CM

## 2014-10-01 DIAGNOSIS — E039 Hypothyroidism, unspecified: Secondary | ICD-10-CM

## 2014-10-01 NOTE — Assessment & Plan Note (Signed)
diet modified to Nectar thick liquid and Purred diet. Better with Omeprazole 20mg  daily and off Carafate bid. Less c/o nausea

## 2014-10-01 NOTE — Assessment & Plan Note (Signed)
Stable, continue Miralax daily and Senokot S II qod

## 2014-10-01 NOTE — Assessment & Plan Note (Signed)
Pain is managed with Lyrica 75mg  nightly. Mainly in her left leg.

## 2014-10-01 NOTE — Assessment & Plan Note (Signed)
Hgb A1c 6.0 08/15/13 05/15/14 Hgb A1c 6.0 Continue Metformin 500mg  bid and Glipizide 5mg  bid.

## 2014-10-01 NOTE — Assessment & Plan Note (Signed)
TSH 1.386 09/12/13 06/07/14 TSH 1.302 Takes Levothyroxine 100mcg po daily.

## 2014-10-01 NOTE — Assessment & Plan Note (Signed)
Stabilized on Namenda

## 2014-10-01 NOTE — Assessment & Plan Note (Signed)
Left ankle> the right, chronic, no significant weight change

## 2014-10-01 NOTE — Progress Notes (Signed)
Patient ID: Sonya Garrett, female   DOB: 11/06/1928, 80 y.o.   MRN: [id number]   Code Status: DNR    Chief Complaint  Patient presents with  . Medical Management of Chronic Issues    HPI: Patient is a 80 y.o. female seen in the SNF at [facility] today for evaluation of chronic medical conditions.  Problem List Items Addressed This Visit    Reflux esophagitis    diet modified to Nectar thick liquid and Purred diet. Better with Omeprazole 20mg  daily and off Carafate bid. Less c/o nausea     Neuralgia and neuritis - Primary    Pain is managed with Lyrica 75mg  nightly. Mainly in her left leg.       Major depressive disorder, single episode          Hypothyroidism    TSH 1.386 09/12/13 06/07/14 TSH 1.302 Takes Levothyroxine 69mcg po daily.       Edema    Left ankle> the right, chronic, no significant weight change    Diabetes mellitus (Chronic)    Hgb A1c 6.0 08/15/13 05/15/14 Hgb A1c 6.0 Continue Metformin 500mg  bid and Glipizide 5mg  bid.      Dementia, in, senility    Stabilized on Namenda       Constipation    Stable, continue Miralax daily and Senokot S II qod        Review of Systems:  Review of Systems  Constitutional: Negative for fever, chills, weight loss, malaise/fatigue and diaphoresis.  HENT: Positive for hearing loss. Negative for congestion, ear pain and sore throat.   Eyes: Negative for pain, discharge and redness.  Respiratory: Negative for cough, sputum production, shortness of breath and wheezing.   Cardiovascular: Positive for leg swelling (chronic in ankles, L>R). Negative for chest pain, palpitations, orthopnea, claudication and PND.  Gastrointestinal: Negative for heartburn, nausea, vomiting, abdominal pain, diarrhea and constipation.       Hemorrhoids-irritated  Genitourinary: Positive for frequency (chronic). Negative for dysuria, urgency and flank pain.  Musculoskeletal: Positive for back pain, joint pain and myalgias.  Negative for neck pain.  Skin: Negative for itching and rash.  Neurological: Positive for focal weakness (left sided weakness grip strength 4-5/5). Negative for dizziness, tingling, tremors, speech change, seizures, loss of consciousness, weakness and headaches.  Endo/Heme/Allergies: Negative for environmental allergies and polydipsia. Does not bruise/bleed easily.  Psychiatric/Behavioral: Positive for depression and memory loss. Negative for hallucinations. The patient is nervous/anxious. The patient does not have insomnia.      Past Medical History  Diagnosis Date  . Thyroid disease hypotyroidism  . Diabetes mellitus without complication   . Anemia   . Depression   . Vitamin D deficiency   . Sleep apnea   . Hypertension   . Osteoarthritis   . GERD (gastroesophageal reflux disease)    Past Surgical History  Procedure Laterality Date  . Brain meningioma excision    . Cholecystectomy    . Tonsillectomy    . Tracheostomy     Social History:   reports that she has never smoked. She has never used smokeless tobacco. She reports that she does not drink alcohol or use illicit drugs.  Family History  Problem Relation Age of Onset  . Diabetes Mother     Medications: Patient's Medications  New Prescriptions   No medications on file  Previous Medications   ACETAMINOPHEN (TYLENOL) 325 MG TABLET    Take 650 mg by mouth every 6 (six) hours as needed for pain.  ALBUTEROL (PROVENTIL) (5 MG/ML) 0.5% NEBULIZER SOLUTION    Take 0.5 mLs (2.5 mg total) by nebulization every 2 (two) hours as needed for wheezing or shortness of breath.   ALBUTEROL (PROVENTIL) (5 MG/ML) 0.5% NEBULIZER SOLUTION    Take 0.5 mLs (2.5 mg total) by nebulization every 6 (six) hours.   CALCIUM CARBONATE (TUMS - DOSED IN MG ELEMENTAL CALCIUM) 500 MG CHEWABLE TABLET    Chew 1 tablet by mouth 2 (two) times daily as needed for heartburn.   CHOLECALCIFEROL (VITAMIN D-3) 1000 UNITS CAPS    Take 1,000 Units by mouth daily.     FLUOXETINE (PROZAC) 40 MG CAPSULE    Take 40 mg by mouth daily.   FOLIC ACID (FOLVITE) 1 MG TABLET    Take 1 mg by mouth daily.   GLIPIZIDE (GLUCOTROL) 5 MG TABLET    Take 5 mg by mouth 2 (two) times daily before a meal.   HYDROXYPROPYL METHYLCELLULOSE (ISOPTO TEARS) 2.5 % OPHTHALMIC SOLUTION    Place 1 drop into both eyes 2 (two) times daily.   IPRATROPIUM (ATROVENT) 0.02 % NEBULIZER SOLUTION    Take 2.5 mLs (0.5 mg total) by nebulization every 6 (six) hours.   LEVOFLOXACIN (LEVAQUIN) 500 MG TABLET    Take 1 tablet (500 mg total) by mouth daily.   LEVOTHYROXINE (SYNTHROID, LEVOTHROID) 50 MCG TABLET    Take 50 mcg by mouth daily. Take 1 tab by mouth every day. * Check pulse weekly on Monday*   MEMANTINE (NAMENDA) 10 MG TABLET    Take 10 mg by mouth 2 (two) times daily.   METFORMIN (GLUCOPHAGE) 500 MG TABLET    Take 500 mg by mouth 2 (two) times daily with a meal. Take with food.   NYSTATIN (MYCOSTATIN/NYSTOP) 100000 UNIT/GM POWD    Apply 1 g topically 2 (two) times daily as needed (applied to skin folds twice daily as needed for rash).   NYSTATIN-TRIAMCINOLONE (MYCOLOG II) CREAM    Apply 1 application topically at bedtime. To perineal area   OMEPRAZOLE (PRILOSEC) 20 MG CAPSULE    Take 20 mg by mouth daily.   POLYETHYLENE GLYCOL (MIRALAX / GLYCOLAX) PACKET    Take 17 g by mouth daily. Fill cap to 17 gm mark, mix with 8 ounces of water and take by mouth every day.   PREDNISONE (DELTASONE) 10 MG TABLET    Take 3 tabs daily for 3 days, then 2 tabs daily for 3 days, then 1 tab daily for 3 days, then stop.   PREGABALIN (LYRICA) 75 MG CAPSULE    Take 1 capsule (75 mg total) by mouth at bedtime.   SACCHAROMYCES BOULARDII (FLORASTOR) 250 MG CAPSULE    Take 250 mg by mouth 2 (two) times daily.   SENNA-DOCUSATE (SENOKOT-S) 8.6-50 MG PER TABLET    Take 2 tablets by mouth every other day.   SUCRALFATE (CARAFATE) 1 G TABLET    Take 1 tablet (1 g total) by mouth 2 (two) times daily.   VITAMIN B-12  (CYANOCOBALAMIN) 1000 MCG TABLET    Take 1,000 mcg by mouth every evening.  Modified Medications   No medications on file  Discontinued Medications   No medications on file     Physical Exam: Physical Exam  Constitutional: She is oriented to person, place, and time. She appears well-developed and well-nourished.  HENT:  Head: Normocephalic and atraumatic.  Eyes: Conjunctivae and EOM are normal. Pupils are equal, round, and reactive to light.  Neck: Normal range of motion. Neck  supple. No JVD present. No thyromegaly present.  Cardiovascular: Normal rate, regular rhythm and normal heart sounds.   No murmur heard. Pulmonary/Chest: Effort normal. No respiratory distress. She has no wheezes. She has rales (bibasilar dry rales. ). She exhibits no tenderness.  Abdominal: Soft. Bowel sounds are normal. She exhibits no distension. There is no tenderness.  Musculoskeletal: Normal range of motion. She exhibits edema (ankle L>R). She exhibits no tenderness.  Lymphadenopathy:    She has no cervical adenopathy.  Neurological: She is alert and oriented to person, place, and time. She has normal reflexes. No cranial nerve deficit. She exhibits abnormal muscle tone (left sided is weaker than the right with muscle strength 5/5). Coordination abnormal.  Left sided weakness even if grip strength 5/5  Skin: No rash noted.  Surgical incision behind left ear  Psychiatric: Thought content normal. Her mood appears anxious. Her affect is angry and inappropriate. Her affect is not blunt and not labile. She is agitated. She is not aggressive, not hyperactive, not slowed, not withdrawn, not actively hallucinating and not combative. Thought content is not paranoid and not delusional. Cognition and memory are impaired. She expresses impulsivity and inappropriate judgment. She does not exhibit a depressed mood. She expresses no homicidal ideation. She expresses no homicidal plans. She exhibits abnormal recent memory. She  exhibits normal remote memory. She is attentive.    Filed Vitals:   10/01/14 1246  BP: 128/68  Pulse: 70  Temp: 97.7 F (36.5 C)  TempSrc: Tympanic  Resp: 18   Labs reviewed: Basic Metabolic Panel:  Recent Labs  12/28/13 06/07/14  NA 137 138  K 3.7 3.6  BUN 11 11  CREATININE 0.9 0.8  TSH  --  1.30   Liver Function Tests:  Recent Labs  12/28/13 06/07/14  AST 9* 12*  ALT 8 10  ALKPHOS 70 93   CBC:  Recent Labs  12/28/13 01/02/14 06/07/14  WBC 11.1 11.6 7.6  HGB 9.6* 10.9* 11.1*  HCT 28* 32* 32*  PLT 256 265 233   Past Procedures:  12/27/13 CXR no cardiomegaly, pulmonary vascular congestion or pleural effusion, patchy left basilar atelectasis or pneumonia unchanged.   Assessment/Plan Diabetes mellitus Hgb A1c 6.0 08/15/13 05/15/14 Hgb A1c 6.0 Continue Metformin 500mg  bid and Glipizide 5mg  bid.    Hypothyroidism TSH 1.386 09/12/13 06/07/14 TSH 1.302 Takes Levothyroxine 3mcg po daily.     Constipation Stable, continue Miralax daily and Senokot S II qod   Reflux esophagitis diet modified to Nectar thick liquid and Purred diet. Better with Omeprazole 20mg  daily and off Carafate bid. Less c/o nausea   Dementia, in, senility Stabilized on Namenda     Major depressive disorder, single episode     Edema Left ankle> the right, chronic, no significant weight change  Neuralgia and neuritis Pain is managed with Lyrica 75mg  nightly. Mainly in her left leg.       Family/ Staff Communication: observe the patient  Goals of Care: SNF  Labs/tests ordered: none

## 2014-10-29 ENCOUNTER — Encounter: Payer: Self-pay | Admitting: Nurse Practitioner

## 2014-10-29 ENCOUNTER — Non-Acute Institutional Stay (SKILLED_NURSING_FACILITY): Payer: Medicare Other | Admitting: Nurse Practitioner

## 2014-10-29 DIAGNOSIS — K5900 Constipation, unspecified: Secondary | ICD-10-CM

## 2014-10-29 DIAGNOSIS — R609 Edema, unspecified: Secondary | ICD-10-CM

## 2014-10-29 DIAGNOSIS — E039 Hypothyroidism, unspecified: Secondary | ICD-10-CM

## 2014-10-29 DIAGNOSIS — E118 Type 2 diabetes mellitus with unspecified complications: Secondary | ICD-10-CM

## 2014-10-29 DIAGNOSIS — F0390 Unspecified dementia without behavioral disturbance: Secondary | ICD-10-CM

## 2014-10-29 DIAGNOSIS — F324 Major depressive disorder, single episode, in partial remission: Secondary | ICD-10-CM

## 2014-10-29 DIAGNOSIS — M792 Neuralgia and neuritis, unspecified: Secondary | ICD-10-CM

## 2014-10-29 NOTE — Assessment & Plan Note (Signed)
Stabilized on Namenda

## 2014-10-29 NOTE — Assessment & Plan Note (Signed)
Left ankle> the right, chronic and trace, no significant weight change

## 2014-10-29 NOTE — Assessment & Plan Note (Signed)
better with Prozac 40mg  since 05/01/13 and off prn Ativan 0.5mg  q8hr

## 2014-10-29 NOTE — Assessment & Plan Note (Signed)
Pain is managed with Lyrica 75mg  nightly. Mainly in her left leg.

## 2014-10-29 NOTE — Progress Notes (Deleted)
Patient ID: Sonya Garrett, female   DOB: 07-29-1929, 81 y.o.   MRN: [id number] Patient ID: Sonya Garrett, female   DOB: [birth year], 81 y.o.   MRN: [id number]   Code Status: DNR    Chief Complaint  Patient presents with  . Medical Management of Chronic Issues    HPI: Patient is a 81 y.o. female seen in the SNF at [facility] today for evaluation of chronic medical conditions.  Problem List Items Addressed This Visit    None      Review of Systems:  Review of Systems  Constitutional: Negative for fever, chills, weight loss, malaise/fatigue and diaphoresis.  HENT: Positive for hearing loss. Negative for congestion, ear pain and sore throat.   Eyes: Negative for pain, discharge and redness.  Respiratory: Negative for cough, sputum production, shortness of breath and wheezing.   Cardiovascular: Positive for leg swelling (chronic in ankles, L>R). Negative for chest pain, palpitations, orthopnea, claudication and PND.  Gastrointestinal: Negative for heartburn, nausea, vomiting, abdominal pain, diarrhea and constipation.       Hemorrhoids-irritated  Genitourinary: Positive for frequency (chronic). Negative for dysuria, urgency and flank pain.  Musculoskeletal: Positive for back pain, joint pain and myalgias. Negative for neck pain.  Skin: Negative for itching and rash.  Neurological: Positive for focal weakness (left sided weakness grip strength 4-5/5). Negative for dizziness, tingling, tremors, speech change, seizures, loss of consciousness, weakness and headaches.  Endo/Heme/Allergies: Negative for environmental allergies and polydipsia. Does not bruise/bleed easily.  Psychiatric/Behavioral: Positive for depression and memory loss. Negative for hallucinations. The patient is nervous/anxious. The patient does not have insomnia.      Past Medical History  Diagnosis Date  . Thyroid disease hypotyroidism  . Diabetes mellitus without complication   . Anemia   . Depression     . Vitamin D deficiency   . Sleep apnea   . Hypertension   . Osteoarthritis   . GERD (gastroesophageal reflux disease)    Past Surgical History  Procedure Laterality Date  . Brain meningioma excision    . Cholecystectomy    . Tonsillectomy    . Tracheostomy     Social History:   reports that she has never smoked. She has never used smokeless tobacco. She reports that she does not drink alcohol or use illicit drugs.  Family History  Problem Relation Age of Onset  . Diabetes Mother     Medications: Patient's Medications  New Prescriptions   No medications on file  Previous Medications   ACETAMINOPHEN (TYLENOL) 325 MG TABLET    Take 650 mg by mouth every 6 (six) hours as needed for pain.   ALBUTEROL (PROVENTIL) (5 MG/ML) 0.5% NEBULIZER SOLUTION    Take 0.5 mLs (2.5 mg total) by nebulization every 2 (two) hours as needed for wheezing or shortness of breath.   ALBUTEROL (PROVENTIL) (5 MG/ML) 0.5% NEBULIZER SOLUTION    Take 0.5 mLs (2.5 mg total) by nebulization every 6 (six) hours.   CALCIUM CARBONATE (TUMS - DOSED IN MG ELEMENTAL CALCIUM) 500 MG CHEWABLE TABLET    Chew 1 tablet by mouth 2 (two) times daily as needed for heartburn.   CHOLECALCIFEROL (VITAMIN D-3) 1000 UNITS CAPS    Take 1,000 Units by mouth daily.   FLUOXETINE (PROZAC) 40 MG CAPSULE    Take 40 mg by mouth daily.   FOLIC ACID (FOLVITE) 1 MG TABLET    Take 1 mg by mouth daily.   GLIPIZIDE (GLUCOTROL) 5 MG TABLET  Take 5 mg by mouth 2 (two) times daily before a meal.   HYDROXYPROPYL METHYLCELLULOSE (ISOPTO TEARS) 2.5 % OPHTHALMIC SOLUTION    Place 1 drop into both eyes 2 (two) times daily.   IPRATROPIUM (ATROVENT) 0.02 % NEBULIZER SOLUTION    Take 2.5 mLs (0.5 mg total) by nebulization every 6 (six) hours.   LEVOFLOXACIN (LEVAQUIN) 500 MG TABLET    Take 1 tablet (500 mg total) by mouth daily.   LEVOTHYROXINE (SYNTHROID, LEVOTHROID) 50 MCG TABLET    Take 50 mcg by mouth daily. Take 1 tab by mouth every day. * Check  pulse weekly on Monday*   MEMANTINE (NAMENDA) 10 MG TABLET    Take 10 mg by mouth 2 (two) times daily.   METFORMIN (GLUCOPHAGE) 500 MG TABLET    Take 500 mg by mouth 2 (two) times daily with a meal. Take with food.   NYSTATIN (MYCOSTATIN/NYSTOP) 100000 UNIT/GM POWD    Apply 1 g topically 2 (two) times daily as needed (applied to skin folds twice daily as needed for rash).   NYSTATIN-TRIAMCINOLONE (MYCOLOG II) CREAM    Apply 1 application topically at bedtime. To perineal area   OMEPRAZOLE (PRILOSEC) 20 MG CAPSULE    Take 20 mg by mouth daily.   POLYETHYLENE GLYCOL (MIRALAX / GLYCOLAX) PACKET    Take 17 g by mouth daily. Fill cap to 17 gm mark, mix with 8 ounces of water and take by mouth every day.   PREDNISONE (DELTASONE) 10 MG TABLET    Take 3 tabs daily for 3 days, then 2 tabs daily for 3 days, then 1 tab daily for 3 days, then stop.   PREGABALIN (LYRICA) 75 MG CAPSULE    Take 1 capsule (75 mg total) by mouth at bedtime.   SACCHAROMYCES BOULARDII (FLORASTOR) 250 MG CAPSULE    Take 250 mg by mouth 2 (two) times daily.   SENNA-DOCUSATE (SENOKOT-S) 8.6-50 MG PER TABLET    Take 2 tablets by mouth every other day.   SUCRALFATE (CARAFATE) 1 G TABLET    Take 1 tablet (1 g total) by mouth 2 (two) times daily.   VITAMIN B-12 (CYANOCOBALAMIN) 1000 MCG TABLET    Take 1,000 mcg by mouth every evening.  Modified Medications   No medications on file  Discontinued Medications   No medications on file     Physical Exam: Physical Exam  Constitutional: She is oriented to person, place, and time. She appears well-developed and well-nourished.  HENT:  Head: Normocephalic and atraumatic.  Eyes: Conjunctivae and EOM are normal. Pupils are equal, round, and reactive to light.  Neck: Normal range of motion. Neck supple. No JVD present. No thyromegaly present.  Cardiovascular: Normal rate, regular rhythm and normal heart sounds.   No murmur heard. Pulmonary/Chest: Effort normal. No respiratory distress. She  has no wheezes. She has rales (bibasilar dry rales. ). She exhibits no tenderness.  Abdominal: Soft. Bowel sounds are normal. She exhibits no distension. There is no tenderness.  Musculoskeletal: Normal range of motion. She exhibits edema (ankle L>R). She exhibits no tenderness.  Lymphadenopathy:    She has no cervical adenopathy.  Neurological: She is alert and oriented to person, place, and time. She has normal reflexes. No cranial nerve deficit. She exhibits abnormal muscle tone (left sided is weaker than the right with muscle strength 5/5). Coordination abnormal.  Left sided weakness even if grip strength 5/5  Skin: No rash noted.  Surgical incision behind left ear  Psychiatric: Thought content normal.  Her mood appears anxious. Her affect is angry and inappropriate. Her affect is not blunt and not labile. She is agitated. She is not aggressive, not hyperactive, not slowed, not withdrawn, not actively hallucinating and not combative. Thought content is not paranoid and not delusional. Cognition and memory are impaired. She expresses impulsivity and inappropriate judgment. She does not exhibit a depressed mood. She expresses no homicidal ideation. She expresses no homicidal plans. She exhibits abnormal recent memory. She exhibits normal remote memory. She is attentive.    Filed Vitals:   10/29/14 1256  BP: 120/60  Pulse: 68  Temp: 97.4 F (36.3 C)  TempSrc: Tympanic  Resp: 14   Labs reviewed: Basic Metabolic Panel:  Recent Labs  12/28/13 06/07/14  NA 137 138  K 3.7 3.6  BUN 11 11  CREATININE 0.9 0.8  TSH  --  1.30   Liver Function Tests:  Recent Labs  12/28/13 06/07/14  AST 9* 12*  ALT 8 10  ALKPHOS 70 93   CBC:  Recent Labs  12/28/13 01/02/14 06/07/14  WBC 11.1 11.6 7.6  HGB 9.6* 10.9* 11.1*  HCT 28* 32* 32*  PLT 256 265 233   Past Procedures:  12/27/13 CXR no cardiomegaly, pulmonary vascular congestion or pleural effusion, patchy left basilar atelectasis or  pneumonia unchanged.   Assessment/Plan No problem-specific assessment & plan notes found for this encounter.   Family/ Staff Communication: observe the patient  Goals of Care: SNF  Labs/tests ordered: none

## 2014-10-29 NOTE — Progress Notes (Signed)
Patient ID: Sonya Garrett, female   DOB: September 20, 1929, 81 y.o.   MRN: [id number]   Code Status: DNR  Allergies  Allergen Reactions  . Codeine Phosphate     unknown  . Darvocet [Propoxyphene N-Acetaminophen]     unknown  . Ibuprofen     unknown  . Prozac [Fluoxetine Hcl]     unknown  . Reglan [Metoclopramide]     unknown  . Tofranil [Imipramine Hcl]     unknown    Chief Complaint  Patient presents with  . Medical Management of Chronic Issues    HPI: Patient is a 81 y.o. female seen in the SNF at [facility] today for evaluation of other chronic medical conditions.  Problem List Items Addressed This Visit    Neuralgia and neuritis - Primary    Pain is managed with Lyrica 75mg  nightly. Mainly in her left leg.         Major depressive disorder, single episode    better with Prozac 40mg  since 05/01/13 and off prn Ativan 0.5mg  q8hr          Hypothyroidism    TSH 1.386 09/12/13 06/07/14 TSH 1.302 Takes Levothyroxine 65mcg po daily.         Edema    Left ankle> the right, chronic and trace, no significant weight change       Diabetes mellitus (Chronic)    Hgb A1c 6.0 08/15/13 05/15/14 Hgb A1c 6.0 Continue Metformin 500mg  bid and Glipizide 5mg  bid.         Dementia, in, senility    Stabilized on Namenda        Constipation    Stable, continue Miralax daily and Senokot S II qod          Review of Systems:  Review of Systems  Constitutional: Negative for fever, chills, weight loss, malaise/fatigue and diaphoresis.  HENT: Positive for hearing loss. Negative for congestion, ear discharge, ear pain, nosebleeds, sore throat and tinnitus.   Eyes: Negative for blurred vision, double vision, photophobia, pain, discharge and redness.  Respiratory: Positive for cough. Negative for sputum production, shortness of breath, wheezing and stridor.        Occasionally associated with swallowing.   Cardiovascular: Positive for leg swelling. Negative for  chest pain, palpitations, orthopnea, claudication and PND.       Trace  Gastrointestinal: Negative for heartburn, nausea, vomiting, abdominal pain, diarrhea, constipation, blood in stool and melena.  Genitourinary: Positive for frequency. Negative for dysuria, urgency, hematuria and flank pain.  Musculoskeletal: Positive for joint pain. Negative for myalgias, back pain, falls and neck pain.       Knee pain  Skin: Negative for itching and rash.  Neurological: Negative for dizziness, tingling, tremors, sensory change, speech change, focal weakness, seizures, loss of consciousness, weakness and headaches.  Endo/Heme/Allergies: Negative for environmental allergies and polydipsia. Does not bruise/bleed easily.  Psychiatric/Behavioral: Positive for memory loss. Negative for depression, suicidal ideas and substance abuse. The patient is nervous/anxious. The patient does not have insomnia.      Past Medical History  Diagnosis Date  . Thyroid disease hypotyroidism  . Diabetes mellitus without complication   . Anemia   . Depression   . Vitamin D deficiency   . Sleep apnea   . Hypertension   . Osteoarthritis   . GERD (gastroesophageal reflux disease)    Past Surgical History  Procedure Laterality Date  . Brain meningioma excision    . Cholecystectomy    . Tonsillectomy    .  Tracheostomy     Social History:   reports that she has never smoked. She has never used smokeless tobacco. She reports that she does not drink alcohol or use illicit drugs.  Family History  Problem Relation Age of Onset  . Diabetes Mother     Medications: Patient's Medications  New Prescriptions   No medications on file  Previous Medications   ACETAMINOPHEN (TYLENOL) 325 MG TABLET    Take 650 mg by mouth every 6 (six) hours as needed for pain.   ALBUTEROL (PROVENTIL) (5 MG/ML) 0.5% NEBULIZER SOLUTION    Take 0.5 mLs (2.5 mg total) by nebulization every 2 (two) hours as needed for wheezing or shortness of  breath.   ALBUTEROL (PROVENTIL) (5 MG/ML) 0.5% NEBULIZER SOLUTION    Take 0.5 mLs (2.5 mg total) by nebulization every 6 (six) hours.   CALCIUM CARBONATE (TUMS - DOSED IN MG ELEMENTAL CALCIUM) 500 MG CHEWABLE TABLET    Chew 1 tablet by mouth 2 (two) times daily as needed for heartburn.   CHOLECALCIFEROL (VITAMIN D-3) 1000 UNITS CAPS    Take 1,000 Units by mouth daily.   FLUOXETINE (PROZAC) 40 MG CAPSULE    Take 40 mg by mouth daily.   FOLIC ACID (FOLVITE) 1 MG TABLET    Take 1 mg by mouth daily.   GLIPIZIDE (GLUCOTROL) 5 MG TABLET    Take 5 mg by mouth 2 (two) times daily before a meal.   HYDROXYPROPYL METHYLCELLULOSE (ISOPTO TEARS) 2.5 % OPHTHALMIC SOLUTION    Place 1 drop into both eyes 2 (two) times daily.   IPRATROPIUM (ATROVENT) 0.02 % NEBULIZER SOLUTION    Take 2.5 mLs (0.5 mg total) by nebulization every 6 (six) hours.   LEVOFLOXACIN (LEVAQUIN) 500 MG TABLET    Take 1 tablet (500 mg total) by mouth daily.   LEVOTHYROXINE (SYNTHROID, LEVOTHROID) 50 MCG TABLET    Take 50 mcg by mouth daily. Take 1 tab by mouth every day. * Check pulse weekly on Monday*   MEMANTINE (NAMENDA) 10 MG TABLET    Take 10 mg by mouth 2 (two) times daily.   METFORMIN (GLUCOPHAGE) 500 MG TABLET    Take 500 mg by mouth 2 (two) times daily with a meal. Take with food.   NYSTATIN (MYCOSTATIN/NYSTOP) 100000 UNIT/GM POWD    Apply 1 g topically 2 (two) times daily as needed (applied to skin folds twice daily as needed for rash).   NYSTATIN-TRIAMCINOLONE (MYCOLOG II) CREAM    Apply 1 application topically at bedtime. To perineal area   OMEPRAZOLE (PRILOSEC) 20 MG CAPSULE    Take 20 mg by mouth daily.   POLYETHYLENE GLYCOL (MIRALAX / GLYCOLAX) PACKET    Take 17 g by mouth daily. Fill cap to 17 gm mark, mix with 8 ounces of water and take by mouth every day.   PREDNISONE (DELTASONE) 10 MG TABLET    Take 3 tabs daily for 3 days, then 2 tabs daily for 3 days, then 1 tab daily for 3 days, then stop.   PREGABALIN (LYRICA) 75 MG  CAPSULE    Take 1 capsule (75 mg total) by mouth at bedtime.   SACCHAROMYCES BOULARDII (FLORASTOR) 250 MG CAPSULE    Take 250 mg by mouth 2 (two) times daily.   SENNA-DOCUSATE (SENOKOT-S) 8.6-50 MG PER TABLET    Take 2 tablets by mouth every other day.   SUCRALFATE (CARAFATE) 1 G TABLET    Take 1 tablet (1 g total) by mouth 2 (two) times  daily.   VITAMIN B-12 (CYANOCOBALAMIN) 1000 MCG TABLET    Take 1,000 mcg by mouth every evening.  Modified Medications   No medications on file  Discontinued Medications   No medications on file     Physical Exam: Physical Exam  Constitutional: She is oriented to person, place, and time. She appears well-developed and well-nourished.  HENT:  Head: Normocephalic and atraumatic.  Eyes: Conjunctivae and EOM are normal. Pupils are equal, round, and reactive to light.  Neck: Normal range of motion. Neck supple. No JVD present. No thyromegaly present.  Cardiovascular: Normal rate, regular rhythm and normal heart sounds.   No murmur heard. Pulmonary/Chest: Effort normal. No respiratory distress. She has no wheezes. She has rales (bibasilar dry rales. ). She exhibits no tenderness.  Abdominal: Soft. Bowel sounds are normal. She exhibits no distension. There is no tenderness.  Musculoskeletal: Normal range of motion. She exhibits edema (ankle L>R). She exhibits no tenderness.  Trace BLE  Lymphadenopathy:    She has no cervical adenopathy.  Neurological: She is alert and oriented to person, place, and time. She has normal reflexes. No cranial nerve deficit. She exhibits abnormal muscle tone (left sided is weaker than the right with muscle strength 5/5). Coordination abnormal.  Left sided weakness even if grip strength 5/5  Skin: No rash noted.  Surgical incision behind left ear  Psychiatric: Thought content normal. Her mood appears anxious. Her affect is angry and inappropriate. Her affect is not blunt and not labile. She is agitated. She is not aggressive, not  hyperactive, not slowed, not withdrawn, not actively hallucinating and not combative. Thought content is not paranoid and not delusional. Cognition and memory are impaired. She expresses impulsivity and inappropriate judgment. She does not exhibit a depressed mood. She expresses no homicidal ideation. She expresses no homicidal plans. She exhibits abnormal recent memory. She exhibits normal remote memory. She is attentive.    Filed Vitals:   10/29/14 1256  BP: 120/60  Pulse: 68  Temp: 97.4 F (36.3 C)  TempSrc: Tympanic  Resp: 14      Labs reviewed: Basic Metabolic Panel:  Recent Labs  12/28/13 06/07/14  NA 137 138  K 3.7 3.6  BUN 11 11  CREATININE 0.9 0.8  TSH  --  1.30   Liver Function Tests:  Recent Labs  12/28/13 06/07/14  AST 9* 12*  ALT 8 10  ALKPHOS 70 93   No results for input(s): LIPASE, AMYLASE in the last 8760 hours. No results for input(s): AMMONIA in the last 8760 hours. CBC:  Recent Labs  12/28/13 01/02/14 06/07/14  WBC 11.1 11.6 7.6  HGB 9.6* 10.9* 11.1*  HCT 28* 32* 32*  PLT 256 265 233   Lipid Panel: No results for input(s): CHOL, HDL, LDLCALC, TRIG, CHOLHDL, LDLDIRECT in the last 8760 hours.  Past Procedures:  none recently.   Assessment/Plan Neuralgia and neuritis Pain is managed with Lyrica 75mg  nightly. Mainly in her left leg.      Major depressive disorder, single episode better with Prozac 40mg  since 05/01/13 and off prn Ativan 0.5mg  q8hr       Hypothyroidism TSH 1.386 09/12/13 06/07/14 TSH 1.302 Takes Levothyroxine 27mcg po daily.      Edema Left ankle> the right, chronic and trace, no significant weight change    Diabetes mellitus Hgb A1c 6.0 08/15/13 05/15/14 Hgb A1c 6.0 Continue Metformin 500mg  bid and Glipizide 5mg  bid.      Dementia, in, senility Stabilized on Namenda  Constipation Stable, continue Miralax daily and Senokot S II qod      Family/ Staff Communication: observe the  patient  Goals of Care: SNF  Labs/tests ordered: None

## 2014-10-29 NOTE — Assessment & Plan Note (Signed)
Hgb A1c 6.0 08/15/13 05/15/14 Hgb A1c 6.0 Continue Metformin 500mg  bid and Glipizide 5mg  bid.

## 2014-10-29 NOTE — Assessment & Plan Note (Signed)
Stable, continue Miralax daily and Senokot S II qod

## 2014-10-29 NOTE — Assessment & Plan Note (Signed)
TSH 1.386 09/12/13 06/07/14 TSH 1.302 Takes Levothyroxine 74mcg po daily.

## 2014-11-14 ENCOUNTER — Non-Acute Institutional Stay (SKILLED_NURSING_FACILITY): Payer: Medicare Other | Admitting: Nurse Practitioner

## 2014-11-14 DIAGNOSIS — E039 Hypothyroidism, unspecified: Secondary | ICD-10-CM

## 2014-11-14 DIAGNOSIS — R609 Edema, unspecified: Secondary | ICD-10-CM

## 2014-11-14 DIAGNOSIS — F0390 Unspecified dementia without behavioral disturbance: Secondary | ICD-10-CM

## 2014-11-14 DIAGNOSIS — K2100 Gastro-esophageal reflux disease with esophagitis, without bleeding: Secondary | ICD-10-CM

## 2014-11-14 DIAGNOSIS — K5900 Constipation, unspecified: Secondary | ICD-10-CM

## 2014-11-14 DIAGNOSIS — F324 Major depressive disorder, single episode, in partial remission: Secondary | ICD-10-CM

## 2014-11-14 DIAGNOSIS — D649 Anemia, unspecified: Secondary | ICD-10-CM

## 2014-11-14 DIAGNOSIS — M792 Neuralgia and neuritis, unspecified: Secondary | ICD-10-CM

## 2014-11-14 NOTE — Assessment & Plan Note (Signed)
06/07/14 Hgb 11, takes Vit V72 and folic acid.

## 2014-11-14 NOTE — Assessment & Plan Note (Signed)
better with Prozac 40mg  since 05/01/13 and off prn Ativan 0.5mg  q8hr

## 2014-11-14 NOTE — Assessment & Plan Note (Signed)
Hgb A1c 6.0 08/15/13 05/15/14 Hgb A1c 6.0 Continue Metformin 500mg  bid and Glipizide 5mg  bid.

## 2014-11-14 NOTE — Assessment & Plan Note (Signed)
Left ankle> the right, chronic and trace, no significant weight change

## 2014-11-14 NOTE — Assessment & Plan Note (Signed)
Stable, continue Miralax daily and Senokot S II qod

## 2014-11-14 NOTE — Assessment & Plan Note (Signed)
TSH 1.386 09/12/13 06/07/14 TSH 1.302 Takes Levothyroxine 60mcg po daily.

## 2014-11-14 NOTE — Progress Notes (Signed)
Patient ID: Sonya Garrett, female   DOB: [birth year], 81 y.o.   MRN: [id number]   Code Status: DNR  Allergies  Allergen Reactions  . Codeine Phosphate     unknown  . Darvocet [Propoxyphene N-Acetaminophen]     unknown  . Ibuprofen     unknown  . Prozac [Fluoxetine Hcl]     unknown  . Reglan [Metoclopramide]     unknown  . Tofranil [Imipramine Hcl]     unknown    Chief Complaint  Patient presents with  . Medical Management of Chronic Issues    HPI: Patient is a 81 y.o. female seen in the SNF at [facility] today for evaluation of other chronic medical conditions.  Problem List Items Addressed This Visit    Reflux esophagitis    diet modified to Nectar thick liquid and Purred diet. Better with Omeprazole 20mg  daily and off Carafate bid.          Neuralgia and neuritis    Pain is managed with Lyrica 75mg  nightly. Mainly in her left leg.        Major depressive disorder, single episode    better with Prozac 40mg  since 05/01/13 and off prn Ativan 0.5mg  q8hr        Hypothyroidism    TSH 1.386 09/12/13 06/07/14 TSH 1.302 Takes Levothyroxine 19mcg po daily.         Edema    Left ankle> the right, chronic and trace, no significant weight change        Dementia, in, senility - Primary    11/13/14 MMSE 16/30, takes Namenda and resides SNF for care needs.        Constipation    Stable, continue Miralax daily and Senokot S II qod        Anemia    06/07/14 Hgb 11, takes Vit U13 and folic acid.           Review of Systems:  Review of Systems  Constitutional: Negative for fever, chills, weight loss, malaise/fatigue and diaphoresis.  HENT: Positive for hearing loss. Negative for congestion, ear discharge, ear pain, nosebleeds, sore throat and tinnitus.   Eyes: Negative for blurred vision, double vision, photophobia, pain, discharge and redness.  Respiratory: Negative for cough, hemoptysis, sputum production, shortness of breath, wheezing and  stridor.   Cardiovascular: Positive for leg swelling. Negative for chest pain, palpitations, orthopnea, claudication and PND.       Trace L>R  Gastrointestinal: Negative for heartburn, nausea, vomiting, abdominal pain, diarrhea, constipation, blood in stool and melena.  Genitourinary: Positive for frequency. Negative for dysuria, urgency, hematuria and flank pain.       Incontinent of bladder  Musculoskeletal: Positive for joint pain. Negative for myalgias, back pain, falls and neck pain.       Neuropathic pain in left leg  Skin: Negative for itching and rash.  Neurological: Positive for sensory change. Negative for dizziness, tingling, tremors, speech change, focal weakness, seizures, loss of consciousness, weakness and headaches.  Endo/Heme/Allergies: Negative for environmental allergies and polydipsia. Does not bruise/bleed easily.  Psychiatric/Behavioral: Positive for depression and memory loss. Negative for suicidal ideas, hallucinations and substance abuse. The patient is nervous/anxious. The patient does not have insomnia.      Past Medical History  Diagnosis Date  . Thyroid disease hypotyroidism  . Diabetes mellitus without complication   . Anemia   . Depression   . Vitamin D deficiency   . Sleep apnea   . Hypertension   .  Osteoarthritis   . GERD (gastroesophageal reflux disease)    Past Surgical History  Procedure Laterality Date  . Brain meningioma excision    . Cholecystectomy    . Tonsillectomy    . Tracheostomy     Social History:   reports that she has never smoked. She has never used smokeless tobacco. She reports that she does not drink alcohol or use illicit drugs.  Family History  Problem Relation Age of Onset  . Diabetes Mother     Medications: Patient's Medications  New Prescriptions   No medications on file  Previous Medications   ACETAMINOPHEN (TYLENOL) 325 MG TABLET    Take 650 mg by mouth every 6 (six) hours as needed for pain.   ALBUTEROL  (PROVENTIL) (5 MG/ML) 0.5% NEBULIZER SOLUTION    Take 0.5 mLs (2.5 mg total) by nebulization every 2 (two) hours as needed for wheezing or shortness of breath.   ALBUTEROL (PROVENTIL) (5 MG/ML) 0.5% NEBULIZER SOLUTION    Take 0.5 mLs (2.5 mg total) by nebulization every 6 (six) hours.   CALCIUM CARBONATE (TUMS - DOSED IN MG ELEMENTAL CALCIUM) 500 MG CHEWABLE TABLET    Chew 1 tablet by mouth 2 (two) times daily as needed for heartburn.   CHOLECALCIFEROL (VITAMIN D-3) 1000 UNITS CAPS    Take 1,000 Units by mouth daily.   FLUOXETINE (PROZAC) 40 MG CAPSULE    Take 40 mg by mouth daily.   FOLIC ACID (FOLVITE) 1 MG TABLET    Take 1 mg by mouth daily.   GLIPIZIDE (GLUCOTROL) 5 MG TABLET    Take 5 mg by mouth 2 (two) times daily before a meal.   HYDROXYPROPYL METHYLCELLULOSE (ISOPTO TEARS) 2.5 % OPHTHALMIC SOLUTION    Place 1 drop into both eyes 2 (two) times daily.   IPRATROPIUM (ATROVENT) 0.02 % NEBULIZER SOLUTION    Take 2.5 mLs (0.5 mg total) by nebulization every 6 (six) hours.   LEVOFLOXACIN (LEVAQUIN) 500 MG TABLET    Take 1 tablet (500 mg total) by mouth daily.   LEVOTHYROXINE (SYNTHROID, LEVOTHROID) 50 MCG TABLET    Take 50 mcg by mouth daily. Take 1 tab by mouth every day. * Check pulse weekly on Monday*   MEMANTINE (NAMENDA) 10 MG TABLET    Take 10 mg by mouth 2 (two) times daily.   METFORMIN (GLUCOPHAGE) 500 MG TABLET    Take 500 mg by mouth 2 (two) times daily with a meal. Take with food.   NYSTATIN (MYCOSTATIN/NYSTOP) 100000 UNIT/GM POWD    Apply 1 g topically 2 (two) times daily as needed (applied to skin folds twice daily as needed for rash).   NYSTATIN-TRIAMCINOLONE (MYCOLOG II) CREAM    Apply 1 application topically at bedtime. To perineal area   OMEPRAZOLE (PRILOSEC) 20 MG CAPSULE    Take 20 mg by mouth daily.   POLYETHYLENE GLYCOL (MIRALAX / GLYCOLAX) PACKET    Take 17 g by mouth daily. Fill cap to 17 gm mark, mix with 8 ounces of water and take by mouth every day.   PREDNISONE  (DELTASONE) 10 MG TABLET    Take 3 tabs daily for 3 days, then 2 tabs daily for 3 days, then 1 tab daily for 3 days, then stop.   PREGABALIN (LYRICA) 75 MG CAPSULE    Take 1 capsule (75 mg total) by mouth at bedtime.   SACCHAROMYCES BOULARDII (FLORASTOR) 250 MG CAPSULE    Take 250 mg by mouth 2 (two) times daily.   SENNA-DOCUSATE (  SENOKOT-S) 8.6-50 MG PER TABLET    Take 2 tablets by mouth every other day.   SUCRALFATE (CARAFATE) 1 G TABLET    Take 1 tablet (1 g total) by mouth 2 (two) times daily.   VITAMIN B-12 (CYANOCOBALAMIN) 1000 MCG TABLET    Take 1,000 mcg by mouth every evening.  Modified Medications   No medications on file  Discontinued Medications   No medications on file     Physical Exam: Physical Exam  Constitutional: She is oriented to person, place, and time. She appears well-developed and well-nourished.  HENT:  Head: Normocephalic and atraumatic.  Eyes: Conjunctivae and EOM are normal. Pupils are equal, round, and reactive to light.  Neck: Normal range of motion. Neck supple. No JVD present. No thyromegaly present.  Cardiovascular: Normal rate, regular rhythm and normal heart sounds.   No murmur heard. Pulmonary/Chest: Effort normal. No respiratory distress. She has no wheezes. She has rales (bibasilar dry rales. ). She exhibits no tenderness.  Abdominal: Soft. Bowel sounds are normal. She exhibits no distension. There is no tenderness.  Musculoskeletal: Normal range of motion. She exhibits edema (ankle L>R). She exhibits no tenderness.  Trace BLE  Lymphadenopathy:    She has no cervical adenopathy.  Neurological: She is alert and oriented to person, place, and time. She has normal reflexes. No cranial nerve deficit. She exhibits abnormal muscle tone (left sided is weaker than the right with muscle strength 5/5). Coordination abnormal.  Left sided weakness even if grip strength 5/5  Skin: No rash noted.  Surgical incision behind left ear  Psychiatric: Thought content  normal. Her mood appears anxious. Her affect is angry and inappropriate. Her affect is not blunt and not labile. She is agitated. She is not aggressive, not hyperactive, not slowed, not withdrawn, not actively hallucinating and not combative. Thought content is not paranoid and not delusional. Cognition and memory are impaired. She expresses impulsivity and inappropriate judgment. She does not exhibit a depressed mood. She expresses no homicidal ideation. She expresses no homicidal plans. She exhibits abnormal recent memory. She exhibits normal remote memory. She is attentive.    Filed Vitals:   11/14/14 1124  BP: 120/80  Pulse: 80  Temp: 98.2 F (36.8 C)  TempSrc: Tympanic  Resp: 16      Labs reviewed: Basic Metabolic Panel:  Recent Labs  12/28/13 06/07/14  NA 137 138  K 3.7 3.6  BUN 11 11  CREATININE 0.9 0.8  TSH  --  1.30   Liver Function Tests:  Recent Labs  12/28/13 06/07/14  AST 9* 12*  ALT 8 10  ALKPHOS 70 93   No results for input(s): LIPASE, AMYLASE in the last 8760 hours. No results for input(s): AMMONIA in the last 8760 hours. CBC:  Recent Labs  12/28/13 01/02/14 06/07/14  WBC 11.1 11.6 7.6  HGB 9.6* 10.9* 11.1*  HCT 28* 32* 32*  PLT 256 265 233   Lipid Panel: No results for input(s): CHOL, HDL, LDLCALC, TRIG, CHOLHDL, LDLDIRECT in the last 8760 hours.  Past Procedures:  none recently.   Assessment/Plan Dementia, in, senility 11/13/14 MMSE 16/30, takes Namenda and resides SNF for care needs.     Diabetes mellitus with neuropathy Hgb A1c 6.0 08/15/13 05/15/14 Hgb A1c 6.0 Continue Metformin 500mg  bid and Glipizide 5mg  bid.   Constipation Stable, continue Miralax daily and Senokot S II qod     Hypothyroidism TSH 1.386 09/12/13 06/07/14 TSH 1.302 Takes Levothyroxine 47mcg po daily.  Reflux esophagitis diet modified to Nectar thick liquid and Purred diet. Better with Omeprazole 20mg  daily and off Carafate bid.       Major  depressive disorder, single episode better with Prozac 40mg  since 05/01/13 and off prn Ativan 0.5mg  q8hr     Neuralgia and neuritis Pain is managed with Lyrica 75mg  nightly. Mainly in her left leg.     Edema Left ankle> the right, chronic and trace, no significant weight change     Anemia 06/07/14 Hgb 11, takes Vit L85 and folic acid.       Family/ Staff Communication: observe the patient  Goals of Care: SNF  Labs/tests ordered: None

## 2014-11-14 NOTE — Assessment & Plan Note (Signed)
diet modified to Nectar thick liquid and Purred diet. Better with Omeprazole 20mg  daily and off Carafate bid.

## 2014-11-14 NOTE — Assessment & Plan Note (Signed)
11/13/14 MMSE 16/30, takes Namenda and resides SNF for care needs.

## 2014-11-14 NOTE — Assessment & Plan Note (Signed)
Pain is managed with Lyrica 75mg  nightly. Mainly in her left leg.

## 2014-11-20 LAB — HEMOGLOBIN A1C: Hgb A1c MFr Bld: 5.3 % (ref 4.0–6.0)

## 2014-11-21 ENCOUNTER — Other Ambulatory Visit: Payer: Self-pay | Admitting: Nurse Practitioner

## 2014-11-21 DIAGNOSIS — E0840 Diabetes mellitus due to underlying condition with diabetic neuropathy, unspecified: Secondary | ICD-10-CM

## 2014-12-12 ENCOUNTER — Non-Acute Institutional Stay (SKILLED_NURSING_FACILITY): Payer: Medicare Other | Admitting: Nurse Practitioner

## 2014-12-12 ENCOUNTER — Encounter: Payer: Self-pay | Admitting: Nurse Practitioner

## 2014-12-12 DIAGNOSIS — E039 Hypothyroidism, unspecified: Secondary | ICD-10-CM

## 2014-12-12 DIAGNOSIS — E0840 Diabetes mellitus due to underlying condition with diabetic neuropathy, unspecified: Secondary | ICD-10-CM | POA: Diagnosis not present

## 2014-12-12 DIAGNOSIS — K5900 Constipation, unspecified: Secondary | ICD-10-CM

## 2014-12-12 DIAGNOSIS — K2100 Gastro-esophageal reflux disease with esophagitis, without bleeding: Secondary | ICD-10-CM

## 2014-12-12 DIAGNOSIS — R609 Edema, unspecified: Secondary | ICD-10-CM | POA: Diagnosis not present

## 2014-12-12 DIAGNOSIS — M792 Neuralgia and neuritis, unspecified: Secondary | ICD-10-CM | POA: Diagnosis not present

## 2014-12-12 DIAGNOSIS — F0390 Unspecified dementia without behavioral disturbance: Secondary | ICD-10-CM

## 2014-12-12 DIAGNOSIS — F324 Major depressive disorder, single episode, in partial remission: Secondary | ICD-10-CM

## 2014-12-12 NOTE — Assessment & Plan Note (Signed)
diet modified to Nectar thick liquid and Purred diet. Better with Omeprazole 20mg  daily and off Carafate bid.

## 2014-12-12 NOTE — Assessment & Plan Note (Signed)
Pain is managed with Lyrica 75mg  nightly. Mainly in her left leg.

## 2014-12-12 NOTE — Assessment & Plan Note (Signed)
11/13/14 MMSE 16/30, takes Namenda and resides SNF for care needs.

## 2014-12-12 NOTE — Assessment & Plan Note (Signed)
Stable, continue Miralax daily and Senokot S II qod

## 2014-12-12 NOTE — Assessment & Plan Note (Signed)
Left ankle> the right, chronic and trace, no significant weight change

## 2014-12-12 NOTE — Assessment & Plan Note (Signed)
better with Prozac 40mg  since 05/01/13 and off prn Ativan 0.5mg  q8hr

## 2014-12-12 NOTE — Assessment & Plan Note (Signed)
Hgb A1c 6.0 08/15/13 05/15/14 Hgb A1c 6.0 11/20/14 Hgb 5.3 Continue Metformin 500mg  bid and Glipizide 5mg  bid.

## 2014-12-12 NOTE — Assessment & Plan Note (Signed)
TSH 1.386 09/12/13 06/07/14 TSH 1.302 Takes Levothyroxine 29mcg po daily.

## 2014-12-12 NOTE — Progress Notes (Signed)
Patient ID: Sonya Garrett, female   DOB: 11-12-1928, 81 y.o.   MRN: [id number]   Code Status: DNR  Allergies  Allergen Reactions  . Codeine Phosphate     unknown  . Darvocet [Propoxyphene N-Acetaminophen]     unknown  . Ibuprofen     unknown  . Prozac [Fluoxetine Hcl]     unknown  . Reglan [Metoclopramide]     unknown  . Tofranil [Imipramine Hcl]     unknown    Chief Complaint  Patient presents with  . Medical Management of Chronic Issues    HPI: Patient is a 81 y.o. female seen in the SNF at [facility] today for evaluation of chronic medical conditions.  Problem List Items Addressed This Visit    Reflux esophagitis    diet modified to Nectar thick liquid and Purred diet. Better with Omeprazole 20mg  daily and off Carafate bid.          Neuralgia and neuritis    Pain is managed with Lyrica 75mg  nightly. Mainly in her left leg.         Major depressive disorder, single episode - Primary    better with Prozac 40mg  since 05/01/13 and off prn Ativan 0.5mg  q8hr        Hypothyroidism    TSH 1.386 09/12/13 06/07/14 TSH 1.302 Takes Levothyroxine 13mcg po daily.         Edema    Left ankle> the right, chronic and trace, no significant weight change         Diabetes mellitus with neuropathy    Hgb A1c 6.0 08/15/13 05/15/14 Hgb A1c 6.0 11/20/14 Hgb 5.3 Continue Metformin 500mg  bid and Glipizide 5mg  bid.       Dementia, in, senility    11/13/14 MMSE 16/30, takes Namenda and resides SNF for care needs.         Constipation    Stable, continue Miralax daily and Senokot S II qod           Review of Systems:  Review of Systems  Constitutional: Negative for fever, chills and diaphoresis.  HENT: Positive for hearing loss. Negative for congestion, ear discharge, ear pain, nosebleeds, sore throat and tinnitus.   Eyes: Negative for photophobia, pain, discharge and redness.  Respiratory: Negative for cough, shortness of breath, wheezing and  stridor.   Cardiovascular: Positive for leg swelling. Negative for chest pain and palpitations.       Trace L>R  Gastrointestinal: Negative for nausea, vomiting, abdominal pain, diarrhea, constipation and blood in stool.  Endocrine: Negative for polydipsia.  Genitourinary: Positive for frequency. Negative for dysuria, urgency, hematuria and flank pain.       Incontinent of bladder  Musculoskeletal: Negative for myalgias, back pain and neck pain.       Neuropathic pain in left leg  Skin: Negative for rash.  Allergic/Immunologic: Negative for environmental allergies.  Neurological: Negative for dizziness, tremors, seizures, weakness and headaches.  Hematological: Does not bruise/bleed easily.  Psychiatric/Behavioral: Negative for suicidal ideas and hallucinations. The patient is nervous/anxious.      Past Medical History  Diagnosis Date  . Thyroid disease hypotyroidism  . Diabetes mellitus without complication   . Anemia   . Depression   . Vitamin D deficiency   . Sleep apnea   . Hypertension   . Osteoarthritis   . GERD (gastroesophageal reflux disease)    Past Surgical History  Procedure Laterality Date  . Brain meningioma excision    . Cholecystectomy    .  Tonsillectomy    . Tracheostomy     Social History:   reports that she has never smoked. She has never used smokeless tobacco. She reports that she does not drink alcohol or use illicit drugs.  Family History  Problem Relation Age of Onset  . Diabetes Mother     Medications: Patient's Medications  New Prescriptions   No medications on file  Previous Medications   ACETAMINOPHEN (TYLENOL) 325 MG TABLET    Take 650 mg by mouth every 6 (six) hours as needed for pain.   ALBUTEROL (PROVENTIL) (5 MG/ML) 0.5% NEBULIZER SOLUTION    Take 0.5 mLs (2.5 mg total) by nebulization every 2 (two) hours as needed for wheezing or shortness of breath.   ALBUTEROL (PROVENTIL) (5 MG/ML) 0.5% NEBULIZER SOLUTION    Take 0.5 mLs (2.5 mg  total) by nebulization every 6 (six) hours.   CALCIUM CARBONATE (TUMS - DOSED IN MG ELEMENTAL CALCIUM) 500 MG CHEWABLE TABLET    Chew 1 tablet by mouth 2 (two) times daily as needed for heartburn.   CHOLECALCIFEROL (VITAMIN D-3) 1000 UNITS CAPS    Take 1,000 Units by mouth daily.   FLUOXETINE (PROZAC) 40 MG CAPSULE    Take 40 mg by mouth daily.   FOLIC ACID (FOLVITE) 1 MG TABLET    Take 1 mg by mouth daily.   GLIPIZIDE (GLUCOTROL) 5 MG TABLET    Take 5 mg by mouth 2 (two) times daily before a meal.   HYDROXYPROPYL METHYLCELLULOSE (ISOPTO TEARS) 2.5 % OPHTHALMIC SOLUTION    Place 1 drop into both eyes 2 (two) times daily.   IPRATROPIUM (ATROVENT) 0.02 % NEBULIZER SOLUTION    Take 2.5 mLs (0.5 mg total) by nebulization every 6 (six) hours.   LEVOFLOXACIN (LEVAQUIN) 500 MG TABLET    Take 1 tablet (500 mg total) by mouth daily.   LEVOTHYROXINE (SYNTHROID, LEVOTHROID) 50 MCG TABLET    Take 50 mcg by mouth daily. Take 1 tab by mouth every day. * Check pulse weekly on Monday*   MEMANTINE (NAMENDA) 10 MG TABLET    Take 10 mg by mouth 2 (two) times daily.   METFORMIN (GLUCOPHAGE) 500 MG TABLET    Take 500 mg by mouth 2 (two) times daily with a meal. Take with food.   NYSTATIN (MYCOSTATIN/NYSTOP) 100000 UNIT/GM POWD    Apply 1 g topically 2 (two) times daily as needed (applied to skin folds twice daily as needed for rash).   NYSTATIN-TRIAMCINOLONE (MYCOLOG II) CREAM    Apply 1 application topically at bedtime. To perineal area   OMEPRAZOLE (PRILOSEC) 20 MG CAPSULE    Take 20 mg by mouth daily.   POLYETHYLENE GLYCOL (MIRALAX / GLYCOLAX) PACKET    Take 17 g by mouth daily. Fill cap to 17 gm mark, mix with 8 ounces of water and take by mouth every day.   PREDNISONE (DELTASONE) 10 MG TABLET    Take 3 tabs daily for 3 days, then 2 tabs daily for 3 days, then 1 tab daily for 3 days, then stop.   PREGABALIN (LYRICA) 75 MG CAPSULE    Take 1 capsule (75 mg total) by mouth at bedtime.   SACCHAROMYCES BOULARDII  (FLORASTOR) 250 MG CAPSULE    Take 250 mg by mouth 2 (two) times daily.   SENNA-DOCUSATE (SENOKOT-S) 8.6-50 MG PER TABLET    Take 2 tablets by mouth every other day.   SUCRALFATE (CARAFATE) 1 G TABLET    Take 1 tablet (1 g total)  by mouth 2 (two) times daily.   VITAMIN B-12 (CYANOCOBALAMIN) 1000 MCG TABLET    Take 1,000 mcg by mouth every evening.  Modified Medications   No medications on file  Discontinued Medications   No medications on file     Physical Exam: Physical Exam  Constitutional: She is oriented to person, place, and time. She appears well-developed and well-nourished.  HENT:  Head: Normocephalic and atraumatic.  Eyes: Conjunctivae and EOM are normal. Pupils are equal, round, and reactive to light.  Neck: Normal range of motion. Neck supple. No JVD present. No thyromegaly present.  Cardiovascular: Normal rate, regular rhythm and normal heart sounds.   No murmur heard. Pulmonary/Chest: Effort normal. No respiratory distress. She has no wheezes. She has rales (bibasilar dry rales. ). She exhibits no tenderness.  Abdominal: Soft. Bowel sounds are normal. She exhibits no distension. There is no tenderness.  Musculoskeletal: Normal range of motion. She exhibits edema (ankle L>R). She exhibits no tenderness.  Trace BLE  Lymphadenopathy:    She has no cervical adenopathy.  Neurological: She is alert and oriented to person, place, and time. She has normal reflexes. No cranial nerve deficit. She exhibits abnormal muscle tone (left sided is weaker than the right with muscle strength 5/5). Coordination abnormal.  Left sided weakness even if grip strength 5/5  Skin: No rash noted.  Surgical incision behind left ear  Psychiatric: Thought content normal. Her mood appears anxious. Her affect is angry and inappropriate. Her affect is not blunt and not labile. She is agitated. She is not aggressive, not hyperactive, not slowed, not withdrawn, not actively hallucinating and not combative.  Thought content is not paranoid and not delusional. Cognition and memory are impaired. She expresses impulsivity and inappropriate judgment. She does not exhibit a depressed mood. She expresses no homicidal ideation. She expresses no homicidal plans. She exhibits abnormal recent memory. She exhibits normal remote memory. She is attentive.    Filed Vitals:   12/12/14 1517  BP: 136/74  Pulse: 76  Temp: 98.2 F (36.8 C)  TempSrc: Tympanic  Resp: 16      Labs reviewed: Basic Metabolic Panel:  Recent Labs  12/28/13 06/07/14  NA 137 138  K 3.7 3.6  BUN 11 11  CREATININE 0.9 0.8  TSH  --  1.30   Liver Function Tests:  Recent Labs  12/28/13 06/07/14  AST 9* 12*  ALT 8 10  ALKPHOS 70 93   No results for input(s): LIPASE, AMYLASE in the last 8760 hours. No results for input(s): AMMONIA in the last 8760 hours. CBC:  Recent Labs  12/28/13 01/02/14 06/07/14  WBC 11.1 11.6 7.6  HGB 9.6* 10.9* 11.1*  HCT 28* 32* 32*  PLT 256 265 233   Lipid Panel: No results for input(s): CHOL, HDL, LDLCALC, TRIG, CHOLHDL, LDLDIRECT in the last 8760 hours.  Past Procedures:  None recently  Assessment/Plan Diabetes mellitus with neuropathy Hgb A1c 6.0 08/15/13 05/15/14 Hgb A1c 6.0 11/20/14 Hgb 5.3 Continue Metformin 500mg  bid and Glipizide 5mg  bid.    Constipation Stable, continue Miralax daily and Senokot S II qod     Hypothyroidism TSH 1.386 09/12/13 06/07/14 TSH 1.302 Takes Levothyroxine 81mcg po daily.      Reflux esophagitis diet modified to Nectar thick liquid and Purred diet. Better with Omeprazole 20mg  daily and off Carafate bid.       Dementia, in, senility 11/13/14 MMSE 16/30, takes Namenda and resides SNF for care needs.      Major depressive  disorder, single episode better with Prozac 40mg  since 05/01/13 and off prn Ativan 0.5mg  q8hr     Neuralgia and neuritis Pain is managed with Lyrica 75mg  nightly. Mainly in her left leg.      Edema Left  ankle> the right, chronic and trace, no significant weight change        Family/ Staff Communication: observe the patient  Goals of Care: SNF  Labs/tests ordered: none

## 2014-12-24 ENCOUNTER — Other Ambulatory Visit: Payer: Self-pay | Admitting: Nurse Practitioner

## 2014-12-24 DIAGNOSIS — M792 Neuralgia and neuritis, unspecified: Secondary | ICD-10-CM

## 2015-01-02 ENCOUNTER — Other Ambulatory Visit: Payer: Self-pay

## 2015-01-02 MED ORDER — MORPHINE SULFATE (CONCENTRATE) 20 MG/ML PO SOLN: ORAL | Status: AC

## 2015-01-04 DEATH — deceased
